# Patient Record
Sex: Male | Born: 1980 | Race: White | Hispanic: No | Marital: Single | Smoking: Current every day smoker
Health system: Southern US, Community
[De-identification: ages and names within clinical notes are randomized; demographics above are authoritative.]

## PROBLEM LIST (undated history)

## (undated) DIAGNOSIS — M549 Dorsalgia, unspecified: Secondary | ICD-10-CM

## (undated) DIAGNOSIS — R569 Unspecified convulsions: Secondary | ICD-10-CM

## (undated) DIAGNOSIS — K259 Gastric ulcer, unspecified as acute or chronic, without hemorrhage or perforation: Secondary | ICD-10-CM

## (undated) DIAGNOSIS — N200 Calculus of kidney: Secondary | ICD-10-CM

## (undated) DIAGNOSIS — F1121 Opioid dependence, in remission: Secondary | ICD-10-CM

## (undated) DIAGNOSIS — G8929 Other chronic pain: Secondary | ICD-10-CM

## (undated) DIAGNOSIS — F419 Anxiety disorder, unspecified: Secondary | ICD-10-CM

## (undated) DIAGNOSIS — F32A Depression, unspecified: Secondary | ICD-10-CM

## (undated) DIAGNOSIS — B1920 Unspecified viral hepatitis C without hepatic coma: Secondary | ICD-10-CM

## (undated) DIAGNOSIS — M25569 Pain in unspecified knee: Secondary | ICD-10-CM

## (undated) DIAGNOSIS — F329 Major depressive disorder, single episode, unspecified: Secondary | ICD-10-CM

## (undated) DIAGNOSIS — F1910 Other psychoactive substance abuse, uncomplicated: Secondary | ICD-10-CM

## (undated) DIAGNOSIS — G2581 Restless legs syndrome: Secondary | ICD-10-CM

## (undated) HISTORY — DX: Other psychoactive substance abuse, uncomplicated: F19.10

## (undated) HISTORY — DX: Restless legs syndrome: G25.81

## (undated) HISTORY — DX: Opioid dependence, in remission: F11.21

## (undated) HISTORY — DX: Depression, unspecified: F32.A

## (undated) HISTORY — DX: Anxiety disorder, unspecified: F41.9

## (undated) HISTORY — PX: WISDOM TOOTH EXTRACTION: SHX21

## (undated) HISTORY — DX: Major depressive disorder, single episode, unspecified: F32.9

## (undated) HISTORY — DX: Dorsalgia, unspecified: M54.9

## (undated) HISTORY — PX: HERNIA REPAIR: SHX51

---

## 2001-12-10 ENCOUNTER — Emergency Department (HOSPITAL_COMMUNITY): Admission: EM | Admit: 2001-12-10 | Discharge: 2001-12-10 | Payer: Self-pay | Admitting: Internal Medicine

## 2004-10-20 ENCOUNTER — Emergency Department (HOSPITAL_COMMUNITY): Admission: EM | Admit: 2004-10-20 | Discharge: 2004-10-21 | Payer: Self-pay | Admitting: *Deleted

## 2005-09-14 ENCOUNTER — Ambulatory Visit (HOSPITAL_COMMUNITY): Admission: RE | Admit: 2005-09-14 | Discharge: 2005-09-14 | Payer: Self-pay | Admitting: Family Medicine

## 2006-01-27 ENCOUNTER — Emergency Department (HOSPITAL_COMMUNITY): Admission: EM | Admit: 2006-01-27 | Discharge: 2006-01-27 | Payer: Self-pay | Admitting: Emergency Medicine

## 2006-11-08 ENCOUNTER — Emergency Department (HOSPITAL_COMMUNITY): Admission: EM | Admit: 2006-11-08 | Discharge: 2006-11-08 | Payer: Self-pay | Admitting: Emergency Medicine

## 2007-03-21 ENCOUNTER — Emergency Department (HOSPITAL_COMMUNITY): Admission: EM | Admit: 2007-03-21 | Discharge: 2007-03-21 | Payer: Self-pay | Admitting: Emergency Medicine

## 2007-08-13 ENCOUNTER — Emergency Department (HOSPITAL_COMMUNITY): Admission: EM | Admit: 2007-08-13 | Discharge: 2007-08-13 | Payer: Self-pay | Admitting: Emergency Medicine

## 2007-08-13 ENCOUNTER — Ambulatory Visit: Payer: Self-pay | Admitting: Psychiatry

## 2007-08-13 ENCOUNTER — Inpatient Hospital Stay (HOSPITAL_COMMUNITY): Admission: AD | Admit: 2007-08-13 | Discharge: 2007-08-15 | Payer: Self-pay | Admitting: Psychiatry

## 2007-08-16 ENCOUNTER — Emergency Department (HOSPITAL_COMMUNITY): Admission: EM | Admit: 2007-08-16 | Discharge: 2007-08-16 | Payer: Self-pay | Admitting: Emergency Medicine

## 2007-08-18 ENCOUNTER — Emergency Department (HOSPITAL_COMMUNITY): Admission: EM | Admit: 2007-08-18 | Discharge: 2007-08-18 | Payer: Self-pay | Admitting: Emergency Medicine

## 2008-03-10 ENCOUNTER — Emergency Department (HOSPITAL_COMMUNITY): Admission: EM | Admit: 2008-03-10 | Discharge: 2008-03-11 | Payer: Self-pay | Admitting: Emergency Medicine

## 2008-06-27 ENCOUNTER — Emergency Department (HOSPITAL_COMMUNITY): Admission: EM | Admit: 2008-06-27 | Discharge: 2008-06-27 | Payer: Self-pay | Admitting: Emergency Medicine

## 2009-06-14 ENCOUNTER — Emergency Department (HOSPITAL_COMMUNITY): Admission: EM | Admit: 2009-06-14 | Discharge: 2009-06-14 | Payer: Self-pay | Admitting: Emergency Medicine

## 2009-06-16 ENCOUNTER — Emergency Department (HOSPITAL_COMMUNITY): Admission: EM | Admit: 2009-06-16 | Discharge: 2009-06-16 | Payer: Self-pay | Admitting: Emergency Medicine

## 2009-07-15 ENCOUNTER — Emergency Department (HOSPITAL_COMMUNITY): Admission: EM | Admit: 2009-07-15 | Discharge: 2009-07-15 | Payer: Self-pay | Admitting: Emergency Medicine

## 2009-07-23 ENCOUNTER — Ambulatory Visit (HOSPITAL_COMMUNITY): Admission: RE | Admit: 2009-07-23 | Discharge: 2009-07-23 | Payer: Self-pay | Admitting: Family Medicine

## 2010-02-16 ENCOUNTER — Emergency Department (HOSPITAL_COMMUNITY): Admission: EM | Admit: 2010-02-16 | Discharge: 2010-02-16 | Payer: Self-pay | Admitting: Emergency Medicine

## 2010-02-22 ENCOUNTER — Emergency Department (HOSPITAL_COMMUNITY): Admission: EM | Admit: 2010-02-22 | Discharge: 2010-02-22 | Payer: Self-pay | Admitting: Emergency Medicine

## 2010-08-13 LAB — COMPREHENSIVE METABOLIC PANEL
ALT: 11 U/L (ref 0–53)
Albumin: 4 g/dL (ref 3.5–5.2)
Alkaline Phosphatase: 39 U/L (ref 39–117)
BUN: 10 mg/dL (ref 6–23)
CO2: 26 mEq/L (ref 19–32)
Chloride: 102 mEq/L (ref 96–112)
GFR calc non Af Amer: >60 mL/min (ref >60)
Sodium: 134 mEq/L — ABNORMAL LOW (ref 135–145)

## 2010-08-13 LAB — BASIC METABOLIC PANEL
Chloride: 103 mEq/L (ref 96–112)
GFR calc non Af Amer: >60 mL/min (ref >60)
Potassium: 4.6 mEq/L (ref 3.5–5.1)
Sodium: 139 mEq/L (ref 135–145)

## 2010-08-13 LAB — RAPID URINE DRUG SCREEN, HOSP PERFORMED: Benzodiazepines: POSITIVE — AB

## 2010-08-13 LAB — CBC
HCT: 40.5 % (ref 39.0–52.0)
Hemoglobin: 13.6 g/dL (ref 13.0–17.0)
MCH: 33.2 pg (ref 26.0–34.0)
MCV: 97 fL (ref 78.0–100.0)
Platelets: 263 10*3/uL (ref 150–400)
RBC: 4.1 MIL/uL — ABNORMAL LOW (ref 4.22–5.81)
RBC: 4.18 MIL/uL — ABNORMAL LOW (ref 4.22–5.81)
RDW: 12.3 % (ref 11.5–15.5)
WBC: 6.5 10*3/uL (ref 4.0–10.5)

## 2010-08-13 LAB — DIFFERENTIAL
Eosinophils Relative: 2 % (ref 0–5)
Lymphocytes Relative: 35 % (ref 12–46)
Lymphs Abs: 2.2 10*3/uL (ref 0.7–4.0)
Monocytes Relative: 8 % (ref 3–12)
Neutrophils Relative %: 55 % (ref 43–77)

## 2010-08-13 LAB — LIPASE, BLOOD: Lipase: 30 U/L (ref 11–59)

## 2010-08-13 LAB — ETHANOL: Alcohol, Ethyl (B): <5 mg/dL (ref 0–10)

## 2010-10-16 NOTE — Discharge Summary (Signed)
Andre Lynch, Andre Lynch NO.:  [id number]   MEDICAL RECORD NO.:  [id number]          PATIENT TYPE:  IPS   LOCATION:  0501                          FACILITY:  [facility]   PHYSICIAN:  Geoffery Lyons, M.D.      DATE OF BIRTH:  February 23, 1981   DATE OF ADMISSION:  08/13/2007  DATE OF DISCHARGE:  08/15/2007                               DISCHARGE SUMMARY   CHIEF COMPLAINT AND PRESENT ILLNESS:  This was the first admission to  [facility] for this 30 year old married male.  His  mother brought him to the ED after she found him passed out in the  bathroom of their home.  He told mom he felt like harming himself.  An  aunt had died, and he got very depressed, chronic back pain from  physical labor.   PAST PSYCHIATRIC HISTORY:  Time at Behavior Health.  February 2008 was  in [facility].  Has taken Seroquel in the past.   ALCOHOL AND DRUG HISTORY:  As already stated.  Was in [place], [place],  for detox treatment, cocaine and opiates.  He went from methadone to  Suboxone.   MEDICATIONS:  1. Alprazolam.  2. Vicodin.   PHYSICAL EXAMINATION:  Failed to show any acute findings.   LABORATORY WORK:  UDS positive for opiates and benzodiazepines, cocaine,  and marijuana.  CBC within normal limits.   MENTAL STATUS EXAMINATION:  Reveals an alert, cooperative male.  Speech  normal in rate, tempo, and production.  Mood depressed.  Affect  depressed.  Thought processes logical, coherent, and relevant.  No  evidence of delusions.  Endorsed suicidal thoughts but can contract for  safety.  No homicidal ideas.  No hallucinations.  Cognition well-  preserved.   ADMISSION DIAGNOSES:  AXIS I:  Polysubstance dependence, cocaine,  marijuana, heroin; depressive disorder, not otherwise specified.  AXIS II:  No diagnosis.  AXIS III:  No diagnosis.  AXIS IV: Moderate.  AVIS V:  GAF on admission 35, highest GAF in the last year 60.   COURSE IN THE HOSPITAL:  He was admitted,  started in individual and  group psychotherapy, was detoxified with Librium.  He was given some  Seroquel.  As already stated, aunt died, was using Xanax pain  medication, got really depressed, took more Xanax.  Endorsed generalized  anxiety and panic attacks; that is why he is prescribed the Xanax.  Endorsed back pain.  He was apparently on Cymbalta and then Lexapro and  claimed he was not trying to kill himself.  Lives with his wife.  Endorsed a good relationship.  Also has step kids, 51 and 57 years old.  Information from the mother suggested more extensive use than what he  cared to admit; cocaine, marijuana and heroin snorting.  On March 17, he  said that he wanted to leave.  Endorsed that he was now willing to be  open about his substance use.  Endorsed that he really wanted to get his  life back together.  He understands his wife would not immediately want  him back, and  that if he was to go back to his old behavior, the  relationship was going to probably be over.  There was a family session  with mother and wife.  He endorsed that he felt his detox was completed.  Was going to follow up in [facility].  Endorsed he plans to be compliant with medication.  Mother was clear  that this was the last time that she was going to take him in.  Mother  and wife were supportive of the fact that he needed to earn their trust,  as they did not trust him anymore, but overall, they seemed to be  supportive of his recovery.  He was in full contact with reality, no  active withdrawal, back on medications, so we went ahead and discharged  to outpatient follow-up.   DISCHARGE DIAGNOSES:  AXIS I:  Cocaine, marijuana, heroin abuse.  Depressive disorder, not otherwise specified.  Anxiety disorder, not  otherwise specified.  AXIS II:  No diagnosis.  AXIS III:  Chronic back pain.  AXIS IV:  Moderate.  AXIS V:  On discharge 55-60.   Discharged on Robaxin 750 mg 1 to 4 times  a day, ibuprofen 100 mg 3  times a day with food, DuoDerm patch 5% applied for 12 hours, and  Seroquel 200 three times a day.   FOLLOW-UP:  [facility].      Geoffery Lyons, M.D.  Electronically Signed     IL/MEDQ  D:  09/12/2007  T:  09/12/2007  Job:  [id number]

## 2011-02-22 LAB — BASIC METABOLIC PANEL
BUN: 10
BUN: 13
CO2: 28
CO2: 30
Calcium: 9.7
Chloride: 105
Creatinine, Ser: 1.1
GFR calc Af Amer: >60
GFR calc non Af Amer: >60
Glucose, Bld: 111 — ABNORMAL HIGH
Sodium: 140

## 2011-02-22 LAB — DIFFERENTIAL
Eosinophils Absolute: 0.2
Eosinophils Relative: 2
Lymphocytes Relative: 20
Lymphs Abs: 2.1
Lymphs Abs: 2.6
Monocytes Absolute: 0.8
Monocytes Relative: 7
Neutro Abs: 3.6
Neutrophils Relative %: 52

## 2011-02-22 LAB — HEPATIC FUNCTION PANEL
AST: 16
Albumin: 4
Bilirubin, Direct: 0.1
Total Protein: 6.9

## 2011-02-22 LAB — ETHANOL: Alcohol, Ethyl (B): <5

## 2011-02-22 LAB — RAPID URINE DRUG SCREEN, HOSP PERFORMED
Amphetamines: NOT DETECTED
Amphetamines: NOT DETECTED
Barbiturates: NOT DETECTED
Barbiturates: NOT DETECTED
Benzodiazepines: POSITIVE — AB
Cocaine: POSITIVE — AB
Opiates: POSITIVE — AB
Tetrahydrocannabinol: NOT DETECTED
Tetrahydrocannabinol: POSITIVE — AB

## 2011-02-22 LAB — CBC
HCT: 40.9
Hemoglobin: 14.2
MCHC: 34.8
MCV: 100.6 — ABNORMAL HIGH
MCV: 99.1
Platelets: 226
Platelets: 259
RBC: 4.07 — ABNORMAL LOW
RBC: 4.27
RDW: 12.3
WBC: 10.2
WBC: 6.9

## 2011-02-22 LAB — ACETAMINOPHEN LEVEL: Acetaminophen (Tylenol), Serum: <10 — ABNORMAL LOW

## 2011-06-13 ENCOUNTER — Ambulatory Visit: Payer: Self-pay

## 2011-06-13 DIAGNOSIS — R55 Syncope and collapse: Secondary | ICD-10-CM

## 2011-06-13 DIAGNOSIS — F411 Generalized anxiety disorder: Secondary | ICD-10-CM

## 2011-11-19 ENCOUNTER — Other Ambulatory Visit: Payer: Self-pay | Admitting: Family Medicine

## 2011-12-15 ENCOUNTER — Other Ambulatory Visit: Payer: Self-pay | Admitting: Family Medicine

## 2012-01-01 ENCOUNTER — Emergency Department (HOSPITAL_COMMUNITY)
Admission: EM | Admit: 2012-01-01 | Discharge: 2012-01-01 | Disposition: A | Discharge status: Home / Self Care | Payer: Self-pay | Attending: Emergency Medicine | Admitting: Emergency Medicine

## 2012-01-01 ENCOUNTER — Encounter (HOSPITAL_COMMUNITY): Payer: Self-pay | Admitting: *Deleted

## 2012-01-01 DIAGNOSIS — F17200 Nicotine dependence, unspecified, uncomplicated: Secondary | ICD-10-CM | POA: Insufficient documentation

## 2012-01-01 DIAGNOSIS — R229 Localized swelling, mass and lump, unspecified: Secondary | ICD-10-CM | POA: Insufficient documentation

## 2012-01-01 DIAGNOSIS — M25539 Pain in unspecified wrist: Secondary | ICD-10-CM

## 2012-01-01 DIAGNOSIS — M6740 Ganglion, unspecified site: Secondary | ICD-10-CM

## 2012-01-01 MED ORDER — OXYCODONE-ACETAMINOPHEN 5-325 MG PO TABS: 1.0000 | ORAL_TABLET | ORAL | Status: AC | PRN

## 2012-01-01 MED ORDER — LIDOCAINE HCL (PF) 1 % IJ SOLN
5.0000 mL | Freq: Once | INTRAMUSCULAR | Status: AC
  Administered 2012-01-01: 19:00:00

## 2012-01-01 MED ORDER — LIDOCAINE HCL (PF) 1 % IJ SOLN
INTRAMUSCULAR | Status: AC
  Filled 2012-01-01: qty 5

## 2012-01-01 NOTE — ED Notes (Signed)
Left wrist fx, seen at urgent care. Abscess to left hand also which urgent care doctor told pt it may need to be lanced. NAD.

## 2012-01-01 NOTE — ED Provider Notes (Signed)
History     CSN: [id number]  Arrival date & time 01/01/12  0865   First MD Initiated Contact with Patient 01/01/12 1834      Chief Complaint  Patient presents with  . Wrist Pain    (Consider location/radiation/quality/duration/timing/severity/associated sxs/prior treatment) HPI Comments: Patient c/o pain to his left wrist for one week.  States the pain began when a lawn mower he was working on slipped and fell on his wrist.  C/o pain to the medial wrist and also had a small , firm nodule appear on his left hand at the same time as the injury.  States he was seen at a local urgent are and told he had a wrist fracture and the nodule was likely an abscess.  States he has been taking doxycycline and Relafen but symptoms are not improving.  He denies swelling of his hand, fever, chills, numbness or weakness.    Patient is a 31 y.o. male presenting with wrist pain. The history is provided by the patient.  Wrist Pain This is a new problem. The current episode started in the past 7 days. The problem occurs constantly. The problem has been gradually worsening. Associated symptoms include arthralgias. Pertinent negatives include no chills, fever, joint swelling, neck pain, numbness, rash, sore throat or weakness. The symptoms are aggravated by bending. He has tried NSAIDs (splinting) for the symptoms. The treatment provided no relief.    History reviewed. No pertinent past medical history.  Past Surgical History  Procedure Date  . Hernia repair     No family history on file.  History  Substance Use Topics  . Smoking status: Current Everyday Smoker  . Smokeless tobacco: Not on file  . Alcohol Use: Yes     rarely      Review of Systems  Constitutional: Negative for fever and chills.  HENT: Negative for sore throat and neck pain.   Genitourinary: Negative for dysuria and difficulty urinating.  Musculoskeletal: Positive for arthralgias. Negative for joint swelling.       Left wrist  pain  Skin: Negative for color change and rash.       Nodule to the left hand  Neurological: Negative for weakness and numbness.  All other systems reviewed and are negative.    Allergies  Review of patient's allergies indicates no known allergies.  Home Medications   Current Outpatient Rx  Name Route Sig Dispense Refill  . ALPRAZOLAM 1 MG PO TABS  TAKE (1) TABLET BY MOUTH (3) TIMES DAILY. 90 tablet 0  . KLONOPIN 1 MG PO TABS  TAKE (1) TABLET BY MOUTH AT BEDTIME. 30 each 0    BP 124/73  Pulse 67  Temp 97.8 F (36.6 C) (Oral)  Resp 16  SpO2 100%  Physical Exam  Nursing note and vitals reviewed. Constitutional: He is oriented to person, place, and time. He appears well-developed and well-nourished. No distress.  HENT:  Head: Normocephalic and atraumatic.  Cardiovascular: Normal rate, regular rhythm and normal heart sounds.   Pulmonary/Chest: Effort normal and breath sounds normal.  Musculoskeletal: He exhibits tenderness. He exhibits no edema.       Left wrist: He exhibits decreased range of motion, tenderness and bony tenderness. He exhibits no swelling, no effusion, no crepitus, no deformity and no laceration.       Arms:      ttp of the medial left wrist.  Radial pulse is brisk, sensation intact.  CR< 2 sec.  No bruising or deformity of the wrist.  Pt also has a 2 cm firm, well defined nodule to the dorsal left hand overlying the tendon of the fourth finger.  No fluctuance, surrounding erythema or drainage.   Neurological: He is alert and oriented to person, place, and time. He exhibits normal muscle tone. Coordination normal.  Skin: Skin is warm and dry.    ED Course  Procedures (including critical care time)  Labs Reviewed - No data to display      MDM  Nodule was further evaluated, dorsal left hand was cleaned with saline and betadine.  Local anesthesia obtained with 1% plain lidocaine, 1ml.  Needle aspiration with an 18g needle, no aspirate .  Bandaged applied.      No recent imaging in EPIC.  Firm slightly erythematous nodule to the dorsal left hand, likely a ganglion cyst.  Pt prefers to f/u with Dr. Hilda Lias.  Has a velcro wrist splint with him.   Pt was also seen by the EDP and care plan discussed.    The patient appears reasonably screened and/or stabilized for discharge and I doubt any other medical condition or other [facility] requiring further screening, evaluation, or treatment in the ED at this time prior to discharge.      Anabelen Kaminsky L. [place], [place] 01/01/12 1944

## 2012-01-01 NOTE — ED Notes (Signed)
Pt with knot to back of left hand, states it came up after a lawnmower fell on his hand and left wrist, was seen at Urgent Care this past Tuesday and was dx with left wrist fx, swelling noted to medial left wrist

## 2012-01-01 NOTE — ED Notes (Signed)
T. Triplett, PA at bedside. 

## 2012-01-02 NOTE — ED Provider Notes (Signed)
Medical screening examination/treatment/procedure(s) were performed by non-physician practitioner and as supervising physician I was immediately available for consultation/collaboration.  Shaquisha Wynn R. Mekiyah Gladwell, MD 01/02/12 2359 

## 2012-01-03 MED FILL — Oxycodone w/ Acetaminophen Tab 5-325 MG: ORAL | Qty: 6 | Status: AC

## 2012-01-15 ENCOUNTER — Other Ambulatory Visit: Payer: Self-pay | Admitting: *Deleted

## 2012-01-15 MED ORDER — ALPRAZOLAM 1 MG PO TABS: 1.0000 mg | ORAL_TABLET | Freq: Three times a day (TID) | ORAL | Status: DC

## 2012-02-11 ENCOUNTER — Other Ambulatory Visit: Payer: Self-pay | Admitting: Physician Assistant

## 2012-02-11 NOTE — Telephone Encounter (Signed)
Please pull chart.

## 2012-02-11 NOTE — Telephone Encounter (Signed)
Patient's chart is at the nurses station in the pa pool pile. [id number] [id number]

## 2012-03-17 ENCOUNTER — Ambulatory Visit: Payer: Self-pay | Admitting: Physician Assistant

## 2012-03-17 VITALS — BP 122/78 | HR 70 | Temp 97.8°F | Resp 16 | Ht 68.5 in | Wt 130.6 lb

## 2012-03-17 DIAGNOSIS — F419 Anxiety disorder, unspecified: Secondary | ICD-10-CM

## 2012-03-17 DIAGNOSIS — F411 Generalized anxiety disorder: Secondary | ICD-10-CM

## 2012-03-17 MED ORDER — ALPRAZOLAM 1 MG PO TABS: 1.0000 mg | ORAL_TABLET | Freq: Three times a day (TID) | ORAL | Status: DC | PRN

## 2012-03-17 NOTE — Progress Notes (Signed)
  Subjective:    Patient ID: Andre Lynch, male    DOB: 22-Feb-1981, 31 y.o.   MRN: [id number]  HPI 31 year old male presents for recheck of anxiety.  He is taking Xanax tid prn anxiety. States this is working well and he is happy with the results.  Does admit he has taken multiple anti-depressants in the past, including Wellbutrin, Prozac, Celexa, and Zoloft. None of these have helped him at all, in fact they have made him feel "weird" and "not himself."  Wishes to stay on this regimen.  Understands that he needs to come in every 6 months for rechecks. 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  Thankful to have a job but does admit to it causing him anxiety.     Review of Systems  Psychiatric/Behavioral: Negative for suicidal ideas, hallucinations, behavioral problems, confusion and agitation. The patient is nervous/anxious.   All other systems reviewed and are negative.       Objective:   Physical Exam  Constitutional: He is oriented to person, place, and time. He appears well-developed and well-nourished.  HENT:  Head: Normocephalic and atraumatic.  Right Ear: External ear normal.  Left Ear: External ear normal.  Eyes: Conjunctivae normal are normal.  Neck: Normal range of motion.  Cardiovascular: Normal rate, regular rhythm and normal heart sounds.   Pulmonary/Chest: Effort normal and breath sounds normal.  Neurological: He is alert and oriented to person, place, and time.  Psychiatric: He has a normal mood and affect. His behavior is normal. Judgment and thought content normal.          Assessment & Plan:   1. Anxiety    Xanax 1 mg tid prn. Encouraged to use as sparingly as possible Follow up in 6 months.

## 2012-04-22 ENCOUNTER — Telehealth: Payer: Self-pay

## 2012-04-22 NOTE — Telephone Encounter (Signed)
Pt states that pharmacy has tried to order a refill on xanax, but has not heard back. Pt is requesting refill for xanax.  Sorry, but i forgot to get the pharmacy name.  [phone]

## 2012-04-23 NOTE — Telephone Encounter (Signed)
Last time, he used [address] in [place].

## 2012-04-24 MED ORDER — ALPRAZOLAM 1 MG PO TABS: 1.0000 mg | ORAL_TABLET | Freq: Three times a day (TID) | ORAL | Status: DC | PRN

## 2012-04-24 NOTE — Telephone Encounter (Signed)
Faxed Rx to pharmacy and notified pt.

## 2012-04-24 NOTE — Telephone Encounter (Signed)
Rx done and ready to be sent to pharmacy 

## 2012-05-18 ENCOUNTER — Telehealth: Payer: Self-pay | Admitting: *Deleted

## 2012-05-18 MED ORDER — ALPRAZOLAM 1 MG PO TABS: 1.0000 mg | ORAL_TABLET | Freq: Three times a day (TID) | ORAL | Status: DC | PRN

## 2012-05-18 NOTE — Telephone Encounter (Signed)
Rx done. 

## 2012-05-18 NOTE — Telephone Encounter (Signed)
Pharmacy requesting a refill on Xanax 1mg  tab. Last refill on 04/24/12.

## 2012-05-19 ENCOUNTER — Other Ambulatory Visit: Payer: Self-pay

## 2012-06-26 ENCOUNTER — Telehealth: Payer: Self-pay

## 2012-06-26 NOTE — Telephone Encounter (Signed)
[facility] requesting refill on alprazolam 1mg . Last fill 05/23/12.

## 2012-06-26 NOTE — Telephone Encounter (Signed)
Pharmacist CB to get approval for RF.  Herbert Seta do you want to RF pt's xanax? I have pended it for your approval.

## 2012-06-26 NOTE — Telephone Encounter (Signed)
Patient is checking on status of his xanax refills   [phone]

## 2012-06-27 MED ORDER — ALPRAZOLAM 1 MG PO TABS: 1.0000 mg | ORAL_TABLET | Freq: Three times a day (TID) | ORAL | Status: DC | PRN

## 2012-06-27 NOTE — Telephone Encounter (Signed)
Rx done and ready to fax.

## 2012-06-28 NOTE — Telephone Encounter (Signed)
PT NOTIFIED THAT RX WAS FAXED TO [facility]

## 2012-07-03 ENCOUNTER — Telehealth: Payer: Self-pay | Admitting: Radiology

## 2012-07-03 NOTE — Telephone Encounter (Signed)
[facility] [place] advised on this at least since 2010. Called Andre Lynch. FYI/ patient is in detox program for this.

## 2012-07-03 NOTE — Telephone Encounter (Signed)
Detox facility is asking how long patient has been taking the Alprazolam. Call [phone] Jasmine December. Detox nurses station.

## 2012-07-27 ENCOUNTER — Ambulatory Visit: Payer: Self-pay | Admitting: Family Medicine

## 2012-07-27 ENCOUNTER — Encounter: Payer: Self-pay | Admitting: Physician Assistant

## 2012-07-27 VITALS — BP 117/69 | HR 81 | Temp 98.0°F | Resp 16 | Ht 69.0 in | Wt 142.0 lb

## 2012-07-27 DIAGNOSIS — F1911 Other psychoactive substance abuse, in remission: Secondary | ICD-10-CM

## 2012-07-27 DIAGNOSIS — F419 Anxiety disorder, unspecified: Secondary | ICD-10-CM

## 2012-07-27 DIAGNOSIS — F411 Generalized anxiety disorder: Secondary | ICD-10-CM

## 2012-07-27 MED ORDER — ALPRAZOLAM 1 MG PO TABS: 1.0000 mg | ORAL_TABLET | Freq: Three times a day (TID) | ORAL | Status: DC | PRN

## 2012-07-27 NOTE — Progress Notes (Signed)
Subjective:    Patient ID: Andre Lynch, male    DOB: March 21, 1981, 32 y.o.   MRN: [id number]  HPI  A 32 year old male presents for follow up of anxiety and refill of xanax.    Pt was discharged from [facility] [place] yesterday where he spent the past 28 days in a detox program for opioids.  [place] discharge papers show the patient taking METHADONE, klonopin, and trazadone while there.  Pt states he took METHADONE consistently but klonopin and trazadone only as needed.  He reports the clonazepam wasn't helpful at all and the trazadone only minimally so.  Pt has daily support groups he attends for addiction and has a plan in place incase he feels the urge to relapse.  His discharge papers indicate an appointment at [facility] today at 5 am for Methadone, and 2 pm today here.He reports that upon his arrival at [facility] this morning, but he was not seen and did not get methadone because there was no provider there.  [facility] is unable to see him until August 01, 2012. He's wondering if a pain management clinic would be a better choice for him, and asks for a referral.  Pt has taken "xanax 1 mg TID for the last 6-7 years" which relieves his anxiety and gives him no side effects.  He has tried several anxiolytics in the past but discontinued them due to side effects (buspar, clonazepam, trazadone, paxil, elavil).  Pt is able to function normally at work and does not feel drowsy throughout the day.  He denies trouble sleeping, SOB, CP, n/v, f/c.  [place] Controlled substance database is aligned with the patient's report.  His record here, both paper and electronic is reviewed.    Past Medical History  Diagnosis Date  . History of narcotic addiction     methadone tx  . Back pain   . RLS (restless legs syndrome)     Past Surgical History  Procedure Laterality Date  . Hernia repair    . Wisdom tooth extraction      Prior to Admission medications : Methadone (last dose  yesterday)    No Known Allergies  History   Social History  . Lives alone    Occupational History  . Licensed conveyancer; Farm[employer]   Social History Main Topics  . Smoking status: Current Every Day Smoker  . Alcohol Use: Yes     Comment: rarely  . Drug Use: Not currently   Family History  Problem Relation Age of Onset  . Cancer Mother     breast cancer survivor    Review of Systems   As above.  Objective:   Physical Exam  BP 117/69  Pulse 81  Temp(Src) 98 F (36.7 C)  Resp 16  Ht 5\' 9"  (1.753 m)  Wt 142 lb (64.411 kg)  BMI 20.96 kg/m2  General:  Pleasant, thin male.  NAD. Accompained by a male friend. HEENT:  PERRLA, EOMs intact.  Conjunctiva clear. Peripheral vascular:  2+ bilateral radial pulses. MSK:  2+ bilateral BR, bicep, patellar reflexes. Neuro:  A&Ox3.  Cranial nerves intact. Psych:  Normal mood and affect. Heart:  RRR.  Normal S1,S2.  No m/g/r. Lungs:  CTAB. Skin:  Warm and moist.  No rashes.     Assessment & Plan:  Anxiety - Plan: ALPRAZolam (XANAX) 1 MG tablet  Patient Instructions  Continue to go to support group meetings.  You can call in 1 month for a refill  of your xanax, but you will need to follow up with [place] in 2 months.  Contact us with the name of the pain clinic you would like [place] to refer you to.

## 2012-07-27 NOTE — Patient Instructions (Signed)
Continue to go to support group meetings.  You can call in 1 month for a refill of your xanax, but you will need to follow up with [place] in 2 months.  Contact us with the name of the pain clinic you would like [place] to refer you to.

## 2012-07-28 ENCOUNTER — Emergency Department (HOSPITAL_COMMUNITY)
Admission: EM | Admit: 2012-07-28 | Discharge: 2012-07-28 | Disposition: A | Discharge status: Home / Self Care | Payer: Self-pay

## 2012-07-28 ENCOUNTER — Telehealth: Payer: Self-pay

## 2012-07-28 ENCOUNTER — Encounter: Payer: Self-pay | Admitting: Family Medicine

## 2012-07-28 DIAGNOSIS — F1911 Other psychoactive substance abuse, in remission: Secondary | ICD-10-CM | POA: Insufficient documentation

## 2012-07-28 DIAGNOSIS — F411 Generalized anxiety disorder: Secondary | ICD-10-CM | POA: Insufficient documentation

## 2012-07-28 NOTE — Progress Notes (Signed)
I have examined this patient along with the student and agree.  

## 2012-07-28 NOTE — Telephone Encounter (Signed)
PT WAS SEEN TODAY BY CHELLE AND SHE KEPT SOME PAPERWORK THAT PATIENT NEEDS FAXED BACK TO [phone] A DISCHARGE SUMMARY  BEST NUMBER IS [phone]

## 2012-07-28 NOTE — ED Notes (Signed)
No answer

## 2012-07-31 NOTE — Telephone Encounter (Signed)
He needs his dosing schedule for his Methadone. He states he gave it to the doctor now he needs this. Please advise.

## 2012-07-31 NOTE — Telephone Encounter (Signed)
Will forward message to [employer], PA-C.

## 2012-08-01 NOTE — Telephone Encounter (Signed)
I do not have these. His voice mail is not set up yet unable to leave message.

## 2012-08-01 NOTE — Telephone Encounter (Signed)
The papers he gave me were marked for scanning.  Please pursue.  Copy the papers for [place] to scan, and return the originals to the patient.  If we are unable to locate them, please call the center where he received his treatment in [place] and ask for a copy.

## 2012-08-02 NOTE — Telephone Encounter (Signed)
Voice mail box  Not set up yet. Have looked for these, unable to locate. He should check with [place] treatment center to get this information

## 2012-08-24 ENCOUNTER — Telehealth: Payer: Self-pay

## 2012-08-24 DIAGNOSIS — F419 Anxiety disorder, unspecified: Secondary | ICD-10-CM

## 2012-08-24 NOTE — Telephone Encounter (Signed)
PATIENT WOULD LIKE TO KNOW IF A XANAX RX REQUEST  HAS COME TO OUR OFFICE. PATIENT USES [facility] IN [place].   BEST CALL BACK #: [phone]

## 2012-08-24 NOTE — Telephone Encounter (Signed)
Patient evaluated by Chelle Jeffery, PA-C. Will forward message. 

## 2012-08-24 NOTE — Telephone Encounter (Signed)
Please advise on renewal of Xanax. pended

## 2012-08-25 MED ORDER — ALPRAZOLAM 1 MG PO TABS: 1.0000 mg | ORAL_TABLET | Freq: Three times a day (TID) | ORAL | Status: DC | PRN

## 2012-08-25 NOTE — Telephone Encounter (Signed)
Called patient to advise. Patient states he is going to methadone clinic, [facility]. He states he does not want to pursue pain management clinic at this time, he states he is getting comfortable with his methadone clinic first, with getting dose to where he feels like it needs to be, then he states he will try to get into pain clinic. He plans to come in to discuss this in next month.

## 2012-08-25 NOTE — Telephone Encounter (Signed)
rx printed.  He was to contact us with the name of the pain management center he preferred, but I don't see that he did.  Is he going to the methadone clinic? Also, remind him he'll need an OV for the next prescription.

## 2012-09-22 ENCOUNTER — Telehealth: Payer: Self-pay

## 2012-09-22 NOTE — Telephone Encounter (Signed)
No, he needs office visit for refills, per Chelle. Called him to advise. Voice mail not set up yet.

## 2012-09-22 NOTE — Telephone Encounter (Signed)
Patient is requesting refill on his xanex if possible [facility] [place] please call patient if needed at [phone]

## 2012-09-22 NOTE — Telephone Encounter (Signed)
Pt aware he will need to come into the office for a refill. He states he is now out of state and will go to a local Urgent care for his medication. He is in the process of getting set up with a local dr.

## 2012-10-03 ENCOUNTER — Telehealth: Payer: Self-pay

## 2012-10-03 NOTE — Telephone Encounter (Signed)
Patient seen 07/2012 by Porfirio Oar, PA-C.  Will forward message to her.

## 2012-10-03 NOTE — Telephone Encounter (Signed)
1. If he has had a seizure, he needs to go to the Emergency Department where he is. We have never evaluated him for, nor treated him for, a seizure disorder. I prescribed alprazolam for generalized anxiety disorder.  He has been seen here ONE time, in February 2014.  He was told he could call for a refill of alprazolam x 1 (which he did) and that he'd need an OV for additional evaluation.  He was advised of same x 2 (when he called for the refill in 07/2012 and again when he called to request a refill last month).  He was to be going to the methadone clinic ([facility]) for substance abuse treatment.  2. Where does he live now?  3. Why is he not able to find a physician where he is?

## 2012-10-03 NOTE — Telephone Encounter (Signed)
Pt has moved and can not get a physician where he lives and needing to talk with dr Katrinka Blazing about his seizure medication and has had a seizure  Best number [phone]

## 2012-10-03 NOTE — Telephone Encounter (Signed)
See phone messages, patient has been advised multiple times to return to clinic for his Alprazolam rx.

## 2012-10-04 ENCOUNTER — Telehealth: Payer: Self-pay

## 2012-10-04 NOTE — Telephone Encounter (Signed)
See phone message from yesterday 

## 2012-10-04 NOTE — Telephone Encounter (Signed)
PATIENT WANTS REFILL XANAX AND HAS MOVED TO [place]. [phone]

## 2012-10-04 NOTE — Telephone Encounter (Signed)
PATIENT WANTS REFILL XANAX AND HAS MOVED TO [place]. [phone]   Called at new number given. He states he is living in [place]. He now states he did not have a siezure. He states he had bad panic attack. He states he can not find a doctor. He states he is going to try to find one in [place]. He is advised we can not renew the Xanax. To you FYI

## 2012-10-04 NOTE — Telephone Encounter (Signed)
Called him to advise. No answer voice mail not set up.

## 2012-10-13 ENCOUNTER — Telehealth: Payer: Self-pay

## 2012-10-13 NOTE — Telephone Encounter (Signed)
Not without office visit. Called him to advise. To ER for seizures, he stated last week he had one, was advised to go to ER

## 2012-10-13 NOTE — Telephone Encounter (Signed)
PT says he is out of his xanax.  He does not have insurance and will not get paid until next Thursday.  Says he has seizures without his xanax.  Can we call him in a week's supply? [phone]

## 2012-11-05 ENCOUNTER — Ambulatory Visit: Payer: Self-pay

## 2012-11-06 ENCOUNTER — Emergency Department (HOSPITAL_COMMUNITY)
Admission: EM | Admit: 2012-11-06 | Discharge: 2012-11-06 | Discharge status: Against Medical Advice | Payer: Self-pay | Attending: Emergency Medicine | Admitting: Emergency Medicine

## 2012-11-06 ENCOUNTER — Emergency Department (HOSPITAL_COMMUNITY): Payer: Self-pay

## 2012-11-06 ENCOUNTER — Encounter (HOSPITAL_COMMUNITY): Payer: Self-pay | Admitting: Emergency Medicine

## 2012-11-06 DIAGNOSIS — M549 Dorsalgia, unspecified: Secondary | ICD-10-CM

## 2012-11-06 DIAGNOSIS — Z79899 Other long term (current) drug therapy: Secondary | ICD-10-CM | POA: Insufficient documentation

## 2012-11-06 DIAGNOSIS — IMO0002 Reserved for concepts with insufficient information to code with codable children: Secondary | ICD-10-CM | POA: Insufficient documentation

## 2012-11-06 DIAGNOSIS — M542 Cervicalgia: Secondary | ICD-10-CM

## 2012-11-06 DIAGNOSIS — Y9389 Activity, other specified: Secondary | ICD-10-CM | POA: Insufficient documentation

## 2012-11-06 DIAGNOSIS — S0993XA Unspecified injury of face, initial encounter: Secondary | ICD-10-CM | POA: Insufficient documentation

## 2012-11-06 DIAGNOSIS — R32 Unspecified urinary incontinence: Secondary | ICD-10-CM | POA: Insufficient documentation

## 2012-11-06 DIAGNOSIS — R569 Unspecified convulsions: Secondary | ICD-10-CM | POA: Insufficient documentation

## 2012-11-06 DIAGNOSIS — F1911 Other psychoactive substance abuse, in remission: Secondary | ICD-10-CM

## 2012-11-06 DIAGNOSIS — W19XXXA Unspecified fall, initial encounter: Secondary | ICD-10-CM

## 2012-11-06 DIAGNOSIS — F17200 Nicotine dependence, unspecified, uncomplicated: Secondary | ICD-10-CM | POA: Insufficient documentation

## 2012-11-06 DIAGNOSIS — Y92009 Unspecified place in unspecified non-institutional (private) residence as the place of occurrence of the external cause: Secondary | ICD-10-CM | POA: Insufficient documentation

## 2012-11-06 DIAGNOSIS — W108XXA Fall (on) (from) other stairs and steps, initial encounter: Secondary | ICD-10-CM | POA: Insufficient documentation

## 2012-11-06 DIAGNOSIS — Z8669 Personal history of other diseases of the nervous system and sense organs: Secondary | ICD-10-CM | POA: Insufficient documentation

## 2012-11-06 HISTORY — DX: Unspecified convulsions: R56.9

## 2012-11-06 NOTE — ED Provider Notes (Signed)
History     CSN: [id number]  Arrival date & time 11/06/12  2005   First MD Initiated Contact with Patient 11/06/12 2300      Chief Complaint  Patient presents with  . Seizures     HPI Patient reports a history of intermittent seizures.  He's never been seen by neurologist.  Today he states he was at the top of the flight of stairs when he reportedly had a seizure that was tonic-clonic and resolved at home.  He presented emergency department complaining of back and neck pain.  He states he takes anywhere from 15-20 Xanax a week and that these are prescribed for him.  He states is currently been out of his Xanax for one week because of refill issues with his primary care physician.  He denies abdominal pain or chest pain.  No shortness of breath.  No headache.  He denies use of anticoagulants.  His symptoms are mild to moderate in severity.  He reports urinary incontinence without tongue biting.  No other complaints.   Past Medical History  Diagnosis Date  . History of narcotic addiction     methadone tx  . Back pain   . RLS (restless legs syndrome)   . Seizures     Past Surgical History  Procedure Laterality Date  . Hernia repair    . Wisdom tooth extraction      Family History  Problem Relation Age of Onset  . Cancer Mother     breast cancer survivor    History  Substance Use Topics  . Smoking status: Current Every Day Smoker  . Smokeless tobacco: Not on file  . Alcohol Use: Yes     Comment: rarely      Review of Systems  All other systems reviewed and are negative.    Allergies  Ibuprofen and Tylenol  Home Medications   Current Outpatient Rx  Name  Route  Sig  Dispense  Refill  . ALPRAZolam (XANAX) 1 MG tablet   Oral   Take 1 tablet (1 mg total) by mouth 3 (three) times daily as needed for sleep.   90 tablet   0     BP 112/61  Pulse 103  Temp(Src) 98.7 F (37.1 C) (Oral)  Resp 20  Ht 5\' 8"  (1.727 m)  Wt 130 lb (58.968 kg)  BMI 19.77 kg/m2   SpO2 100%  Physical Exam  Nursing note and vitals reviewed. Constitutional: He is oriented to person, place, and time. He appears well-developed and well-nourished.  HENT:  Head: Normocephalic and atraumatic.  No tongue bite marks  Eyes: EOM are normal. Pupils are equal, round, and reactive to light.  Neck: Neck supple.  Mild cervical and paracervical tenderness without cervical step-offs.  C-spine immobilized in cervical collar.  Cardiovascular: Normal rate, regular rhythm, normal heart sounds and intact distal pulses.   Pulmonary/Chest: Effort normal and breath sounds normal. No respiratory distress. He exhibits no tenderness.  Abdominal: Soft. He exhibits no distension. There is no tenderness.  Genitourinary: Rectum normal.  Musculoskeletal: Normal range of motion.  Thoracic and parathoracic tenderness without thoracic step-off.  No lumbar tenderness.  Neurological: He is alert and oriented to person, place, and time.  5/5 strength in major muscle groups of  bilateral upper and lower extremities. Speech normal. No facial asymetry.  Reported ambulatory in the emergency department  Skin: Skin is warm and dry.  Psychiatric: He has a normal mood and affect. Judgment normal.    ED Course  Procedures (including critical care time)  Labs Reviewed - No data to display No results found.   1. Seizure   2. H/O: substance abuse   3. Neck pain   4. Back pain   5. Fall, initial encounter       MDM  Cervical and thoracic films were ordered.  The patient's pain medications were ordered.  The patient was noted by nursing staff to remove his own cervical collar and walk out of the emergency department without informing staff.  I did not have a chance to give the patient his AMA instructions        Lyanne Co, MD 11/07/12 [phone]

## 2012-11-06 NOTE — ED Notes (Signed)
Pt left AMA, did not sign

## 2012-11-06 NOTE — ED Notes (Signed)
Patient states he had a seizure about 45 minutes ago and fell down a flight of stairs. Complaining of back and right knee pain. States he has a history of seizures but is out of his medication x 1 week. Patient alert and oriented at triage.

## 2012-11-06 NOTE — ED Notes (Addendum)
Pt states he has been on xanax TID for 10 years for seizure controll, has been out of med for 1 week

## 2012-11-06 NOTE — ED Notes (Signed)
Pt witnessed leaving ER after EDP informed him he would not receive narcotics or benzodiazepines.

## 2012-12-31 ENCOUNTER — Ambulatory Visit: Payer: Self-pay | Admitting: Family Medicine

## 2012-12-31 VITALS — BP 102/84 | HR 78 | Temp 98.6°F | Resp 16 | Ht 68.5 in | Wt 136.0 lb

## 2012-12-31 DIAGNOSIS — F411 Generalized anxiety disorder: Secondary | ICD-10-CM

## 2012-12-31 DIAGNOSIS — Z87898 Personal history of other specified conditions: Secondary | ICD-10-CM

## 2012-12-31 DIAGNOSIS — Z8669 Personal history of other diseases of the nervous system and sense organs: Secondary | ICD-10-CM

## 2012-12-31 DIAGNOSIS — F1911 Other psychoactive substance abuse, in remission: Secondary | ICD-10-CM

## 2012-12-31 NOTE — Progress Notes (Signed)
[facility]:  Office Visit  Chief Complaint:  Chief Complaint  Patient presents with  . Medication Refill    Xanax  . Knee Pain    Right x 2 dys  Chronic    HPI: Andre Lynch is a 32 y.o. male who complains of: 1. Has acute on chronic pain in right knee and has seen ortho Dr. Pearletha Furl , he has had pain and pressure in the middle of the knee x 2 days . Throbs most of the time and has occ sharp pain, it is hard for him to sleep. He took some naproxen and ibupofren without relief. Has shooting pain every once in a while. He works  2. Refill for Xanax, he has been getting it here for several years, he last took Xanax 1 week ago. He has been clean since January, rehab in January.  3.  His last seizure was in February or March, prior to this he had one 1.5 years before that. It was witnessed by his father and the ambulance , his hands would get numb and he had sxs  Patient adamently denies to me that he has had any illicit drugs,  Opiates/pain pills or benzos. Last benzo was last week.   Past Medical History  Diagnosis Date  . History of narcotic addiction     methadone tx  . Back pain   . RLS (restless legs syndrome)   . Seizures   . Substance abuse   . Anxiety   . Depression    Past Surgical History  Procedure Laterality Date  . Hernia repair    . Wisdom tooth extraction     History   Social History  . Marital Status: Divorced    Spouse Name: N/A    Number of Children: N/A  . Years of Education: N/A   Social History Main Topics  . Smoking status: Current Every Day Smoker  . Smokeless tobacco: None  . Alcohol Use: Yes     Comment: rarely  . Drug Use: None  . Sexually Active: None   Other Topics Concern  . None   Social History Narrative  . None   Family History  Problem Relation Age of Onset  . Cancer Mother     breast cancer survivor   Allergies  Allergen Reactions  . Ibuprofen Other (See Comments)    Cannot take due to stomach ulcers   . Tylenol (Acetaminophen) Other (See Comments)    Cannot take due to stomach ulcers   Prior to Admission medications   Medication Sig Start Date End Date Taking? Authorizing Provider  ALPRAZolam Prudy Feeler) 1 MG tablet Take 1 tablet (1 mg total) by mouth 3 (three) times daily as needed for sleep. 08/24/12  Yes Chelle S Jeffery, PA-C     ROS: The patient denies fevers, chills, night sweats, unintentional weight loss, chest pain, palpitations, wheezing, dyspnea on exertion, nausea, vomiting, abdominal pain, dysuria, hematuria, melena, numbness, weakness, or tingling.   All other systems have been reviewed and were otherwise negative with the exception of those mentioned in the HPI and as above.    PHYSICAL EXAM: Filed Vitals:   12/31/12 1542  BP: 102/84  Pulse: 78  Temp: 98.6 F (37 C)  Resp: 16   Filed Vitals:   12/31/12 1542  Height: 5' 8.5" (1.74 m)  Weight: 136 lb (61.689 kg)   Body mass index is 20.38 kg/(m^2).  General: Alert, no acute distress, thin young male HEENT:  Normocephalic,  atraumatic, oropharynx patent.  Cardiovascular:  Regular rate and rhythm, no rubs murmurs or gallops.  No Carotid bruits, radial pulse intact. No pedal edema.  Respiratory: Clear to auscultation bilaterally.  No wheezes, rales, or rhonchi.  No cyanosis, no use of accessory musculature GI: No organomegaly, abdomen is soft and non-tender, positive bowel sounds.  No masses. Skin: No rashes. Neurologic: Facial musculature symmetric. Psychiatric: Patient is appropriate throughout our interaction. Lymphatic: No cervical lymphadenopathy Musculoskeletal: Gait intact.   LABS: Results for orders placed during the hospital encounter of 02/22/10  ETHANOL      Result Value Range   Alcohol, Ethyl (B)    0 - 10 mg/dL   Value: <5            LOWEST DETECTABLE LIMIT FOR     SERUM ALCOHOL IS 5 mg/dL     FOR MEDICAL PURPOSES ONLY  BASIC METABOLIC PANEL      Result Value Range   Sodium 139  135 - 145  mEq/L   Potassium 4.6  3.5 - 5.1 mEq/L   Chloride 103  96 - 112 mEq/L   CO2 30  19 - 32 mEq/L   Glucose, Bld 84  70 - 99 mg/dL   BUN 14  6 - 23 mg/dL   Creatinine, Ser 1.61  0.4 - 1.5 mg/dL   Calcium 9.4  8.4 - 09.6 mg/dL   GFR calc non Af Amer >60  >60 mL/min   GFR calc Af Amer    >60 mL/min   Value: >60            The eGFR has been calculated     using the MDRD equation.     This calculation has not been     validated in all clinical     situations.     eGFR's persistently     <60 mL/min signify     possible Chronic Kidney Disease.  CBC      Result Value Range   WBC 6.5  4.0 - 10.5 K/uL   RBC 4.18 (*) 4.22 - 5.81 MIL/uL   Hemoglobin 13.9  13.0 - 17.0 g/dL   HCT 04.5  40.9 - 81.1 %   MCV 97.0  78.0 - 100.0 fL   MCH 33.2  26.0 - 34.0 pg   MCHC 34.2  30.0 - 36.0 g/dL   RDW 91.4  78.2 - 95.6 %   Platelets 263  150 - 400 K/uL  DRUG SCREEN PANEL, EMERGENCY      Result Value Range   Opiates NONE DETECTED  NONE DETECTED   Cocaine POSITIVE (*) NONE DETECTED   Benzodiazepines POSITIVE (*) NONE DETECTED   Amphetamines NONE DETECTED  NONE DETECTED   Tetrahydrocannabinol NONE DETECTED  NONE DETECTED   Barbiturates    NONE DETECTED   Value: NONE DETECTED            DRUG SCREEN FOR MEDICAL PURPOSES     ONLY.  IF CONFIRMATION IS NEEDED     FOR ANY PURPOSE, NOTIFY LAB     WITHIN 5 DAYS.                LOWEST DETECTABLE LIMITS     FOR URINE DRUG SCREEN     Drug Class       Cutoff (ng/mL)     Amphetamine      1000     Barbiturate      200     Benzodiazepine   200  Tricyclics       300     Opiates          300     Cocaine          300     THC              50     EKG/XRAY:   Primary read interpreted by Dr. Conley Rolls at [facility].   ASSESSMENT/PLAN: Encounter Diagnoses  Name Primary?  . Generalized anxiety disorder Yes  . History of seizures   . History of drug abuse     He consented to a drug screen and was found to be  positive for opiates, benzos and also THC  He said he  may have taken a pain pill for his knee after I told him the results, he consented to me sending the test out to confirm our in house testing I advise him that we will refer him to substance abuse physician and also neurology for his seizures, he was willing for [place] to do this I have told him that we will no longer be prescribing any medicines for him and have discharged him from our practice.  He was not forthcoming with his history and also he tested + for drugs that he stated he was not using. Patient understood but was irate and left without checking out If the send out test if different then I will call him otherwise he has been discharge from this office Go to Er prn    Marvia Troost PHUONG, DO 12/31/2012 4:50 PM

## 2013-01-04 ENCOUNTER — Telehealth: Payer: Self-pay | Admitting: Family Medicine

## 2013-01-04 NOTE — Telephone Encounter (Signed)
Spoke to patient's mom to get another number, he can be reached at [phone]. I left message to call me back, want to discuss his confirmation drug screen.

## 2013-01-05 ENCOUNTER — Telehealth: Payer: Self-pay | Admitting: Family Medicine

## 2013-01-05 NOTE — Telephone Encounter (Addendum)
I called the cell number that his mom gave me [phone]  and left a message for him to call us back . I would like to talk to him because he came in for a medication refill for anxiety pills and also knee pain complaints. He had been off benzos for over 1 week, he also had some remote h/o seizures off benzos but had not had any seizures for awhile and this had never been worked up.  He told me he had not taken any rx meds or illicit meds for pain/anxiety. He was agreeabel to an inhouse drug screen which was + for THC, opiates and also benzos.  I told him that the screen was + so I planned to dismiss him from our practice and try to arrange a referral to Dr. Carver Fila for psychiatric and also substance abuse evaluation.  He also admitted that he may have taken  some pain pills that he did not tell me about when I confronted him with the + drug screen.  Additionally I would refer him to Neurology for his history of seizures. He was agreeable but irate to this.  Furthermore we needed to confirm the drug screen so we sent it out for confirmation which he agreed to as well, the confirmation was negative.  I would like to talk to him about the send out  confirmation drug test and what the next step is .

## 2013-02-02 ENCOUNTER — Emergency Department (HOSPITAL_COMMUNITY)
Admission: EM | Admit: 2013-02-02 | Discharge: 2013-02-03 | Disposition: A | Discharge status: Home / Self Care | Payer: Self-pay | Attending: Emergency Medicine | Admitting: Emergency Medicine

## 2013-02-02 ENCOUNTER — Emergency Department (HOSPITAL_COMMUNITY): Payer: Self-pay

## 2013-02-02 ENCOUNTER — Encounter (HOSPITAL_COMMUNITY): Payer: Self-pay | Admitting: *Deleted

## 2013-02-02 DIAGNOSIS — Z8669 Personal history of other diseases of the nervous system and sense organs: Secondary | ICD-10-CM | POA: Insufficient documentation

## 2013-02-02 DIAGNOSIS — F17200 Nicotine dependence, unspecified, uncomplicated: Secondary | ICD-10-CM | POA: Insufficient documentation

## 2013-02-02 DIAGNOSIS — F411 Generalized anxiety disorder: Secondary | ICD-10-CM | POA: Insufficient documentation

## 2013-02-02 DIAGNOSIS — F3289 Other specified depressive episodes: Secondary | ICD-10-CM | POA: Insufficient documentation

## 2013-02-02 DIAGNOSIS — R3915 Urgency of urination: Secondary | ICD-10-CM | POA: Insufficient documentation

## 2013-02-02 DIAGNOSIS — F329 Major depressive disorder, single episode, unspecified: Secondary | ICD-10-CM | POA: Insufficient documentation

## 2013-02-02 DIAGNOSIS — Z9889 Other specified postprocedural states: Secondary | ICD-10-CM | POA: Insufficient documentation

## 2013-02-02 DIAGNOSIS — R3911 Hesitancy of micturition: Secondary | ICD-10-CM | POA: Insufficient documentation

## 2013-02-02 DIAGNOSIS — R109 Unspecified abdominal pain: Secondary | ICD-10-CM | POA: Insufficient documentation

## 2013-02-02 DIAGNOSIS — N200 Calculus of kidney: Secondary | ICD-10-CM | POA: Insufficient documentation

## 2013-02-02 DIAGNOSIS — Z8739 Personal history of other diseases of the musculoskeletal system and connective tissue: Secondary | ICD-10-CM | POA: Insufficient documentation

## 2013-02-02 LAB — BASIC METABOLIC PANEL
BUN: 15 mg/dL (ref 6–23)
CO2: 29 mEq/L (ref 19–32)
Chloride: 102 mEq/L (ref 96–112)
Creatinine, Ser: 0.95 mg/dL (ref 0.50–1.35)

## 2013-02-02 LAB — URINALYSIS, ROUTINE W REFLEX MICROSCOPIC
Glucose, UA: NEGATIVE mg/dL
Hgb urine dipstick: NEGATIVE
Ketones, ur: NEGATIVE mg/dL
Leukocytes, UA: NEGATIVE
pH: 6 (ref 5.0–8.0)

## 2013-02-02 LAB — CBC WITH DIFFERENTIAL/PLATELET
Basophils Relative: 1 % (ref 0–1)
HCT: 41.7 % (ref 39.0–52.0)
Hemoglobin: 14.5 g/dL (ref 13.0–17.0)
MCHC: 34.8 g/dL (ref 30.0–36.0)
MCV: 95.2 fL (ref 78.0–100.0)
Monocytes Absolute: 0.4 10*3/uL (ref 0.1–1.0)
Monocytes Relative: 7 % (ref 3–12)
Neutro Abs: 2.7 10*3/uL (ref 1.7–7.7)

## 2013-02-02 MED ORDER — MORPHINE SULFATE 4 MG/ML IJ SOLN
4.0000 mg | Freq: Once | INTRAMUSCULAR | Status: AC
  Administered 2013-02-02: 4 mg via INTRAVENOUS
  Filled 2013-02-02: qty 1

## 2013-02-02 MED ORDER — KETOROLAC TROMETHAMINE 30 MG/ML IJ SOLN
30.0000 mg | Freq: Once | INTRAMUSCULAR | Status: AC
  Administered 2013-02-02: 30 mg via INTRAVENOUS
  Filled 2013-02-02: qty 1

## 2013-02-02 MED ORDER — SODIUM CHLORIDE 0.9 % IV BOLUS (SEPSIS)
1000.0000 mL | Freq: Once | INTRAVENOUS | Status: AC
  Administered 2013-02-02: 1000 mL via INTRAVENOUS

## 2013-02-02 MED ORDER — ONDANSETRON HCL 4 MG/2ML IJ SOLN
4.0000 mg | Freq: Once | INTRAMUSCULAR | Status: AC
  Administered 2013-02-02: 4 mg via INTRAVENOUS
  Filled 2013-02-02: qty 2

## 2013-02-02 NOTE — ED Provider Notes (Signed)
CSN: [id number]     Arrival date & time 02/02/13  2114 History  This chart was scribed for Glynn Octave, MD by Carl Best, ED Scribe. This patient was seen in room APA07/APA07 and the patient's care was started at 10:36 PM.     Chief Complaint  Patient presents with  . Flank Pain    The history is provided by the patient. No language interpreter was used.   HPI Comments: Andre Lynch is a 32 y.o. male who presents to the Emergency Department complaining of left flank pain described as stabbing that has persisted for the last hour and a half.  Patient states that the pain has been intermittent for the past two weeks and typically lasts for 2-3 hours.  Patient is taking Ibuprofen for the pain with no improvement. He also complains of associated hesitancy and urgency.  He denies prior episodes or prior kidney stone diagnoses.  Patient has a h/o of hernia repair that occurred 10 years ago and states that the pain is located in the same spot as the procedure but denies that the pain is the same.  He also denies associated testicular pain, back pain, and diarrhea.  He states that he has had normal bowel movements.  Patient denies a history of appendectomy and cholecystectomy.    Patient has a family h/o kidney stones.     Past Medical History  Diagnosis Date  . History of narcotic addiction     methadone tx  . Back pain   . RLS (restless legs syndrome)   . Seizures   . Substance abuse   . Anxiety   . Depression    Past Surgical History  Procedure Laterality Date  . Hernia repair    . Wisdom tooth extraction     Family History  Problem Relation Age of Onset  . Cancer Mother     breast cancer survivor   History  Substance Use Topics  . Smoking status: Current Every Day Smoker    Types: Cigarettes  . Smokeless tobacco: Not on file  . Alcohol Use: No     Comment: rarely    Review of Systems A complete 10 system review of systems was obtained and all systems are negative  except as noted in the HPI and PMH.   Allergies  Ibuprofen and Tylenol  Home Medications   Current Outpatient Rx  Name  Route  Sig  Dispense  Refill  . ALPRAZolam (XANAX) 1 MG tablet   Oral   Take 1 mg by mouth as needed for sleep or anxiety.         Marland Kitchen oxyCODONE-acetaminophen (PERCOCET/ROXICET) 5-325 MG per tablet   Oral   Take 1 tablet by mouth once.         . ondansetron (ZOFRAN) 4 MG tablet   Oral   Take 1 tablet (4 mg total) by mouth every 6 (six) hours.   12 tablet   0   . oxyCODONE (ROXICODONE) 5 MG immediate release tablet   Oral   Take 1 tablet (5 mg total) by mouth every 4 (four) hours as needed for pain.   15 tablet   0    Triage Vitals: BP 131/80  Pulse 106  Temp(Src) 98.2 F (36.8 C) (Oral)  Resp 18  Ht 5\' 8"  (1.727 m)  Wt 140 lb (63.504 kg)  BMI 21.29 kg/m2  SpO2 98% Physical Exam  Nursing note and vitals reviewed. Constitutional: He is oriented to person, place, and time.  He appears well-developed and well-nourished.  uncomfortable  HENT:  Head: Normocephalic and atraumatic.  Right Ear: External ear normal.  Left Ear: External ear normal.  Eyes: Conjunctivae and EOM are normal. Pupils are equal, round, and reactive to light.  Neck: Normal range of motion and phonation normal. Neck supple.  Cardiovascular: Normal rate, regular rhythm, normal heart sounds and intact distal pulses.   Pulmonary/Chest: Effort normal and breath sounds normal. He exhibits no bony tenderness.  Abdominal: Soft. Normal appearance. There is tenderness (Left CVA, LLQ).  Genitourinary:  No testicular tenderness  Musculoskeletal: Normal range of motion.  Neurological: He is alert and oriented to person, place, and time. He has normal strength. No cranial nerve deficit or sensory deficit. He exhibits normal muscle tone. Coordination normal.  Skin: Skin is warm, dry and intact.  Psychiatric: He has a normal mood and affect. His behavior is normal. Judgment and thought  content normal.    ED Course  Procedures (including critical care time)  DIAGNOSTIC STUDIES: Oxygen Saturation is 98% on room air, normal by my interpretation.    COORDINATION OF CARE: 10:41 PM- Discussed clinical suspicion of a kidney stone with patient at bedside.  Discussed starting patient on medication to treat pain and nausea and patient agreed.      Labs Review Labs Reviewed  URINALYSIS, ROUTINE W REFLEX MICROSCOPIC - Abnormal; Notable for the following:    Specific Gravity, Urine >1.030 (*)    Bilirubin Urine SMALL (*)    All other components within normal limits  CBC WITH DIFFERENTIAL - Abnormal; Notable for the following:    Lymphocytes Relative 48 (*)    All other components within normal limits  BASIC METABOLIC PANEL   Imaging Review Ct Abdomen Pelvis Wo Contrast  02/03/2013   *RADIOLOGY REPORT*  Clinical Data: Left flank pain for 2 weeks  CT ABDOMEN AND PELVIS WITHOUT CONTRAST  Technique:  Multidetector CT imaging of the abdomen and pelvis was performed following the standard protocol without intravenous contrast. Oral contrast not administered.  Sagittal and coronal MPR images reconstructed from axial data set.  Comparison: None  Findings: Lung bases clear. No hydronephrosis or ureteral dilatation. Tiny 1-2 mm diameter calculus identified at distal left ureter. No additional urinary tract calculi. Within limits of a nonenhanced exam no focal abnormalities of the liver, spleen, pancreas, kidneys, or adrenal glands.  Stomach and bowel loops normal appearance for technique. Bladder decompressed. Appendix not visualized. No mass, adenopathy, free fluid, or inflammatory process. Minimal prostatic enlargement. No hernia or acute bony lesions.  IMPRESSION: Tiny 1-2 mm diameter nonobstructing distal left ureteral calculus without hydronephrosis or ureteral dilatation.   Original Report Authenticated By: Ulyses Southward, M.D.    MDM   1. Flank pain   2. Nephrolithiasis    2 hour  history of left lower quadrant abdominal pain and flank pain. Intermittent episodes over the past 2 weeks. Associated with nausea and urgency. No vomiting.  Vital stable, no distress. Mild left lower quadrant tenderness and CVA tenderness. No testicular tenderness  Urinalysis is negative. CT scan shows punctate stone in the distal left ureter. No hydronephrosis  Pain controlled in ED. Tolerating by mouth. Patient will be discharged on analgesics and antiemetics. Followup with urology as needed. Return precautions discussed  I personally performed the services described in this documentation, which was scribed in my presence. The recorded information has been reviewed and is accurate.    Glynn Octave, MD 02/03/13 0030

## 2013-02-02 NOTE — ED Notes (Signed)
Lt flank pain , nausea, no vomiting.   3 episodes of pain for 2 weeks,  Urgency

## 2013-02-03 MED ORDER — OXYCODONE HCL 5 MG PO TABS: 5.0000 mg | ORAL_TABLET | ORAL | Status: DC | PRN

## 2013-02-03 MED ORDER — ONDANSETRON HCL 4 MG PO TABS: 4.0000 mg | ORAL_TABLET | Freq: Four times a day (QID) | ORAL | Status: DC

## 2013-02-03 NOTE — ED Notes (Signed)
Patient awaiting the arrival of his mother to transport him home.  Security aware.

## 2013-02-04 ENCOUNTER — Emergency Department (HOSPITAL_COMMUNITY)
Admission: EM | Admit: 2013-02-04 | Discharge: 2013-02-04 | Disposition: A | Discharge status: Home / Self Care | Payer: Self-pay | Attending: Emergency Medicine | Admitting: Emergency Medicine

## 2013-02-04 ENCOUNTER — Encounter (HOSPITAL_COMMUNITY): Payer: Self-pay

## 2013-02-04 DIAGNOSIS — Z8669 Personal history of other diseases of the nervous system and sense organs: Secondary | ICD-10-CM | POA: Insufficient documentation

## 2013-02-04 DIAGNOSIS — Z8679 Personal history of other diseases of the circulatory system: Secondary | ICD-10-CM | POA: Insufficient documentation

## 2013-02-04 DIAGNOSIS — F17200 Nicotine dependence, unspecified, uncomplicated: Secondary | ICD-10-CM | POA: Insufficient documentation

## 2013-02-04 DIAGNOSIS — F329 Major depressive disorder, single episode, unspecified: Secondary | ICD-10-CM | POA: Insufficient documentation

## 2013-02-04 DIAGNOSIS — F411 Generalized anxiety disorder: Secondary | ICD-10-CM | POA: Insufficient documentation

## 2013-02-04 DIAGNOSIS — F3289 Other specified depressive episodes: Secondary | ICD-10-CM | POA: Insufficient documentation

## 2013-02-04 DIAGNOSIS — R3915 Urgency of urination: Secondary | ICD-10-CM | POA: Insufficient documentation

## 2013-02-04 DIAGNOSIS — N200 Calculus of kidney: Secondary | ICD-10-CM | POA: Insufficient documentation

## 2013-02-04 DIAGNOSIS — R52 Pain, unspecified: Secondary | ICD-10-CM | POA: Insufficient documentation

## 2013-02-04 LAB — URINALYSIS, ROUTINE W REFLEX MICROSCOPIC
Bilirubin Urine: NEGATIVE
Glucose, UA: NEGATIVE mg/dL
Ketones, ur: NEGATIVE mg/dL
pH: 6 (ref 5.0–8.0)

## 2013-02-04 LAB — URINE MICROSCOPIC-ADD ON

## 2013-02-04 MED ORDER — OXYCODONE HCL 5 MG PO TABS: 5.0000 mg | ORAL_TABLET | ORAL | Status: DC | PRN

## 2013-02-04 MED ORDER — ONDANSETRON HCL 4 MG/2ML IJ SOLN
4.0000 mg | Freq: Once | INTRAMUSCULAR | Status: AC
  Administered 2013-02-04: 4 mg via INTRAVENOUS
  Filled 2013-02-04: qty 2

## 2013-02-04 MED ORDER — HYDROMORPHONE HCL PF 1 MG/ML IJ SOLN
1.0000 mg | Freq: Once | INTRAMUSCULAR | Status: AC
  Administered 2013-02-04: 1 mg via INTRAVENOUS
  Filled 2013-02-04: qty 1

## 2013-02-04 MED ORDER — SODIUM CHLORIDE 0.9 % IV BOLUS (SEPSIS): 500.0000 mL | Freq: Once | INTRAVENOUS | Status: DC

## 2013-02-04 MED ORDER — PROMETHAZINE HCL 25 MG PO TABS: 25.0000 mg | ORAL_TABLET | Freq: Four times a day (QID) | ORAL | Status: DC | PRN

## 2013-02-04 NOTE — ED Provider Notes (Signed)
CSN: [id number]     Arrival date & time 02/04/13  0714 History   This chart was scribed for Donnetta Hutching, MD, by Yevette Edwards, ED Scribe. This patient was seen in room APA04/APA04 and the patient's care was started at 7:33 AM. First MD Initiated Contact with Patient 02/04/13 [phone]     Chief Complaint  Patient presents with  . Nephrolithiasis   (Consider location/radiation/quality/duration/timing/severity/associated sxs/prior Treatment) The history is provided by the patient. No language interpreter was used.   HPI Comments: Andre Lynch is a 32 y.o. male who presents to the Emergency Department complaining of acute, left lower quadrant pain which suddenly increased this morning; he rates the pain as 8/10. The pt reports the pain was initially to his left flank, and that he had experienced intermittent, left flank pain for the previous two weeks. He was treated at the ED two nights ago for similar symptoms, and he was diagnosed with a kidney calculus. He was prescribed an analgesic for the kidney calculus, but he reports no relief to his current pain with the pain medication. The pt has experienced urgency and hesitancy as associated symptoms. He denies any hematuria. The pt has not had a h/o kidney calculi prior to this episode.   Past Medical History  Diagnosis Date  . History of narcotic addiction     methadone tx  . Back pain   . RLS (restless legs syndrome)   . Seizures   . Substance abuse   . Anxiety   . Depression    Past Surgical History  Procedure Laterality Date  . Hernia repair    . Wisdom tooth extraction     Family History  Problem Relation Age of Onset  . Cancer Mother     breast cancer survivor   History  Substance Use Topics  . Smoking status: Current Every Day Smoker    Types: Cigarettes  . Smokeless tobacco: Not on file  . Alcohol Use: No     Comment: rarely    Review of Systems  Constitutional: Negative for fever and chills.  Gastrointestinal: Positive  for abdominal pain.  Genitourinary: Positive for urgency and difficulty urinating. Negative for hematuria and flank pain.  All other systems reviewed and are negative.    Allergies  Ibuprofen and Tylenol  Home Medications   Current Outpatient Rx  Name  Route  Sig  Dispense  Refill  . ALPRAZolam (XANAX) 1 MG tablet   Oral   Take 1 mg by mouth as needed for sleep or anxiety.         . ondansetron (ZOFRAN) 4 MG tablet   Oral   Take 1 tablet (4 mg total) by mouth every 6 (six) hours.   12 tablet   0   . oxyCODONE (ROXICODONE) 5 MG immediate release tablet   Oral   Take 1 tablet (5 mg total) by mouth every 4 (four) hours as needed for pain.   15 tablet   0   . oxyCODONE-acetaminophen (PERCOCET/ROXICET) 5-325 MG per tablet   Oral   Take 1 tablet by mouth once.          Triage Vitals: BP 132/86  Pulse 84  Temp(Src) 97.7 F (36.5 C) (Oral)  Resp 18  SpO2 100%  Physical Exam  Nursing note and vitals reviewed. Constitutional: He is oriented to person, place, and time. He appears well-developed and well-nourished.  HENT:  Head: Normocephalic and atraumatic.  Eyes: Conjunctivae and EOM are normal. Pupils are equal,  round, and reactive to light.  Neck: Normal range of motion. Neck supple.  Cardiovascular: Normal rate, regular rhythm and normal heart sounds.   Pulmonary/Chest: Effort normal and breath sounds normal.  Abdominal: Soft. Bowel sounds are normal. There is tenderness.  Tenderness in LLQ.   Musculoskeletal: Normal range of motion.  Neurological: He is alert and oriented to person, place, and time.  Skin: Skin is warm and dry.  Psychiatric: He has a normal mood and affect.    ED Course  Procedures (including critical care time)  DIAGNOSTIC STUDIES: Oxygen Saturation is 100% on room air, normal by my interpretation.    COORDINATION OF CARE:  7:37 AM- Discussed treatment plan with patient which includes pain medication, and the patient agreed to the  plan.   Labs Review Labs Reviewed  URINALYSIS, ROUTINE W REFLEX MICROSCOPIC - Abnormal; Notable for the following:    Specific Gravity, Urine >1.030 (*)    Hgb urine dipstick TRACE (*)    All other components within normal limits  URINE MICROSCOPIC-ADD ON - Abnormal; Notable for the following:    Bacteria, UA FEW (*)    Casts HYALINE CASTS (*)    All other components within normal limits  URINE CULTURE   Imaging Review Ct Abdomen Pelvis Wo Contrast  02/03/2013   *RADIOLOGY REPORT*  Clinical Data: Left flank pain for 2 weeks  CT ABDOMEN AND PELVIS WITHOUT CONTRAST  Technique:  Multidetector CT imaging of the abdomen and pelvis was performed following the standard protocol without intravenous contrast. Oral contrast not administered.  Sagittal and coronal MPR images reconstructed from axial data set.  Comparison: None  Findings: Lung bases clear. No hydronephrosis or ureteral dilatation. Tiny 1-2 mm diameter calculus identified at distal left ureter. No additional urinary tract calculi. Within limits of a nonenhanced exam no focal abnormalities of the liver, spleen, pancreas, kidneys, or adrenal glands.  Stomach and bowel loops normal appearance for technique. Bladder decompressed. Appendix not visualized. No mass, adenopathy, free fluid, or inflammatory process. Minimal prostatic enlargement. No hernia or acute bony lesions.  IMPRESSION: Tiny 1-2 mm diameter nonobstructing distal left ureteral calculus without hydronephrosis or ureteral dilatation.   Original Report Authenticated By: Ulyses Southward, M.D.    MDM  No diagnosis found. CT scan of abdomen and pelvis from 02/03/2013 reveals a 1-2  distal left ureteral calculus.  Patient feels better after pain management. Discharge meds Roxicodone and Phenergan 25 mg I personally performed the services described in this documentation, which was scribed in my presence. The recorded information has been reviewed and is accurate.    Donnetta Hutching,  MD 02/04/13 1225

## 2013-02-04 NOTE — ED Notes (Signed)
Pt reports left flank pain that started 2 weeks ago, was seen on Friday night and told he had kidney stone. Returns today w/ sever pain not controlled by pain meds.

## 2013-02-05 LAB — URINE CULTURE

## 2013-06-10 ENCOUNTER — Emergency Department (HOSPITAL_COMMUNITY)
Admission: EM | Admit: 2013-06-10 | Discharge: 2013-06-10 | Disposition: A | Discharge status: Home / Self Care | Payer: Self-pay | Attending: Emergency Medicine | Admitting: Emergency Medicine

## 2013-06-10 ENCOUNTER — Encounter (HOSPITAL_COMMUNITY): Payer: Self-pay | Admitting: Emergency Medicine

## 2013-06-10 ENCOUNTER — Emergency Department (HOSPITAL_COMMUNITY): Payer: Self-pay

## 2013-06-10 DIAGNOSIS — F3289 Other specified depressive episodes: Secondary | ICD-10-CM | POA: Insufficient documentation

## 2013-06-10 DIAGNOSIS — F329 Major depressive disorder, single episode, unspecified: Secondary | ICD-10-CM | POA: Insufficient documentation

## 2013-06-10 DIAGNOSIS — F17200 Nicotine dependence, unspecified, uncomplicated: Secondary | ICD-10-CM | POA: Insufficient documentation

## 2013-06-10 DIAGNOSIS — S46909A Unspecified injury of unspecified muscle, fascia and tendon at shoulder and upper arm level, unspecified arm, initial encounter: Secondary | ICD-10-CM | POA: Insufficient documentation

## 2013-06-10 DIAGNOSIS — W19XXXA Unspecified fall, initial encounter: Secondary | ICD-10-CM

## 2013-06-10 DIAGNOSIS — S0993XA Unspecified injury of face, initial encounter: Secondary | ICD-10-CM | POA: Insufficient documentation

## 2013-06-10 DIAGNOSIS — F411 Generalized anxiety disorder: Secondary | ICD-10-CM | POA: Insufficient documentation

## 2013-06-10 DIAGNOSIS — W1789XA Other fall from one level to another, initial encounter: Secondary | ICD-10-CM | POA: Insufficient documentation

## 2013-06-10 DIAGNOSIS — S4980XA Other specified injuries of shoulder and upper arm, unspecified arm, initial encounter: Secondary | ICD-10-CM | POA: Insufficient documentation

## 2013-06-10 DIAGNOSIS — Z8669 Personal history of other diseases of the nervous system and sense organs: Secondary | ICD-10-CM | POA: Insufficient documentation

## 2013-06-10 DIAGNOSIS — S20229A Contusion of unspecified back wall of thorax, initial encounter: Secondary | ICD-10-CM | POA: Insufficient documentation

## 2013-06-10 DIAGNOSIS — Y9389 Activity, other specified: Secondary | ICD-10-CM | POA: Insufficient documentation

## 2013-06-10 DIAGNOSIS — S199XXA Unspecified injury of neck, initial encounter: Secondary | ICD-10-CM

## 2013-06-10 DIAGNOSIS — Y92009 Unspecified place in unspecified non-institutional (private) residence as the place of occurrence of the external cause: Secondary | ICD-10-CM | POA: Insufficient documentation

## 2013-06-10 MED ORDER — OXYCODONE HCL 5 MG PO TABS
5.0000 mg | ORAL_TABLET | Freq: Once | ORAL | Status: AC
  Administered 2013-06-10: 5 mg via ORAL
  Filled 2013-06-10: qty 1

## 2013-06-10 MED ORDER — OXYCODONE HCL 7.5 MG PO TABS: 1.0000 | ORAL_TABLET | ORAL | Status: DC | PRN

## 2013-06-10 NOTE — ED Notes (Signed)
I ws in the attic checking on some pipes, and I stepped through the ceiling and fell to the hardwood floor below. Hurting between my shoulder blades, in the back of my neck, and in the lower part of my back per pt.

## 2013-06-10 NOTE — Discharge Instructions (Signed)
Contusion A contusion is a deep bruise. Contusions are the result of an injury that caused bleeding under the skin. The contusion may turn blue, purple, or yellow. Minor injuries will give you a painless contusion, but more severe contusions may stay painful and swollen for a few weeks.  CAUSES  A contusion is usually caused by a blow, trauma, or direct force to an area of the body. SYMPTOMS   Swelling and redness of the injured area.  Bruising of the injured area.  Tenderness and soreness of the injured area.  Pain. DIAGNOSIS  The diagnosis can be made by taking a history and physical exam. An X-ray, CT scan, or MRI may be needed to determine if there were any associated injuries, such as fractures. TREATMENT  Specific treatment will depend on what area of the body was injured. In general, the best treatment for a contusion is resting, icing, elevating, and applying cold compresses to the injured area. Over-the-counter medicines may also be recommended for pain control. Ask your caregiver what the best treatment is for your contusion. HOME CARE INSTRUCTIONS   Put ice on the injured area.  Put ice in a plastic bag.  Place a towel between your skin and the bag.  Leave the ice on for 15-20 minutes, 03-04 times a day.  Only take over-the-counter or prescription medicines for pain, discomfort, or fever as directed by your caregiver. Your caregiver may recommend avoiding anti-inflammatory medicines (aspirin, ibuprofen, and naproxen) for 48 hours because these medicines may increase bruising.  Rest the injured area.  If possible, elevate the injured area to reduce swelling. SEEK IMMEDIATE MEDICAL CARE IF:   You have increased bruising or swelling.  You have pain that is getting worse.  Your swelling or pain is not relieved with medicines. MAKE SURE YOU:   Understand these instructions.  Will watch your condition.  Will get help right away if you are not doing well or get  worse. Document Released: 02/24/2005 Document Revised: 08/09/2011 Document Reviewed: 03/22/2011 [facility] Patient Information 2014 [place], [place].  Oxycodone tablets or capsules What is this medicine? OXYCODONE (ox i KOE done) is a pain reliever. It is used to treat moderate to severe pain. This medicine may be used for other purposes; ask your health care provider or pharmacist if you have questions. COMMON BRAND NAME(S): Dazidox , Endocodone , OXECTA, OxyIR, Percolone, Roxicodone What should I tell my health care provider before I take this medicine? They need to know if you have any of these conditions: -Addison's disease -brain tumor -drug abuse or addiction -head injury -heart disease -if you frequently drink alcohol containing drinks -kidney disease or problems going to the bathroom -liver disease -lung disease, asthma, or breathing problems -mental problems -an unusual or allergic reaction to oxycodone, codeine, hydrocodone, morphine, other medicines, foods, dyes, or preservatives -pregnant or trying to get pregnant -breast-feeding How should I use this medicine? Take this medicine by mouth with a glass of water. Follow the directions on the prescription label. You can take it with or without food. If it upsets your stomach, take it with food. Take your medicine at regular intervals. Do not take it more often than directed. Do not stop taking except on your doctor's advice. Some brands of this medicine, like Oxecta, have special instructions. Ask your doctor or pharmacist if these directions are for you: Do not cut, crush or chew this medicine. Swallow only one tablet at a time. Do not wet, soak, or lick the tablet before  you take it. Talk to your pediatrician regarding the use of this medicine in children. Special care may be needed. Overdosage: If you think you have taken too much of this medicine contact a poison control center or emergency room at once. NOTE: This medicine is  only for you. Do not share this medicine with others. What if I miss a dose? If you miss a dose, take it as soon as you can. If it is almost time for your next dose, take only that dose. Do not take double or extra doses. What may interact with this medicine? -alcohol -antihistamines -certain medicines used for nausea like chlorpromazine, droperidol -erythromycin -ketoconazole -medicines for depression, anxiety, or psychotic disturbances -medicines for sleep -muscle relaxants -naloxone -naltrexone -narcotic medicines (opiates) for pain -nilotinib -phenobarbital -phenytoin -rifampin -ritonavir -voriconazole This list may not describe all possible interactions. Give your health care provider a list of all the medicines, herbs, non-prescription drugs, or dietary supplements you use. Also tell them if you smoke, drink alcohol, or use illegal drugs. Some items may interact with your medicine. What should I watch for while using this medicine? Tell your doctor or health care professional if your pain does not go away, if it gets worse, or if you have new or a different type of pain. You may develop tolerance to the medicine. Tolerance means that you will need a higher dose of the medicine for pain relief. Tolerance is normal and is expected if you take this medicine for a long time. Do not suddenly stop taking your medicine because you may develop a severe reaction. Your body becomes used to the medicine. This does NOT mean you are addicted. Addiction is a behavior related to getting and using a drug for a non-medical reason. If you have pain, you have a medical reason to take pain medicine. Your doctor will tell you how much medicine to take. If your doctor wants you to stop the medicine, the dose will be slowly lowered over time to avoid any side effects. You may get drowsy or dizzy when you first start taking this medicine or change doses. Do not drive, use machinery, or do anything that may be  dangerous until you know how the medicine affects you. Stand or sit up slowly. There are different types of narcotic medicines (opiates) for pain. If you take more than one type at the same time, you may have more side effects. Give your health care provider a list of all medicines you use. Your doctor will tell you how much medicine to take. Do not take more medicine than directed. Call emergency for help if you have problems breathing. This medicine will cause constipation. Try to have a bowel movement at least every 2 to 3 days. If you do not have a bowel movement for 3 days, call your doctor or health care professional. Your mouth may get dry. Drinking water, chewing sugarless gum, or sucking on hard candy may help. See your dentist every 6 months. What side effects may I notice from receiving this medicine? Side effects that you should report to your doctor or health care professional as soon as possible: -allergic reactions like skin rash, itching or hives, swelling of the face, lips, or tongue -breathing problems -confusion -feeling faint or lightheaded, falls -trouble passing urine or change in the amount of urine -unusually weak or tired Side effects that usually do not require medical attention (report to your doctor or health care professional if they continue or are bothersome): -  constipation -dry mouth -itching -nausea, vomiting -upset stomach This list may not describe all possible side effects. Call your doctor for medical advice about side effects. You may report side effects to FDA at 1-800-FDA-1088. Where should I keep my medicine? Keep out of the reach of children. This medicine can be abused. Keep your medicine in a safe place to protect it from theft. Do not share this medicine with anyone. Selling or giving away this medicine is dangerous and against the law. Store at room temperature between 15 and 30 degrees C (59 and 86 degrees F). Protect from light. Keep container  tightly closed. This medicine may cause accidental overdose and death if it is taken by other adults, children, or pets. Flush any unused medicine down the toilet to reduce the chance of harm. Do not use the medicine after the expiration date. NOTE: This sheet is a summary. It may not cover all possible information. If you have questions about this medicine, talk to your doctor, pharmacist, or health care provider.  2014, [employer] Standard. (2013-01-25 13:43:33)

## 2013-06-10 NOTE — ED Provider Notes (Signed)
CSN: [id number]     Arrival date & time 06/10/13  0423 History   First MD Initiated Contact with Patient 06/10/13 0434     Chief Complaint  Patient presents with  . Fall   (Consider location/radiation/quality/duration/timing/severity/associated sxs/prior Treatment) Patient is a 33 y.o. male presenting with fall. The history is provided by the patient.  Fall  He was in his attic and he fell through the ceiling landing on hard wood floor. He landed on his back. Is complaining of pain in the low lumbar area, intrascapular area, and in his neck. He denies head injury or loss of consciousness. He rates pain at 7/10. He drove himself to the ED.  Past Medical History  Diagnosis Date  . History of narcotic addiction     methadone tx  . Back pain   . RLS (restless legs syndrome)   . Seizures   . Substance abuse   . Anxiety   . Depression    Past Surgical History  Procedure Laterality Date  . Hernia repair    . Wisdom tooth extraction     Family History  Problem Relation Age of Onset  . Cancer Mother     breast cancer survivor   History  Substance Use Topics  . Smoking status: Current Every Day Smoker    Types: Cigarettes  . Smokeless tobacco: Not on file  . Alcohol Use: No     Comment: rarely    Review of Systems  All other systems reviewed and are negative.    Allergies  Ibuprofen and Tylenol  Home Medications   Current Outpatient Rx  Name  Route  Sig  Dispense  Refill  . ALPRAZolam (XANAX) 1 MG tablet   Oral   Take 1 mg by mouth as needed for sleep or anxiety.         . ondansetron (ZOFRAN) 4 MG tablet   Oral   Take 1 tablet (4 mg total) by mouth every 6 (six) hours.   12 tablet   0   . oxyCODONE (ROXICODONE) 5 MG immediate release tablet   Oral   Take 1 tablet (5 mg total) by mouth every 4 (four) hours as needed for pain.   15 tablet   0   . oxyCODONE (ROXICODONE) 5 MG immediate release tablet   Oral   Take 1 tablet (5 mg total) by mouth every 4  (four) hours as needed for pain.   15 tablet   0   . promethazine (PHENERGAN) 25 MG tablet   Oral   Take 1 tablet (25 mg total) by mouth every 6 (six) hours as needed for nausea.   15 tablet   0    BP 135/89  Pulse 114  Temp(Src) 98 F (36.7 C) (Oral)  Resp 18  Ht 5\' 8"  (1.727 m)  Wt 135 lb (61.236 kg)  BMI 20.53 kg/m2  SpO2 98% Physical Exam  Nursing note and vitals reviewed.  33 year old male, resting comfortably and in no acute distress. Vital signs are significant for tachycardia with heart rate 114. Oxygen saturation is 98%, which is normal. Head is normocephalic and atraumatic. PERRLA, EOMI. Oropharynx is clear. Neck is moderately tender over the mid and lower cervical spine. There is no adenopathy or JVD. Back is moderately tender in the upper thoracic spine in the mid and lower lumbar spine. There is no paraspinal tenderness. Lungs are clear without rales, wheezes, or rhonchi. Chest is nontender. Heart has regular rate and rhythm without  murmur. Abdomen is soft, flat, nontender without masses or hepatosplenomegaly and peristalsis is normoactive. Extremities have no cyanosis or edema, full range of motion is present. Skin is warm and dry without rash. Neurologic: Mental status is normal, cranial nerves are intact, there are no motor or sensory deficits.  ED Course  Procedures (including critical care time) Imaging Review Dg Thoracic Spine W/swimmers  06/10/2013   CLINICAL DATA:  Pain after fall.  EXAM: THORACIC SPINE - 2 VIEW + SWIMMERS  COMPARISON:  Chest radiograph June 28, 2008.  FINDINGS: There is no evidence of thoracic spine fracture. Mid thoracic moderate dextroscoliosis. Alignment is normal. No other significant bone abnormalities are identified.  IMPRESSION: Mid thoracic dextroscoliosis without acute fracture deformity or malalignment.   Electronically Signed   By: Awilda Metroourtnay  Bloomer   On: 06/10/2013 05:34   Dg Lumbar Spine Complete  06/10/2013   CLINICAL  DATA:  Pain after fall.  EXAM: LUMBAR SPINE - COMPLETE 4+ VIEW  COMPARISON:  CT of the abdomen and pelvis February 02, 2013.  FINDINGS: Mild broad levoscoliosis, unchanged. Lumbar vertebral bodies and posterior elements appear intact and aligned with maintenance of lumbar lordosis. Moderate unchanged L5-S1 disc height loss. No pars interarticularis defects. No destructive bony lesions. Included prevertebral and paraspinal soft tissue planes are nonsuspicious.  IMPRESSION: No acute lumbar spine fracture deformity nor malalignment.  Stable L5-S1 disc height loss.  Mild broad levoscoliosis.   Electronically Signed   By: Awilda Metroourtnay  Bloomer   On: 06/10/2013 05:33   Ct Cervical Spine Wo Contrast  06/10/2013   CLINICAL DATA:  Fall, back pain.  EXAM: CT CERVICAL SPINE WITHOUT CONTRAST  TECHNIQUE: Multidetector CT imaging of the cervical spine was performed without intravenous contrast. Multiplanar CT image reconstructions were also generated.  COMPARISON:  CT of the head June 08, 2011.  FINDINGS: Cervical vertebral bodies and posterior elements are intact and aligned with straightened cervical lordosis. Intervertebral disc height preserved. No destructive bony lesions ; 3 mm sclerotic focus within the left C6 pedicle may reflect a bone island. C1-2 articulation maintained. Included prevertebral and paraspinal soft tissues are nonsuspicious ; right apical blebs.  No osseous canal stenosis or neural foraminal narrowing at any level.  IMPRESSION: Straightened cervical lordosis without acute fracture nor malalignment.   Electronically Signed   By: Awilda Metroourtnay  Bloomer   On: 06/10/2013 05:44    MDM   1. Fall at home, initial encounter   2. Contusion, back, unspecified laterality, initial encounter    Fall with tenderness in cervical, upper thoracic, and lumbar spine. He'll be sent for CT scan of cervical spine, and plain x-rays of thoracic and lumbar spine. He was given oxycodone for pain.    Dione Boozeavid Sophronia Varney,  MD 06/10/13 [phone]

## 2013-08-03 ENCOUNTER — Encounter (HOSPITAL_COMMUNITY): Payer: Self-pay | Admitting: Emergency Medicine

## 2013-08-03 ENCOUNTER — Emergency Department (HOSPITAL_COMMUNITY): Payer: Self-pay

## 2013-08-03 DIAGNOSIS — Z87442 Personal history of urinary calculi: Secondary | ICD-10-CM | POA: Insufficient documentation

## 2013-08-03 DIAGNOSIS — R0789 Other chest pain: Secondary | ICD-10-CM | POA: Insufficient documentation

## 2013-08-03 DIAGNOSIS — Z8669 Personal history of other diseases of the nervous system and sense organs: Secondary | ICD-10-CM | POA: Insufficient documentation

## 2013-08-03 DIAGNOSIS — F17200 Nicotine dependence, unspecified, uncomplicated: Secondary | ICD-10-CM | POA: Insufficient documentation

## 2013-08-03 DIAGNOSIS — F411 Generalized anxiety disorder: Secondary | ICD-10-CM | POA: Insufficient documentation

## 2013-08-03 DIAGNOSIS — Z8659 Personal history of other mental and behavioral disorders: Secondary | ICD-10-CM | POA: Insufficient documentation

## 2013-08-03 DIAGNOSIS — Z79899 Other long term (current) drug therapy: Secondary | ICD-10-CM | POA: Insufficient documentation

## 2013-08-03 LAB — BASIC METABOLIC PANEL
BUN: 14 mg/dL (ref 6–23)
CO2: 26 meq/L (ref 19–32)
Calcium: 9.4 mg/dL (ref 8.4–10.5)
Chloride: 105 mEq/L (ref 96–112)
Creatinine, Ser: 1.01 mg/dL (ref 0.50–1.35)
GFR calc Af Amer: >90 mL/min (ref >90)
GFR calc non Af Amer: >90 mL/min (ref >90)
Glucose, Bld: 114 mg/dL — ABNORMAL HIGH (ref 70–99)
POTASSIUM: 4.4 meq/L (ref 3.7–5.3)
SODIUM: 142 meq/L (ref 137–147)

## 2013-08-03 LAB — CBC WITH DIFFERENTIAL/PLATELET
Basophils Absolute: 0 10*3/uL (ref 0.0–0.1)
Basophils Relative: 0 % (ref 0–1)
Eosinophils Absolute: 0.2 10*3/uL (ref 0.0–0.7)
Eosinophils Relative: 3 % (ref 0–5)
HCT: 40.6 % (ref 39.0–52.0)
HEMOGLOBIN: 13.7 g/dL (ref 13.0–17.0)
LYMPHS ABS: 2.8 10*3/uL (ref 0.7–4.0)
Lymphocytes Relative: 45 % (ref 12–46)
MCH: 32.8 pg (ref 26.0–34.0)
MCHC: 33.7 g/dL (ref 30.0–36.0)
MCV: 97.1 fL (ref 78.0–100.0)
MONOS PCT: 9 % (ref 3–12)
Monocytes Absolute: 0.5 10*3/uL (ref 0.1–1.0)
NEUTROS PCT: 43 % (ref 43–77)
Neutro Abs: 2.7 10*3/uL (ref 1.7–7.7)
Platelets: 267 10*3/uL (ref 150–400)
RBC: 4.18 MIL/uL — AB (ref 4.22–5.81)
RDW: 12.4 % (ref 11.5–15.5)
WBC: 6.2 10*3/uL (ref 4.0–10.5)

## 2013-08-03 LAB — TROPONIN I: Troponin I: <0.3 ng/mL (ref <0.30)

## 2013-08-03 NOTE — ED Notes (Signed)
Pt says he is having panic attacks, chest discomfort,  Hx of same,  Unable to see his MD now  Due to finances  And does not have his meds.

## 2013-08-04 ENCOUNTER — Emergency Department (HOSPITAL_COMMUNITY)
Admission: EM | Admit: 2013-08-04 | Discharge: 2013-08-04 | Disposition: A | Discharge status: Home / Self Care | Payer: Self-pay | Attending: Emergency Medicine | Admitting: Emergency Medicine

## 2013-08-04 DIAGNOSIS — F419 Anxiety disorder, unspecified: Secondary | ICD-10-CM

## 2013-08-04 DIAGNOSIS — R079 Chest pain, unspecified: Secondary | ICD-10-CM

## 2013-08-04 HISTORY — DX: Calculus of kidney: N20.0

## 2013-08-04 MED ORDER — ALPRAZOLAM 0.5 MG PO TABS: 0.5000 mg | ORAL_TABLET | Freq: Two times a day (BID) | ORAL | Status: DC | PRN
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
## 2013-08-04 NOTE — ED Notes (Signed)
Assumed care for discharge.  Discharge instructions and prescription given and reviewed with patient.  Patient verbalized understanding to take medication as directed and [facility].  Patient ambulatory; discharged home in good condition.

## 2013-08-04 NOTE — Discharge Instructions (Signed)
[facility] Doctor List ° ° ° °Edward Hawkins MD. Specialty: Pulmonary Disease Contact information: [address]  °[address]  °[place] [address]  °[phone]  ° °Margaret Simpson, MD. Specialty: Family Medicine Contact information: [address]  °[place] [address]  °[phone]  ° °Scott Luking, MD. Specialty: Family Medicine Contact information: [address]  °[address]  °[place] [address]  °[phone]  ° °Tesfaye Fanta, MD Specialty: Internal Medicine Contact information: [address]  °[place] [address]  °[phone]  ° °Zach Hall, MD. Specialty: Internal Medicine Contact information: [address]  °[place] [address]  °[phone]  ° °Angus Mcinnis, MD. Specialty: Family Medicine Contact information: [address]  °[place] [address]  °[phone]  ° °Stephen Knowlton, MD. Specialty: Family Medicine Contact information: [address]  °[address]  °[place] [address]  °[phone]  ° °Roy Fagan, MD. Specialty: Internal Medicine Contact information: [address]  °[address]  °[place] [address]  °[phone]  ° ° °

## 2013-11-29 ENCOUNTER — Encounter (HOSPITAL_COMMUNITY): Payer: Self-pay | Admitting: Emergency Medicine

## 2013-11-29 ENCOUNTER — Emergency Department (HOSPITAL_COMMUNITY)
Admission: EM | Admit: 2013-11-29 | Discharge: 2013-11-29 | Disposition: A | Discharge status: Home / Self Care | Payer: Self-pay | Attending: Emergency Medicine | Admitting: Emergency Medicine

## 2013-11-29 DIAGNOSIS — F329 Major depressive disorder, single episode, unspecified: Secondary | ICD-10-CM | POA: Insufficient documentation

## 2013-11-29 DIAGNOSIS — Y9389 Activity, other specified: Secondary | ICD-10-CM | POA: Insufficient documentation

## 2013-11-29 DIAGNOSIS — S29012A Strain of muscle and tendon of back wall of thorax, initial encounter: Secondary | ICD-10-CM

## 2013-11-29 DIAGNOSIS — F17200 Nicotine dependence, unspecified, uncomplicated: Secondary | ICD-10-CM | POA: Insufficient documentation

## 2013-11-29 DIAGNOSIS — Z87442 Personal history of urinary calculi: Secondary | ICD-10-CM | POA: Insufficient documentation

## 2013-11-29 DIAGNOSIS — F3289 Other specified depressive episodes: Secondary | ICD-10-CM | POA: Insufficient documentation

## 2013-11-29 DIAGNOSIS — S239XXA Sprain of unspecified parts of thorax, initial encounter: Secondary | ICD-10-CM | POA: Insufficient documentation

## 2013-11-29 DIAGNOSIS — F411 Generalized anxiety disorder: Secondary | ICD-10-CM | POA: Insufficient documentation

## 2013-11-29 DIAGNOSIS — Y929 Unspecified place or not applicable: Secondary | ICD-10-CM | POA: Insufficient documentation

## 2013-11-29 DIAGNOSIS — Z79899 Other long term (current) drug therapy: Secondary | ICD-10-CM | POA: Insufficient documentation

## 2013-11-29 DIAGNOSIS — Z8669 Personal history of other diseases of the nervous system and sense organs: Secondary | ICD-10-CM | POA: Insufficient documentation

## 2013-11-29 DIAGNOSIS — X500XXA Overexertion from strenuous movement or load, initial encounter: Secondary | ICD-10-CM | POA: Insufficient documentation

## 2013-11-29 MED ORDER — HYDROCODONE-ACETAMINOPHEN 5-325 MG PO TABS: ORAL_TABLET | ORAL | Status: DC

## 2013-11-29 MED ORDER — CYCLOBENZAPRINE HCL 10 MG PO TABS: 10.0000 mg | ORAL_TABLET | Freq: Three times a day (TID) | ORAL | Status: DC | PRN

## 2013-11-29 NOTE — Discharge Instructions (Signed)

## 2013-11-29 NOTE — ED Notes (Signed)
Patient c/o upper back pain that started this morning while moving somethings in garage. Patient states "I think I twisted the wrong way."

## 2013-11-29 NOTE — ED Provider Notes (Signed)
CSN: [id number]     Arrival date & time 11/29/13  [id number] History   First MD Initiated Contact with Patient 11/29/13 1013     Chief Complaint  Patient presents with  . Back Pain     (Consider location/radiation/quality/duration/timing/severity/associated sxs/prior Treatment) Patient is a 33 y.o. male presenting with back pain. The history is provided by the patient.  Back Pain Location:  Thoracic spine Quality:  Aching Radiates to:  Does not radiate Pain severity:  Moderate Pain is:  Same all the time Onset quality:  Sudden Timing:  Constant Progression:  Unchanged Chronicity:  New Context: lifting heavy objects and twisting   Context: not falling   Relieved by:  Nothing Worsened by:  Bending, twisting and sitting Ineffective treatments:  None tried Associated symptoms: no abdominal pain, no bladder incontinence, no bowel incontinence, no chest pain, no dysuria, no fever, no headaches, no leg pain, no numbness, no paresthesias, no pelvic pain, no perianal numbness, no tingling and no weakness     Past Medical History  Diagnosis Date  . History of narcotic addiction     methadone tx  . Back pain   . RLS (restless legs syndrome)   . Seizures   . Substance abuse   . Anxiety   . Depression   . Kidney stones    Past Surgical History  Procedure Laterality Date  . Hernia repair    . Wisdom tooth extraction     Family History  Problem Relation Age of Onset  . Cancer Mother     breast cancer survivor   History  Substance Use Topics  . Smoking status: Current Every Day Smoker -- 0.50 packs/day for 20 years    Types: Cigarettes  . Smokeless tobacco: Never Used  . Alcohol Use: No    Review of Systems  Constitutional: Negative for fever.  Respiratory: Negative for shortness of breath.   Cardiovascular: Negative for chest pain.  Gastrointestinal: Negative for vomiting, abdominal pain, constipation and bowel incontinence.  Genitourinary: Negative for bladder incontinence,  dysuria, hematuria, flank pain, decreased urine volume, difficulty urinating and pelvic pain.       No perineal numbness or incontinence of urine or feces  Musculoskeletal: Positive for back pain. Negative for joint swelling.  Skin: Negative for rash.  Neurological: Negative for tingling, weakness, numbness, headaches and paresthesias.  All other systems reviewed and are negative.     Allergies  Ibuprofen and Tylenol  Home Medications   Prior to Admission medications   Medication Sig Start Date End Date Taking? Authorizing Provider  ALPRAZolam Prudy Feeler(XANAX) 0.5 MG tablet Take 1 tablet (0.5 mg total) by mouth 2 (two) times daily as needed for anxiety. 08/04/13   Vida RollerBrian D Miller, MD   BP 135/95  Pulse 74  Temp(Src) 97.7 F (36.5 C) (Oral)  Resp 18  Ht 5\' 9"  (1.753 m)  Wt 150 lb (68.04 kg)  BMI 22.14 kg/m2  SpO2 98% Physical Exam  Nursing note and vitals reviewed. Constitutional: He is oriented to person, place, and time. He appears well-developed and well-nourished. No distress.  HENT:  Head: Normocephalic and atraumatic.  Neck: Normal range of motion. Neck supple.  Cardiovascular: Normal rate, regular rhythm, normal heart sounds and intact distal pulses.   No murmur heard. Pulmonary/Chest: Effort normal and breath sounds normal. No respiratory distress.  Abdominal: Soft. He exhibits no distension. There is no tenderness.  Musculoskeletal: He exhibits tenderness. He exhibits no edema.       Lumbar back: He exhibits  tenderness and pain. He exhibits normal range of motion, no swelling, no deformity, no laceration and normal pulse.       Arms: ttp of the right thoracic paraspinal muscles.  No spinal tenderness.  DP pulses are brisk and symmetrical.  Distal sensation intact.  Grip strength is strong and equal .  Pt has 5/5 strength against resistance of bilateral upper extremities.     Neurological: He is alert and oriented to person, place, and time. He has normal strength. No sensory  deficit. He exhibits normal muscle tone. Coordination and gait normal.  Reflex Scores:      Patellar reflexes are 2+ on the right side and 2+ on the left side.      Achilles reflexes are 2+ on the right side and 2+ on the left side. Skin: Skin is warm and dry. No rash noted.    ED Course  Procedures (including critical care time) Labs Review Labs Reviewed - No data to display  Imaging Review No results found.   EKG Interpretation None      MDM   Final diagnoses:  Muscle strain of right upper back, initial encounter    Pt is well appearing, VSS.  No concerning sx's for PE.  Pain likely related to muscular injury.  Pt agrees to symptomatic treatment with vicodin and flexeril    Kember Boch L. Trisha Mangleriplett, PA-C 12/01/13 2309

## 2013-11-29 NOTE — ED Notes (Signed)
Patient with no complaints at this time. Respirations even and unlabored. Skin warm/dry. Discharge instructions reviewed with patient at this time. Patient given opportunity to voice concerns/ask questions. Patient discharged at this time and left Emergency Department with steady gait.   

## 2013-11-29 NOTE — ED Notes (Signed)
Moving thing around in garage and back started hurting.

## 2013-12-03 NOTE — ED Provider Notes (Signed)
Medical screening examination/treatment/procedure(s) were performed by non-physician practitioner and as supervising physician I was immediately available for consultation/collaboration.   EKG Interpretation None        Joya Gaskinsonald W Kavitha Lansdale, MD 12/03/13 [phone]

## 2014-01-22 ENCOUNTER — Encounter (HOSPITAL_COMMUNITY): Payer: Self-pay | Admitting: Emergency Medicine

## 2014-01-22 ENCOUNTER — Emergency Department (HOSPITAL_COMMUNITY)
Admission: EM | Admit: 2014-01-22 | Discharge: 2014-01-23 | Disposition: A | Discharge status: Home / Self Care | Payer: Self-pay | Attending: Emergency Medicine | Admitting: Emergency Medicine

## 2014-01-22 ENCOUNTER — Emergency Department (HOSPITAL_COMMUNITY): Payer: Self-pay

## 2014-01-22 DIAGNOSIS — F1210 Cannabis abuse, uncomplicated: Secondary | ICD-10-CM | POA: Insufficient documentation

## 2014-01-22 DIAGNOSIS — F1310 Sedative, hypnotic or anxiolytic abuse, uncomplicated: Secondary | ICD-10-CM | POA: Insufficient documentation

## 2014-01-22 DIAGNOSIS — F1910 Other psychoactive substance abuse, uncomplicated: Secondary | ICD-10-CM

## 2014-01-22 DIAGNOSIS — Z79899 Other long term (current) drug therapy: Secondary | ICD-10-CM | POA: Insufficient documentation

## 2014-01-22 DIAGNOSIS — F1110 Opioid abuse, uncomplicated: Secondary | ICD-10-CM | POA: Insufficient documentation

## 2014-01-22 DIAGNOSIS — F411 Generalized anxiety disorder: Secondary | ICD-10-CM | POA: Insufficient documentation

## 2014-01-22 DIAGNOSIS — F3289 Other specified depressive episodes: Secondary | ICD-10-CM | POA: Insufficient documentation

## 2014-01-22 DIAGNOSIS — Z046 Encounter for general psychiatric examination, requested by authority: Secondary | ICD-10-CM | POA: Insufficient documentation

## 2014-01-22 DIAGNOSIS — F329 Major depressive disorder, single episode, unspecified: Secondary | ICD-10-CM | POA: Insufficient documentation

## 2014-01-22 DIAGNOSIS — R4182 Altered mental status, unspecified: Secondary | ICD-10-CM | POA: Insufficient documentation

## 2014-01-22 DIAGNOSIS — Z87442 Personal history of urinary calculi: Secondary | ICD-10-CM | POA: Insufficient documentation

## 2014-01-22 DIAGNOSIS — Z8669 Personal history of other diseases of the nervous system and sense organs: Secondary | ICD-10-CM | POA: Insufficient documentation

## 2014-01-22 DIAGNOSIS — F17200 Nicotine dependence, unspecified, uncomplicated: Secondary | ICD-10-CM | POA: Insufficient documentation

## 2014-01-22 LAB — BASIC METABOLIC PANEL
Anion gap: 14 (ref 5–15)
BUN: 14 mg/dL (ref 6–23)
CHLORIDE: 100 meq/L (ref 96–112)
CO2: 26 mEq/L (ref 19–32)
Calcium: 9.6 mg/dL (ref 8.4–10.5)
Creatinine, Ser: 0.83 mg/dL (ref 0.50–1.35)
GFR calc non Af Amer: >90 mL/min (ref >90)
GLUCOSE: 82 mg/dL (ref 70–99)
Potassium: 3.8 mEq/L (ref 3.7–5.3)
Sodium: 140 mEq/L (ref 137–147)

## 2014-01-22 LAB — HEPATIC FUNCTION PANEL
ALT: 21 U/L (ref 0–53)
AST: 31 U/L (ref 0–37)
Albumin: 4.2 g/dL (ref 3.5–5.2)
Alkaline Phosphatase: 74 U/L (ref 39–117)
BILIRUBIN TOTAL: 0.4 mg/dL (ref 0.3–1.2)
Total Protein: 8 g/dL (ref 6.0–8.3)

## 2014-01-22 LAB — RAPID URINE DRUG SCREEN, HOSP PERFORMED
Amphetamines: NOT DETECTED
BENZODIAZEPINES: POSITIVE — AB
Barbiturates: NOT DETECTED
COCAINE: POSITIVE — AB
Opiates: POSITIVE — AB
TETRAHYDROCANNABINOL: NOT DETECTED

## 2014-01-22 LAB — URINALYSIS, ROUTINE W REFLEX MICROSCOPIC
GLUCOSE, UA: NEGATIVE mg/dL
Leukocytes, UA: NEGATIVE
Nitrite: NEGATIVE
Protein, ur: NEGATIVE mg/dL
Specific Gravity, Urine: >1.03 — ABNORMAL HIGH (ref 1.005–1.030)
Urobilinogen, UA: 0.2 mg/dL (ref 0.0–1.0)
pH: 5.5 (ref 5.0–8.0)

## 2014-01-22 LAB — CBC
HEMATOCRIT: 39.4 % (ref 39.0–52.0)
HEMOGLOBIN: 13.6 g/dL (ref 13.0–17.0)
MCH: 32.5 pg (ref 26.0–34.0)
MCHC: 34.5 g/dL (ref 30.0–36.0)
MCV: 94 fL (ref 78.0–100.0)
Platelets: 273 10*3/uL (ref 150–400)
RBC: 4.19 MIL/uL — ABNORMAL LOW (ref 4.22–5.81)
RDW: 12.3 % (ref 11.5–15.5)
WBC: 5.4 10*3/uL (ref 4.0–10.5)

## 2014-01-22 LAB — URINE MICROSCOPIC-ADD ON

## 2014-01-22 LAB — SALICYLATE LEVEL

## 2014-01-22 LAB — ACETAMINOPHEN LEVEL: Acetaminophen (Tylenol), Serum: <15 ug/mL (ref 10–30)

## 2014-01-22 MED ORDER — NALOXONE HCL 1 MG/ML IJ SOLN
1.0000 mg | Freq: Once | INTRAMUSCULAR | Status: AC
  Administered 2014-01-22: 1 mg via INTRAVENOUS
  Filled 2014-01-22: qty 2

## 2014-01-22 NOTE — ED Notes (Signed)
Pt reports that he woke up this morning "I didn't know what or where". Reports he "thought I was going to a job and the next thing I know police were around the [place]". Pt denies thoughts of harming self or plan but states that he hears voices that tell him to hurt himself. Pt speech is sluggish.

## 2014-01-22 NOTE — ED Notes (Signed)
Patient resting at this time. No distress noted.

## 2014-01-22 NOTE — ED Notes (Signed)
During assessment pt very lethargic and sleepy, had to continue to awaken and call out name to assess, states he had a long day, doesn't see or hear voices telling him to harm self or others, doesn't want to harm himself.

## 2014-01-22 NOTE — ED Notes (Signed)
Dr. Estell Harpin aware of pt. Heart rate.

## 2014-01-22 NOTE — ED Notes (Signed)
Pt given /ml of Naloxone w/o any effect of arousal. EDP notified.

## 2014-01-22 NOTE — ED Notes (Signed)
Pt's contacts Mother Manley Mason Cell [phone] Home [phone] Work [phone]
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
## 2014-01-23 NOTE — Discharge Instructions (Signed)
Follow up with out pt tx for substance abuse °

## 2014-06-19 ENCOUNTER — Encounter (HOSPITAL_COMMUNITY): Payer: Self-pay | Admitting: Emergency Medicine

## 2014-06-19 ENCOUNTER — Emergency Department (HOSPITAL_COMMUNITY)
Admission: EM | Admit: 2014-06-19 | Discharge: 2014-06-19 | Disposition: A | Discharge status: Home / Self Care | Payer: Self-pay | Attending: Emergency Medicine | Admitting: Emergency Medicine

## 2014-06-19 DIAGNOSIS — T148XXA Other injury of unspecified body region, initial encounter: Secondary | ICD-10-CM

## 2014-06-19 DIAGNOSIS — Z87442 Personal history of urinary calculi: Secondary | ICD-10-CM | POA: Insufficient documentation

## 2014-06-19 DIAGNOSIS — Z8669 Personal history of other diseases of the nervous system and sense organs: Secondary | ICD-10-CM | POA: Insufficient documentation

## 2014-06-19 DIAGNOSIS — X58XXXA Exposure to other specified factors, initial encounter: Secondary | ICD-10-CM | POA: Insufficient documentation

## 2014-06-19 DIAGNOSIS — Y9289 Other specified places as the place of occurrence of the external cause: Secondary | ICD-10-CM | POA: Insufficient documentation

## 2014-06-19 DIAGNOSIS — Y998 Other external cause status: Secondary | ICD-10-CM | POA: Insufficient documentation

## 2014-06-19 DIAGNOSIS — S29012A Strain of muscle and tendon of back wall of thorax, initial encounter: Secondary | ICD-10-CM | POA: Insufficient documentation

## 2014-06-19 DIAGNOSIS — F419 Anxiety disorder, unspecified: Secondary | ICD-10-CM | POA: Insufficient documentation

## 2014-06-19 DIAGNOSIS — Z8739 Personal history of other diseases of the musculoskeletal system and connective tissue: Secondary | ICD-10-CM | POA: Insufficient documentation

## 2014-06-19 DIAGNOSIS — Z720 Tobacco use: Secondary | ICD-10-CM | POA: Insufficient documentation

## 2014-06-19 DIAGNOSIS — Y9389 Activity, other specified: Secondary | ICD-10-CM | POA: Insufficient documentation

## 2014-06-19 MED ORDER — ALPRAZOLAM 1 MG PO TABS: 1.0000 mg | ORAL_TABLET | Freq: Two times a day (BID) | ORAL | Status: DC

## 2014-06-19 MED ORDER — CYCLOBENZAPRINE HCL 5 MG PO TABS: 5.0000 mg | ORAL_TABLET | Freq: Three times a day (TID) | ORAL | Status: DC | PRN

## 2014-06-19 MED ORDER — ALPRAZOLAM 0.5 MG PO TABS
1.0000 mg | ORAL_TABLET | Freq: Once | ORAL | Status: AC
  Administered 2014-06-19: 1 mg via ORAL
  Filled 2014-06-19: qty 2

## 2014-06-19 NOTE — ED Notes (Signed)
Pt c/o increased anxiety. Reports his girlfriend is a patient in the ED and his anxiety medication does not appear to be working

## 2014-06-19 NOTE — Discharge Instructions (Signed)
Panic Attacks °Panic attacks are sudden, short feelings of great fear or discomfort. You may have them for no reason when you are relaxed, when you are uneasy (anxious), or when you are sleeping.  °HOME CARE °· Take all your medicines as told. °· Check with your doctor before starting new medicines. °· Keep all doctor visits. °GET HELP IF: °· You are not able to take your medicines as told. °· Your symptoms do not get better. °· Your symptoms get worse. °GET HELP RIGHT AWAY IF: °· Your attacks seem different than your normal attacks. °· You have thoughts about hurting yourself or others. °· You take panic attack medicine and you have a side effect. °MAKE SURE YOU: °· Understand these instructions. °· Will watch your condition. °· Will get help right away if you are not doing well or get worse. °Document Released: 06/19/2010 Document Revised: 03/07/2013 Document Reviewed: 12/29/2012 °ExitCare® Patient Information ©2015 [employer]. This information is not intended to replace advice given to you by your health care provider. Make sure you discuss any questions you have with your health care provider. ° °

## 2014-06-19 NOTE — ED Notes (Signed)
Pt reports taking his Xanax this morning but has not had anything this evening.

## 2014-06-19 NOTE — ED Provider Notes (Signed)
CSN: [id number]     Arrival date & time 06/19/14  2001 History   None    Chief Complaint  Patient presents with  . Anxiety     (Consider location/radiation/quality/duration/timing/severity/associated sxs/prior Treatment) Patient is a 34 y.o. male presenting with anxiety. The history is provided by the patient.  Anxiety This is a new problem.   Andre Lynch is a 34 y.o. male who presents to the ED with anxiety. He reports that he has had panic attacks for years. He had been going to an Urgent Care for his Xanax and they told him he needed to go to a [facility] doctor. He went and they cut his dose back from 1 mg. TID to 0.5 mg BID.  He reports taking his Xanax this morning but is here in the ED with his girlfriend and has not had his evening dose. He does not think the 0.5 mg is strong enough. He needs some medication for now.  He had been through multiple different medications and the Xanax is the one that works best. He denies S/I or H/I. Patient states he was also stretching today and pulled a muscle in the right upper side of his back and want medication for pain.    Past Medical History  Diagnosis Date  . History of narcotic addiction     methadone tx  . Back pain   . RLS (restless legs syndrome)   . Seizures   . Substance abuse   . Anxiety   . Depression   . Kidney stones    Past Surgical History  Procedure Laterality Date  . Hernia repair    . Wisdom tooth extraction     Family History  Problem Relation Age of Onset  . Cancer Mother     breast cancer survivor   History  Substance Use Topics  . Smoking status: Current Every Day Smoker -- 0.50 packs/day for 20 years    Types: Cigarettes  . Smokeless tobacco: Never Used  . Alcohol Use: Yes     Comment: occasionally    Review of Systems Negative except as stated in HPI   Allergies  Ibuprofen and Tylenol  Home Medications   Prior to Admission medications   Medication Sig Start Date End Date Taking? Authorizing  Provider  ALPRAZolam Prudy Feeler) 1 MG tablet Take 1 tablet (1 mg total) by mouth 2 (two) times daily. 06/19/14   [name], NP  cyclobenzaprine (FLEXERIL) 5 MG tablet Take 1 tablet (5 mg total) by mouth 3 (three) times daily as needed for muscle spasms. 06/19/14   [name], NP   BP 119/80 mmHg  Pulse 88  Temp(Src) 97.9 F (36.6 C) (Oral)  Resp 20  Ht  (1.727 m)  Wt 140 lb (63.504 kg)  BMI 21.29 kg/m2  SpO2 97% Physical Exam  Constitutional: He is oriented to person, place, and time. He appears well-developed and well-nourished.  HENT:  Head: Normocephalic.  Eyes: Conjunctivae and EOM are normal.  Neck: Normal range of motion. Neck supple.  Cardiovascular: Normal rate and regular rhythm.   Pulmonary/Chest: Effort normal and breath sounds normal.  Abdominal: Soft. Bowel sounds are normal. There is no tenderness.  Musculoskeletal: Normal range of motion.       Thoracic back: He exhibits tenderness and spasm.       Back:  Neurological: He is alert and oriented to person, place, and time. No cranial nerve deficit.  Skin: Skin is warm and dry.  Psychiatric:  He has a normal mood and affect. His behavior is normal.  Patient does not appear anxious at this time.   Nursing note and vitals reviewed.   ED Course  Procedures  Discussed with Dr. Fayrene FearingJames Xanax 1 mg. PO now and will give 10 tablets Rx Flexeril 5 mg PO for muscle spasm  MDM  34 y.o. male with hx of anxiety and here tonight with friend and has missed his evening dose of Xanax and request medication. Also request referral to PCP due to needing someone for ongoing medication and health care. Referrals given. Stable for discharge without S/I or H/I. Appears calm. Discussed with the patient and all questioned fully answered. He will return if any problems arise.  Final diagnoses:  Anxiety  Muscle strain      [facility] Center[facility] M Neese, [place]NP 06/19/14 [id number]  Rolland PorterMark James, MD 06/22/14 [phone]

## 2014-06-20 ENCOUNTER — Encounter (HOSPITAL_COMMUNITY): Payer: Self-pay

## 2014-06-20 ENCOUNTER — Emergency Department (HOSPITAL_COMMUNITY)
Admission: EM | Admit: 2014-06-20 | Discharge: 2014-06-20 | Disposition: A | Discharge status: Home / Self Care | Payer: Self-pay | Attending: Emergency Medicine | Admitting: Emergency Medicine

## 2014-06-20 DIAGNOSIS — X58XXXA Exposure to other specified factors, initial encounter: Secondary | ICD-10-CM | POA: Insufficient documentation

## 2014-06-20 DIAGNOSIS — Z79899 Other long term (current) drug therapy: Secondary | ICD-10-CM | POA: Insufficient documentation

## 2014-06-20 DIAGNOSIS — Y929 Unspecified place or not applicable: Secondary | ICD-10-CM | POA: Insufficient documentation

## 2014-06-20 DIAGNOSIS — R531 Weakness: Secondary | ICD-10-CM | POA: Insufficient documentation

## 2014-06-20 DIAGNOSIS — F329 Major depressive disorder, single episode, unspecified: Secondary | ICD-10-CM | POA: Insufficient documentation

## 2014-06-20 DIAGNOSIS — R05 Cough: Secondary | ICD-10-CM | POA: Insufficient documentation

## 2014-06-20 DIAGNOSIS — Z8669 Personal history of other diseases of the nervous system and sense organs: Secondary | ICD-10-CM | POA: Insufficient documentation

## 2014-06-20 DIAGNOSIS — Y9389 Activity, other specified: Secondary | ICD-10-CM | POA: Insufficient documentation

## 2014-06-20 DIAGNOSIS — R3919 Other difficulties with micturition: Secondary | ICD-10-CM | POA: Insufficient documentation

## 2014-06-20 DIAGNOSIS — Y998 Other external cause status: Secondary | ICD-10-CM | POA: Insufficient documentation

## 2014-06-20 DIAGNOSIS — Z87891 Personal history of nicotine dependence: Secondary | ICD-10-CM | POA: Insufficient documentation

## 2014-06-20 DIAGNOSIS — Z87442 Personal history of urinary calculi: Secondary | ICD-10-CM | POA: Insufficient documentation

## 2014-06-20 DIAGNOSIS — F419 Anxiety disorder, unspecified: Secondary | ICD-10-CM | POA: Insufficient documentation

## 2014-06-20 DIAGNOSIS — K5900 Constipation, unspecified: Secondary | ICD-10-CM | POA: Insufficient documentation

## 2014-06-20 DIAGNOSIS — S39012A Strain of muscle, fascia and tendon of lower back, initial encounter: Secondary | ICD-10-CM | POA: Insufficient documentation

## 2014-06-20 LAB — URINALYSIS, ROUTINE W REFLEX MICROSCOPIC
Glucose, UA: NEGATIVE mg/dL
KETONES UR: NEGATIVE mg/dL
Leukocytes, UA: NEGATIVE
Nitrite: NEGATIVE
PH: 6.5 (ref 5.0–8.0)
Specific Gravity, Urine: 1.02 (ref 1.005–1.030)
UROBILINOGEN UA: 0.2 mg/dL (ref 0.0–1.0)

## 2014-06-20 LAB — URINE MICROSCOPIC-ADD ON

## 2014-06-20 MED ORDER — ACETAMINOPHEN 325 MG PO TABS: 650.0000 mg | ORAL_TABLET | Freq: Once | ORAL | Status: DC

## 2014-06-20 NOTE — ED Notes (Signed)
Pt c/o pain in lower back since yesterday.  Says was getting some things together for his wife who is in the hospital and hurt his back.  Reports history of back pain.  Reports was given a prescription for flexeril earlier but says already had some at home and it isn't helping.

## 2014-06-20 NOTE — ED Notes (Signed)
Pt voided approximately 3cc. Post void bladder scan >42811ml. Pt attempting to void after bladder scan.

## 2014-06-20 NOTE — Discharge Instructions (Signed)

## 2014-06-20 NOTE — ED Provider Notes (Signed)
CSN: [id number]     Arrival date & time 06/20/14  6962 History  This chart was scribed for Joya Gaskins, MD by Tonye Royalty, ED Scribe. This patient was seen in room APA17/[facility]17 and the patient's care was started at 7:03 AM.    Chief Complaint  Patient presents with  . Back Pain   Patient is a 34 y.o. male presenting with back pain. The history is provided by the patient. No language interpreter was used.  Back Pain Location:  Lumbar spine Quality:  Unable to specify Radiates to:  Does not radiate Pain severity:  Moderate Pain is:  Same all the time Onset quality:  Sudden Duration:  1 day Timing:  Constant Progression:  Unchanged Chronicity:  Recurrent Context: recent illness   Context: not recent injury   Context comment:  "pulled something" Relieved by:  None tried Worsened by:  Nothing tried Ineffective treatments:  Muscle relaxants Associated symptoms: fever and weakness   Associated symptoms: no abdominal pain and no dysuria     HPI Comments: Andre Lynch is a 34 y.o. male who reports history of "throwing my back out" who presents to the Emergency Department complaining of low back pain since he thinks he "pulled something" while bringing some clothes to his girlfriend hospitalized here. He denies recent injury. He reports associated weakness in his legs (reports legs feel "shaky"), constipation, and difficulty urinating, but denies dysuria. He also notes he was recently ill with cough and fever measured at 101 orally 3 days ago. He denies vomiting, abdominal pain, or fecal/urinary incontinence.   Past Medical History  Diagnosis Date  . History of narcotic addiction     methadone tx  . Back pain   . RLS (restless legs syndrome)   . Seizures   . Substance abuse   . Anxiety   . Depression   . Kidney stones    Past Surgical History  Procedure Laterality Date  . Hernia repair    . Wisdom tooth extraction     Family History  Problem Relation Age of Onset  .  Cancer Mother     breast cancer survivor   History  Substance Use Topics  . Smoking status: Former Smoker -- 0.50 packs/day for 20 years    Types: Cigarettes  . Smokeless tobacco: Never Used  . Alcohol Use: Yes     Comment: occasionally    Review of Systems  Constitutional: Positive for fever.  Respiratory: Positive for cough.   Gastrointestinal: Positive for constipation. Negative for vomiting and abdominal pain.  Genitourinary: Positive for difficulty urinating. Negative for dysuria.  Musculoskeletal: Positive for back pain.  Neurological: Positive for weakness.  All other systems reviewed and are negative.     Allergies  Ibuprofen and Tylenol  Home Medications   Prior to Admission medications   Medication Sig Start Date End Date Taking? Authorizing Provider  ALPRAZolam Prudy Feeler) 1 MG tablet Take 1 tablet (1 mg total) by mouth 2 (two) times daily. 06/19/14   [name], NP  cyclobenzaprine (FLEXERIL) 5 MG tablet Take 1 tablet (5 mg total) by mouth 3 (three) times daily as needed for muscle spasms. 06/19/14   [name], NP   BP 104/69 mmHg  Pulse 95  Temp(Src) 97.3 F (36.3 C) (Oral)  Resp 18  Ht  (1.727 m)  Wt 140 lb (63.504 kg)  BMI 21.29 kg/m2  SpO2 95% Physical Exam  Nursing note and vitals reviewed.  CONSTITUTIONAL: Well developed/well nourished HEAD:  Normocephalic/atraumatic EYES: EOMI/PERRL ENMT: Mucous membranes moist NECK: supple no meningeal signs SPINE/BACK:entire spine nontender diffuse thoracic paraspinal tenderness CV: S1/S2 noted, no murmurs/rubs/gallops noted LUNGS: Lungs are clear to auscultation bilaterally, no apparent distress ABDOMEN: soft, nontender, no rebound or guarding GU:no cva tenderness NEURO: Awake/alert, equal motor 5/5 strength noted with the following: hip flexion/knee flexion/extension, foot dorsi/plantar flexion, no clonus bilaterally, no sensory deficit in any dermatome.  Equal patellar/achilles reflex noted (2+) in  bilateral lower extremities.  Pt is able to ambulate unassisted. EXTREMITIES: pulses normal, full ROM SKIN: warm, color normal PSYCH: no abnormalities of mood noted, alert and oriented to situation   ED Course  Procedures   DIAGNOSTIC STUDIES: Oxygen Saturation is 95% on room air, adequate by my interpretation.    COORDINATION OF CARE: 7:08 AM Discussed treatment plan with patient at beside, the patient agrees with the plan and has no further questions at this time. 7:45 AM Pt asleep on repeat exam.  He is very drowsy and is in no distress He is ambulatory without difficulty. He has no focal Lower extremity weakness noted.   He did have some difficulty urinating with mild retention but suspect this could be medication related (just received xanax/flexeril in the ER yesterday) and I doubt he has acute cauda equina syndrome I feel he is safe for d/c home Will defer any home going medications at this time He reports he is not driving home  Labs Review Labs Reviewed  URINALYSIS, ROUTINE W REFLEX MICROSCOPIC - Abnormal; Notable for the following:    APPearance HAZY (*)    Hgb urine dipstick LARGE (*)    Bilirubin Urine SMALL (*)    Protein, ur TRACE (*)    All other components within normal limits  URINE MICROSCOPIC-ADD ON    Medications  acetaminophen (TYLENOL) tablet 650 mg (650 mg Oral Not Given 06/20/14 0725)    MDM   Final diagnoses:  Back strain, initial encounter    Nursing notes including past medical history and social history reviewed and considered in documentation Labs/vital reviewed myself and considered during evaluation Previous records reviewed and considered Narcotic database reviewed and considered in decision making   I personally performed the services described in this documentation, which was scribed in my presence. The recorded information has been reviewed and is accurate.      Joya Gaskinsonald W Brantly Kalman, MD 06/20/14 [phone]

## 2014-08-25 ENCOUNTER — Encounter (HOSPITAL_COMMUNITY): Payer: Self-pay

## 2014-08-25 ENCOUNTER — Emergency Department (HOSPITAL_COMMUNITY)
Admission: EM | Admit: 2014-08-25 | Discharge: 2014-08-26 | Disposition: A | Discharge status: Home / Self Care | Payer: Self-pay | Attending: Emergency Medicine | Admitting: Emergency Medicine

## 2014-08-25 DIAGNOSIS — F1910 Other psychoactive substance abuse, uncomplicated: Secondary | ICD-10-CM

## 2014-08-25 DIAGNOSIS — F329 Major depressive disorder, single episode, unspecified: Secondary | ICD-10-CM | POA: Insufficient documentation

## 2014-08-25 DIAGNOSIS — Z87442 Personal history of urinary calculi: Secondary | ICD-10-CM | POA: Insufficient documentation

## 2014-08-25 DIAGNOSIS — R319 Hematuria, unspecified: Secondary | ICD-10-CM | POA: Insufficient documentation

## 2014-08-25 DIAGNOSIS — F1510 Other stimulant abuse, uncomplicated: Secondary | ICD-10-CM | POA: Insufficient documentation

## 2014-08-25 DIAGNOSIS — Z87891 Personal history of nicotine dependence: Secondary | ICD-10-CM | POA: Insufficient documentation

## 2014-08-25 DIAGNOSIS — F1110 Opioid abuse, uncomplicated: Secondary | ICD-10-CM | POA: Insufficient documentation

## 2014-08-25 DIAGNOSIS — F419 Anxiety disorder, unspecified: Secondary | ICD-10-CM | POA: Insufficient documentation

## 2014-08-25 DIAGNOSIS — Z8669 Personal history of other diseases of the nervous system and sense organs: Secondary | ICD-10-CM | POA: Insufficient documentation

## 2014-08-25 DIAGNOSIS — F1310 Sedative, hypnotic or anxiolytic abuse, uncomplicated: Secondary | ICD-10-CM | POA: Insufficient documentation

## 2014-08-25 DIAGNOSIS — Z79899 Other long term (current) drug therapy: Secondary | ICD-10-CM | POA: Insufficient documentation

## 2014-08-25 DIAGNOSIS — F1410 Cocaine abuse, uncomplicated: Secondary | ICD-10-CM | POA: Insufficient documentation

## 2014-08-25 LAB — URINALYSIS, ROUTINE W REFLEX MICROSCOPIC
Bilirubin Urine: NEGATIVE
GLUCOSE, UA: NEGATIVE mg/dL
Ketones, ur: NEGATIVE mg/dL
Leukocytes, UA: NEGATIVE
Nitrite: NEGATIVE
Urobilinogen, UA: 0.2 mg/dL (ref 0.0–1.0)
pH: 6 (ref 5.0–8.0)

## 2014-08-25 LAB — RAPID URINE DRUG SCREEN, HOSP PERFORMED
AMPHETAMINES: POSITIVE — AB
Barbiturates: NOT DETECTED
Benzodiazepines: POSITIVE — AB
Cocaine: POSITIVE — AB
OPIATES: POSITIVE — AB
Tetrahydrocannabinol: NOT DETECTED

## 2014-08-25 LAB — URINE MICROSCOPIC-ADD ON

## 2014-08-25 NOTE — ED Notes (Signed)
Able to sit on side of bed and give urine sample without assistence

## 2014-08-25 NOTE — ED Notes (Addendum)
Had to shake pt to wake him, states he's in "excruciating pain". No evidence of such with present demeanor.gave pt urinal and asked him for specimen

## 2014-08-25 NOTE — ED Notes (Signed)
Sleeping SOUNDLY.

## 2014-08-25 NOTE — ED Notes (Signed)
Have been waking pt q305min

## 2014-08-25 NOTE — ED Notes (Signed)
Back to sleep, no ua. Woke pt again and asked for urine spec again. Pt responded , "ok i'm trying"

## 2014-08-25 NOTE — ED Notes (Signed)
Patient states that he is having pain in the left side, may be a kidney stone per pt.

## 2014-08-25 NOTE — ED Provider Notes (Addendum)
CSN: [id number]     Arrival date & time 08/25/14  1824 History   First MD Initiated Contact with Patient 08/25/14 2023     Chief Complaint  Patient presents with  . Flank Pain     (Consider location/radiation/quality/duration/timing/severity/associated sxs/prior Treatment) Patient is a 34 y.o. male presenting with flank pain. The history is provided by the patient.  Flank Pain This is a recurrent problem. Pertinent negatives include no chest pain, no abdominal pain, no headaches and no shortness of breath.   patient presents with right flank pain. States it feels like previous kidney stone. Began yesterday. Comes and goes. Last around an hour and improved. Not worse with movement. May have some dysuria. States he had stones a couple years ago. Patient is falling asleep in the room. Denies any substance use. States he did nothing to help with the pain. States he had some nausea with the pain. No fevers. No chest pain. No trauma.  Past Medical History  Diagnosis Date  . History of narcotic addiction     methadone tx  . Back pain   . RLS (restless legs syndrome)   . Seizures   . Substance abuse   . Anxiety   . Depression   . Kidney stones    Past Surgical History  Procedure Laterality Date  . Hernia repair    . Wisdom tooth extraction     Family History  Problem Relation Age of Onset  . Cancer Mother     breast cancer survivor   History  Substance Use Topics  . Smoking status: Former Smoker -- 0.50 packs/day for 20 years    Types: Cigarettes  . Smokeless tobacco: Never Used  . Alcohol Use: Yes     Comment: occasionally    Review of Systems  Constitutional: Negative for activity change and appetite change.  Eyes: Negative for pain.  Respiratory: Negative for chest tightness and shortness of breath.   Cardiovascular: Negative for chest pain and leg swelling.  Gastrointestinal: Negative for nausea, vomiting, abdominal pain and diarrhea.  Endocrine: Negative for polyuria.   Genitourinary: Positive for flank pain.  Musculoskeletal: Negative for back pain and neck stiffness.  Skin: Negative for rash.  Neurological: Negative for weakness, numbness and headaches.  Psychiatric/Behavioral: Negative for behavioral problems.      Allergies  Ibuprofen and Tylenol  Home Medications   Prior to Admission medications   Medication Sig Start Date End Date Taking? Authorizing Provider  ALPRAZolam Prudy Feeler(XANAX) 1 MG tablet Take 1 tablet (1 mg total) by mouth 2 (two) times daily. 06/19/14  Yes Hope Orlene OchM Neese, NP  cyclobenzaprine (FLEXERIL) 5 MG tablet Take 1 tablet (5 mg total) by mouth 3 (three) times daily as needed for muscle spasms. Patient not taking: Reported on 08/25/2014 06/19/14   Janne NapoleonHope M Neese, NP   BP 108/71 mmHg  Pulse 63  Temp(Src) 97.8 F (36.6 C) (Oral)  Resp 16  Ht 5\' 8"  (1.727 m)  Wt 145 lb (65.772 kg)  BMI 22.05 kg/m2  SpO2 98% Physical Exam  Constitutional: He appears well-developed and well-nourished.  HENT:  Head: Normocephalic and atraumatic.  Eyes: EOM are normal.  Pupil seems somewhat sluggish.  Neck: Normal range of motion.  Cardiovascular: Normal rate, regular rhythm and normal heart sounds.   No murmur heard. Pulmonary/Chest: Effort normal and breath sounds normal.  Abdominal: Soft. Bowel sounds are normal. He exhibits no distension and no mass. There is no tenderness. There is no rebound and no guarding.  Genitourinary:  Possible CVA tenderness on right.  Musculoskeletal: Normal range of motion. He exhibits no edema.  Neurological: He is alert.  Patient appears somewhat sleepy. Falls back to sleep quickly after examination.  Skin: Skin is warm.  Psychiatric: He has a normal mood and affect.  Nursing note and vitals reviewed.   ED Course  Procedures (including critical care time) Labs Review Labs Reviewed  URINALYSIS, ROUTINE W REFLEX MICROSCOPIC - Abnormal; Notable for the following:    APPearance HAZY (*)    Specific Gravity,  Urine >1.030 (*)    Hgb urine dipstick TRACE (*)    Protein, ur TRACE (*)    All other components within normal limits  URINE RAPID DRUG SCREEN (HOSP PERFORMED) - Abnormal; Notable for the following:    Opiates POSITIVE (*)    Cocaine POSITIVE (*)    Benzodiazepines POSITIVE (*)    Amphetamines POSITIVE (*)    All other components within normal limits  URINE MICROSCOPIC-ADD ON - Abnormal; Notable for the following:    Bacteria, UA FEW (*)    All other components within normal limits    Imaging Review No results found.   EKG Interpretation None      MDM   Final diagnoses:  Polysubstance abuse  Hematuria    Patient with flank pain. Patient denies taking any medicine and his history of substance abuse. Urine drug screen is positive for multiple drugs. Patient is very sedate. Urinalysis pending at this time.    Benjiman Core, MD 08/25/14 2309  Urine does show possible mild hematuria. Possibility of kidney stone however with patient substance abuse history and lying about substance abuse in the ER. Given narcotics is from her pain meds. Should follow with urology for the hematuria.  Benjiman Core, MD 08/25/14 [phone]

## 2014-08-25 NOTE — Discharge Instructions (Signed)
Polysubstance Abuse °When people abuse more than one drug or type of drug it is called polysubstance or polydrug abuse. For example, many smokers also drink alcohol. This is one form of polydrug abuse. Polydrug abuse also refers to the use of a drug to counteract an unpleasant effect produced by another drug. It may also be used to help with withdrawal from another drug. People who take stimulants may become agitated. Sometimes this agitation is countered with a tranquilizer. This helps protect against the unpleasant side effects. Polydrug abuse also refers to the use of different drugs at the same time.  °Anytime drug use is interfering with normal living activities, it has become abuse. This includes problems with family and friends. Psychological dependence has developed when your mind tells you that the drug is needed. This is usually followed by physical dependence which has developed when continuing increases of drug are required to get the same feeling or "high". This is known as addiction or chemical dependency. A person's risk is much higher if there is a history of chemical dependency in the family. °SIGNS OF CHEMICAL DEPENDENCY °· You have been told by friends or family that drugs have become a problem. °· You fight when using drugs. °· You are having blackouts (not remembering what you do while using). °· You feel sick from using drugs but continue using. °· You lie about use or amounts of drugs (chemicals) used. °· You need chemicals to get you going. °· You are suffering in work performance or in school because of drug use. °· You get sick from use of drugs but continue to use anyway. °· You need drugs to relate to people or feel comfortable in social situations. °· You use drugs to forget problems. °"Yes" answered to any of the above signs of chemical dependency indicates there are problems. The longer the use of drugs continues, the greater the problems will become. °If there is a family history of  drug or alcohol use, it is best not to experiment with these drugs. Continual use leads to tolerance. After tolerance develops more of the drug is needed to get the same feeling. This is followed by addiction. With addiction, drugs become the most important part of life. It becomes more important to take drugs than participate in the other usual activities of life. This includes relating to friends and family. Addiction is followed by dependency. Dependency is a condition where drugs are now needed not just to get high, but to feel normal. °Addiction cannot be cured but it can be stopped. This often requires outside help and the care of professionals. Treatment centers are listed in the yellow pages under: Cocaine, Narcotics, and Alcoholics Anonymous. Most hospitals and clinics can refer you to a specialized care center. Talk to your caregiver if you need help. °Document Released: 01/06/2005 Document Revised: 08/09/2011 Document Reviewed: 05/17/2005 °ExitCare® Patient Information ©2015 [employer], LLC. This information is not intended to replace advice given to you by your health care provider. Make sure you discuss any questions you have with your health care provider. ° ° ° °Emergency Department Resource Guide °1) Find a Doctor and Pay Out of Pocket °Although you won't have to find out who is covered by your insurance plan, it is a good idea to ask around and get recommendations. You will then need to call the office and see if the doctor you have chosen will accept you as a new patient and what types of options they offer for patients who   are self-pay. Some doctors offer discounts or will set up payment plans for their patients who do not have insurance, but you will need to ask so you aren't surprised when you get to your appointment. ° °2) Contact Your Local Health Department °Not all health departments have doctors that can see patients for sick visits, but many do, so it is worth a call to see if yours does. If  you don't know where your local health department is, you can check in your phone book. The CDC also has a tool to help you locate your state's health department, and many state websites also have listings of all of their local health departments. ° °3) Find a Walk-in Clinic °If your illness is not likely to be very severe or complicated, you may want to try a walk in clinic. These are popping up all over the country in pharmacies, drugstores, and shopping centers. They're usually staffed by nurse practitioners or physician assistants that have been trained to treat common illnesses and complaints. They're usually fairly quick and inexpensive. However, if you have serious medical issues or chronic medical problems, these are probably not your best option. ° °No Primary Care Doctor: °- Call [facility] at  832-8000 - they can help you locate a primary care doctor that  accepts your insurance, provides certain services, etc. °- Physician Referral Service- 1-800-533-3463 ° °Chronic Pain Problems: °Organization         Address  Phone   Notes  °[facility]  (336) 297-2271 Patients need to be referred by their primary care doctor.  ° °Medication Assistance: °Organization         Address  Phone   Notes  °[place] [facility] [address] °[place], [place] [address] (336) 641-8030 --Must be a resident of [place] °-- Must have NO insurance coverage whatsoever (no Medicaid/ Medicare, etc.) °-- The pt. MUST have a primary care doctor that directs their care regularly and follows them in the community °  °[facility]  (866) 331-1348   °[employer]  (888) 892-1162   ° °Agencies that provide inexpensive medical care: °Organization         Address  Phone   Notes  °[facility]  (336) 832-8035   °[facility]    (336) 832-7272   °[facility] [address] °[place], [place] [address] (336) 832-4777   °[facility] [address], °[place] (336) 271-4999   °[facility]    (336) 373-0678   °[facility]    (336) 272-1050   °[facility] ° [address], [place] Phone:  (336) 832-4444, Fax:  (336) 832-4440 Hours of Operation:  9 am - 6 pm, M-F.  Also accepts Medicaid/Medicare and self-pay.  °[facility] ° [address], [place] Phone: (336) 832-3150, Fax: (336) [phone]. Hours of Operation:  8:30 am - 5:30 pm, M-F.  Also accepts Medicaid and self-pay.  °[facility] [place] [address], [place] Phone: (336) 878-6027   °[facility] [address], [place], [place] (336)723-1848, Ext. 123 Mondays & Thursdays: 7-9 AM.  First 15 patients are seen on a first come, first serve basis. °  ° °Medicaid-accepting [place] Providers: ° °Organization         Address  Phone   Notes  °[facility] [address], [place] (336) 641-2100 Also accepts self-pay patients.  °[facility] [address], [place] ° (336) 856-9996   °[facility] [address], [place] (336) 288-8857   °[facility] [address] [place] [address], [place] (336) 299-7000   °Veita Bland [address], [place]  ° (336) 373-1557 Only accepts [place] Access Medicaid patients after they have their name applied to their card.  ° °Self-Pay (no insurance) in [place]: ° °Organization         Address  Phone   Notes  °Sickle Cell Patients, [facility] [address], [place] (336) 832-1970   °[facility] [address], [place] (336) 832-4400   °[facility] ° [address], [place] (336) 992-4800   °[facility]/Dr. Osei-Bonsu ° [address] [place] [address], [place] or [address], [place] (336) 841-8500  Phone number for both [place] and [place] locations is the same.  °[facility] [address], [place] (336) 299-0000   °[facility] [address] [place] [address], [place] or [address] (336) 852-7530 °(336) 878-2260   °[facility] [address], [place] (336) 350-1642, phone; (336) 294-5005, fax Sees patients 1st and 3rd Saturday of every month.  Must not qualify for public or private insurance (i.e. Medicaid, Medicare, [place] Health Choice, Veterans' Benefits) • Household income should be no more than 200% of the poverty level •The clinic cannot treat you if you are pregnant or think you are pregnant • Sexually transmitted diseases are not treated at the clinic.  ° ° °Dental Care: °Organization         Address  Phone  Notes  °[facility] [address], [place] (336) 641-6152 Accepts children up to age 21 who are enrolled in Medicaid or [place] Health Choice; pregnant women with a Medicaid card; and children who have applied for Medicaid or [place] Health Choice, but were declined, whose parents can pay a reduced fee at time of service.  °[facility] [place]  [address], [place] (336) 641-7733 Accepts children up to age 21 who are enrolled in Medicaid or [place] Health Choice; pregnant women with a Medicaid card; and children who have applied for Medicaid or [place] Health Choice, but were declined, whose parents can pay a reduced fee at time of service.  °[facility] ° [address],  (336) 641-4533 Patients are seen by appointment only. Walk-ins are not accepted. [facility] will see patients 18 years of age and older. °Monday - Tuesday (8am-5pm) °Most Wednesdays (8:30-5pm) °$30 per visit, cash only  °[facility] ° [address], [place] (336) 641-4533 Patients are seen by appointment  only. Walk-ins are not accepted. [facility] will see patients 18 years of age and older. °One Wednesday Evening (Monthly: Volunteer Based).  $30 per visit, cash only  °[facility]  (919) 537-3737 for adults; Children under age 4, call Graduate Pediatric Dentistry at ([phone]) [phone]. Children aged 4-14, please call (919) [phone] to request a pediatric application. ° Dental services are provided in all areas of dental care including fillings, crowns and bridges, complete and partial dentures, implants, gum treatment, root canals,   and extractions. Preventive care is also provided. Treatment is provided to both adults and children. °Patients are selected via a lottery and there is often a waiting list. °  °[facility] [address], °[place] ° (336) 763-8833 [email or web address] °  °[facility] [address], [place], [place] (336)723-1848, Ext. 123 Second and Fourth Thursday of each month, opens at 6:30 AM; Clinic ends at 9 AM.  Patients are seen on a first-come first-served basis, and a limited number are seen during each clinic.  ° °[facility] ° [address], [place], [place] (336) 723-7904   Eligibility Requirements °You must have lived in [place], [place], or [place] counties for at least the last three months. °  You cannot be eligible for state or federal sponsored healthcare insurance, including Veterans Administration, Medicaid, or Medicare. °  You generally cannot be eligible for healthcare insurance through your employer.  °  How to apply: °Eligibility screenings are held every Tuesday and Wednesday afternoon from 1:00 pm until 4:00 pm. You do not need an appointment for the interview!  °[facility] [address], [place], [place] 336-631-2330   °[facility]  336-342-8273   °[facility]  336-703-3100   °[facility]  336-570-6415   ° °Behavioral Health  Resources in the Community: °Intensive Outpatient Programs °Organization         Address  Phone  Notes  °[facility] [address], [place], [place] 336-878-6098   °[facility] [address], [place], [place] 336-832-9800   °[facility] [address], [place], [place] ° 336-882-2125   °[facility] [address],  °[place], [place] 1-800-853-5163 or 336-641-4981   °Substance Abuse Resources °Organization         Address  Phone  Notes  °[facility]  336-882-2125   °[facility]  336-784-9470   °[facility]  336-285-9073   °[facility]  336-845-3988   °Residential & Outpatient Substance Abuse Program  1-800-659-3381   °Psychological Services °Organization         Address  Phone  Notes  °[facility]  336- 832-9600   °[facility]  336- 378-7881   °[facility] [address], [place] 1-800-853-5163 or 336-641-4981   ° °Mobile Crisis Teams °Organization         Address  Phone  Notes  °[facility], Mobile Crisis Care Unit  1-877-626-1772   °Assertive °[facility] ° [address] [place], [place] 336-834-9664   °Sharon DeEsch [address] °[place] 336-554-5454   ° °Self-Help/Support Groups °Organization         Address  Phone             Notes  °[facility] [place] - variety of support groups  336- 373-1402 Call for more information  °Narcotics Anonymous (NA), [facility] [address], °[place]  2 meetings at this location  ° °Residential Treatment Programs °Organization         Address  Phone  Notes  °[facility] [address],    °[place]  1-866-801-8205   °[facility] ° [address], [place], [place] 704-293-8524   °[facility] [address], [place] 336-845-3988 Admissions: 8am-3pm M-F  °[facility] [address]    °[place], [place] 336-841-1104   °[facility] [address], [place], [place] 336-379-7146   °[facility] [address]  °[place], [place] 336-285-9073   °[facility] Intensive Outpatient [address], [place], [place] 336-852-3033   °[facility] [address]  °[place], [place] 1-877-615-2722 or 336-784-9470   °[facility] [address] [place], [place] 336-227-7417 Accepts Medicaid  °[facility] [address]  °[place] 1-800-659-3381 Substance Abuse/Addiction Treatment  ° °[place] Behavioral Health Resources °Organization         Address  Phone  Notes  °[facility]  (888) 581-9988   °Julie Brannon, PhD [address] [place], [place]   (336) 349-5553 or (336) 951-0000   °[facility]   [address] °[place], [place] (336) 349-4454   °[facility] [address], [place], [place] (336) 342-8316 Insurance/Medicaid/sponsorship through [facility]  °[facility] [address]                                    [place], [place] (336) 342-8316 Therapy/tele-psych/case  °[facility] [address]  ° [place], [place] (336) 349-2233    °Dr. Arfeen  (336) 349-4544   °[facility] [place]  [employer] [place] [facility] 1) [address],  °2) [address], [place] °3)  [address], [place] (336) 349-3220 °(336) 342-7768 ° °(336) 342-8140   °[place] [facility] (336) 342-1394 or (336) 342-3537 (After Hours)    ° ° ° °

## 2014-08-25 NOTE — ED Notes (Signed)
Patient was sleeping in the wheel chair in the waiting room, falling asleep in triage. Denies taking any medications that will cause drowsiness, denies drinking any alcohol.

## 2014-08-25 NOTE — ED Notes (Signed)
Had to shake to wake once again. Did a full neuro exam. Pt did better as he went along with my constant prodding. Able to wake enough to say he takes xanax, but denies taking more than prescribed. States he fell asleep at home and woke with flank pain. Flank pain is intermittent and is not present now

## 2014-08-26 LAB — ETHANOL: Alcohol, Ethyl (B): <5 mg/dL (ref 0–9)

## 2014-08-26 LAB — SALICYLATE LEVEL

## 2014-08-26 LAB — ACETAMINOPHEN LEVEL: Acetaminophen (Tylenol), Serum: <10 ug/mL — ABNORMAL LOW (ref 10–30)

## 2014-08-26 NOTE — ED Notes (Signed)
Awake, re-evaled by dr Lynelle Doctorknapp. Able to get up and dress self, states he found someone to come get him.

## 2014-08-26 NOTE — ED Notes (Signed)
Sleeping, but more easily woken than previous, denies pain now

## 2014-08-26 NOTE — ED Provider Notes (Signed)
Patient left the change of shift to be discharged once he was more awake. Patient is easily awakened. He states he was having left-sided abdominal pain which he thought was a kidney stone. He denies taking any medications other than his prescribed Xanax that he takes once a day. He states he had a kidney stone seen on an x-ray here and a half ago and this ER.  Patient has been sleeping in no distress.  Recheck at 2:30 am easily awakened, states he feels fine. Is unsure why his UDS is +  Plan discharge  Devoria AlbeIva Arjuna Doeden, MD, [facility]FACE[facility]   Tito Ausmus, MD 08/26/14 [phone]

## 2014-10-02 ENCOUNTER — Emergency Department (HOSPITAL_COMMUNITY): Payer: Self-pay

## 2014-10-02 ENCOUNTER — Emergency Department (HOSPITAL_COMMUNITY)
Admission: EM | Admit: 2014-10-02 | Discharge: 2014-10-02 | Disposition: A | Discharge status: Home / Self Care | Payer: Self-pay | Attending: Emergency Medicine | Admitting: Emergency Medicine

## 2014-10-02 ENCOUNTER — Encounter (HOSPITAL_COMMUNITY): Payer: Self-pay | Admitting: Emergency Medicine

## 2014-10-02 DIAGNOSIS — S86911A Strain of unspecified muscle(s) and tendon(s) at lower leg level, right leg, initial encounter: Secondary | ICD-10-CM | POA: Insufficient documentation

## 2014-10-02 DIAGNOSIS — Z87891 Personal history of nicotine dependence: Secondary | ICD-10-CM | POA: Insufficient documentation

## 2014-10-02 DIAGNOSIS — W1839XA Other fall on same level, initial encounter: Secondary | ICD-10-CM | POA: Insufficient documentation

## 2014-10-02 DIAGNOSIS — Y9389 Activity, other specified: Secondary | ICD-10-CM | POA: Insufficient documentation

## 2014-10-02 DIAGNOSIS — Z87442 Personal history of urinary calculi: Secondary | ICD-10-CM | POA: Insufficient documentation

## 2014-10-02 DIAGNOSIS — F419 Anxiety disorder, unspecified: Secondary | ICD-10-CM | POA: Insufficient documentation

## 2014-10-02 DIAGNOSIS — F329 Major depressive disorder, single episode, unspecified: Secondary | ICD-10-CM | POA: Insufficient documentation

## 2014-10-02 DIAGNOSIS — Z8669 Personal history of other diseases of the nervous system and sense organs: Secondary | ICD-10-CM | POA: Insufficient documentation

## 2014-10-02 DIAGNOSIS — Z79899 Other long term (current) drug therapy: Secondary | ICD-10-CM | POA: Insufficient documentation

## 2014-10-02 DIAGNOSIS — Y9289 Other specified places as the place of occurrence of the external cause: Secondary | ICD-10-CM | POA: Insufficient documentation

## 2014-10-02 DIAGNOSIS — Y998 Other external cause status: Secondary | ICD-10-CM | POA: Insufficient documentation

## 2014-10-02 MED ORDER — LORAZEPAM 1 MG PO TABS
1.0000 mg | ORAL_TABLET | Freq: Once | ORAL | Status: AC
  Administered 2014-10-02: 1 mg via ORAL
  Filled 2014-10-02: qty 1

## 2014-10-02 NOTE — Discharge Instructions (Signed)
Your x-ray is negative for fracture, dislocation, or effusion concerning the knee. Please see Dr. Romeo AppleHarrison for additional with a peak evaluation concerning your knee problem.  Please [facility] for assistance with your anxiety medications. Knee Pain Knee pain can be a result of an injury or other medical conditions. Treatment will depend on the cause of your pain. HOME CARE  Only take medicine as told by your doctor.  Keep a healthy weight. Being overweight can make the knee hurt more.  Stretch before exercising or playing sports.  If there is constant knee pain, change the way you exercise. Ask your doctor for advice.  Make sure shoes fit well. Choose the right shoe for the sport or activity.  Protect your knees. Wear kneepads if needed.  Rest when you are tired. GET HELP RIGHT AWAY IF:   Your knee pain does not stop.  Your knee pain does not get better.  Your knee joint feels hot to the touch.  You have a fever. MAKE SURE YOU:   Understand these instructions.  Will watch this condition.  Will get help right away if you are not doing well or get worse. Document Released: 08/13/2008 Document Revised: 08/09/2011 Document Reviewed: 08/13/2008 [facility] Patient Information 2015 [place], [place]. This information is not intended to replace advice given to you by your health care provider. Make sure you discuss any questions you have with your health care provider.

## 2014-10-02 NOTE — ED Provider Notes (Signed)
CSN: [id number]     Arrival date & time 10/02/14  2029 History   First MD Initiated Contact with Patient 10/02/14 2202     Chief Complaint  Patient presents with  . Fall     (Consider location/radiation/quality/duration/timing/severity/associated sxs/prior Treatment) HPI Comments: Patient is a 34 year old male who was doing yard work earlier today when he stepped in a hole, and injured the right knee. He states he heard a loud pop. And he's had soreness of the knee since that time. It is of note that the patient has had problems with the knee for several years. He states that it is worse now than it has been in the past.  The patient also states that he suffers from anxiety, he is treated with Xanax 1 mg. He states that he is in the midst of changing positions, he has been accustomed to receiving 90 tablets per month, and he has been reduced to 30 tablets per month by the new physician. He is requesting to have additional Xanax until he can get back with his physician to negotiate more tablets.  Patient is a 34 y.o. male presenting with fall. The history is provided by the patient.  Fall    Past Medical History  Diagnosis Date  . History of narcotic addiction     methadone tx  . Back pain   . RLS (restless legs syndrome)   . Seizures   . Substance abuse   . Anxiety   . Depression   . Kidney stones    Past Surgical History  Procedure Laterality Date  . Hernia repair    . Wisdom tooth extraction     Family History  Problem Relation Age of Onset  . Cancer Mother     breast cancer survivor   History  Substance Use Topics  . Smoking status: Former Smoker -- 0.50 packs/day for 20 years    Types: Cigarettes  . Smokeless tobacco: Never Used  . Alcohol Use: Yes     Comment: occasionally    Review of Systems  Musculoskeletal: Positive for back pain.  Neurological: Positive for seizures.  Psychiatric/Behavioral: The patient is nervous/anxious.   All other systems reviewed  and are negative.     Allergies  Ibuprofen and Tylenol  Home Medications   Prior to Admission medications   Medication Sig Start Date End Date Taking? Authorizing Provider  ALPRAZolam Prudy Feeler(XANAX) 1 MG tablet Take 1 tablet (1 mg total) by mouth 2 (two) times daily. 06/19/14  Yes Hope Orlene OchM Neese, NP  cyclobenzaprine (FLEXERIL) 5 MG tablet Take 1 tablet (5 mg total) by mouth 3 (three) times daily as needed for muscle spasms. Patient not taking: Reported on 08/25/2014 06/19/14   Janne NapoleonHope M Neese, NP   BP 111/77 mmHg  Pulse 96  Temp(Src) 98.1 F (36.7 C)  Resp 18  Ht 5\' 8"  (1.727 m)  Wt 140 lb (63.504 kg)  BMI 21.29 kg/m2  SpO2 100% Physical Exam  Constitutional: He is oriented to person, place, and time. He appears well-developed and well-nourished.  Non-toxic appearance.  HENT:  Head: Normocephalic.  Right Ear: Tympanic membrane and external ear normal.  Left Ear: Tympanic membrane and external ear normal.  Eyes: EOM and lids are normal. Pupils are equal, round, and reactive to light.  Neck: Normal range of motion. Neck supple. Carotid bruit is not present.  Cardiovascular: Normal rate, regular rhythm, normal heart sounds, intact distal pulses and normal pulses.   Pulmonary/Chest: Breath sounds normal. No respiratory distress.  Abdominal: Soft. Bowel sounds are normal. There is no tenderness. There is no guarding.  Musculoskeletal: Normal range of motion.  There is good range of motion of the right hip. There is a brace on the right knee. There is no deformity of the quadricep area. There is no palpable effusion present. No deformity of the anterior tibial tuberosity. The Achilles tendon is intact. The dorsalis pedis pulses 2+.  Lymphadenopathy:       Head (right side): No submandibular adenopathy present.       Head (left side): No submandibular adenopathy present.    He has no cervical adenopathy.  Neurological: He is alert and oriented to person, place, and time. He has normal strength.  No cranial nerve deficit or sensory deficit.  Skin: Skin is warm and dry.  Psychiatric: He has a normal mood and affect. His speech is normal.  Nursing note and vitals reviewed.   ED Course  Procedures (including critical care time) Labs Review Labs Reviewed - No data to display  Imaging Review Dg Knee Complete 4 Views Right  10/02/2014   CLINICAL DATA:  Stepped in hole today with knee pain, initial encounter  EXAM: RIGHT KNEE - COMPLETE 4+ VIEW  COMPARISON:  None.  FINDINGS: There is no evidence of fracture, dislocation, or joint effusion. There is no evidence of arthropathy or other focal bone abnormality. Soft tissues are unremarkable.  IMPRESSION: No acute abnormality noted.   Electronically Signed   By: Alcide CleverMark  Lukens M.D.   On: 10/02/2014 21:21     EKG Interpretation None      MDM  Vital signs within normal limits. Pulse oximetry is 100% on room air. Within normal limits by my interpretation. X-ray of the right knee shows no fracture, no dislocation, and no effusion.  I discussed the findings of the patient in terms which he understands. I have asked the patient to continue using his splint, and to follow-up with orthopedics if not improving.  I explained to the patient that he would need to see physicians at the [facility], or behavioral health for assistance with his anxiety medications. The patient is given Ativan 1 mg here in the emergency department.    Final diagnoses:  None    *I have reviewed nursing notes, vital signs, and all appropriate lab and imaging results for this patient.**    Ivery QualeHobson Terilyn Sano, PA-C 10/02/14 2219  Raeford RazorStephen Kohut, MD 10/05/14 [phone]

## 2014-10-02 NOTE — ED Notes (Signed)
Patient states he was doing some yard work and got light headed and fell.  Patient c/o right knee pain.

## 2014-11-01 ENCOUNTER — Emergency Department (HOSPITAL_COMMUNITY)
Admission: EM | Admit: 2014-11-01 | Discharge: 2014-11-01 | Disposition: A | Discharge status: Home / Self Care | Payer: Self-pay | Attending: Emergency Medicine | Admitting: Emergency Medicine

## 2014-11-01 ENCOUNTER — Encounter (HOSPITAL_COMMUNITY): Payer: Self-pay

## 2014-11-01 ENCOUNTER — Emergency Department (HOSPITAL_COMMUNITY): Payer: Self-pay

## 2014-11-01 DIAGNOSIS — Z87442 Personal history of urinary calculi: Secondary | ICD-10-CM | POA: Insufficient documentation

## 2014-11-01 DIAGNOSIS — M7918 Myalgia, other site: Secondary | ICD-10-CM

## 2014-11-01 DIAGNOSIS — Z79899 Other long term (current) drug therapy: Secondary | ICD-10-CM | POA: Insufficient documentation

## 2014-11-01 DIAGNOSIS — Z8669 Personal history of other diseases of the nervous system and sense organs: Secondary | ICD-10-CM | POA: Insufficient documentation

## 2014-11-01 DIAGNOSIS — Z87891 Personal history of nicotine dependence: Secondary | ICD-10-CM | POA: Insufficient documentation

## 2014-11-01 DIAGNOSIS — F329 Major depressive disorder, single episode, unspecified: Secondary | ICD-10-CM | POA: Insufficient documentation

## 2014-11-01 DIAGNOSIS — R109 Unspecified abdominal pain: Secondary | ICD-10-CM | POA: Insufficient documentation

## 2014-11-01 DIAGNOSIS — M791 Myalgia: Secondary | ICD-10-CM | POA: Insufficient documentation

## 2014-11-01 DIAGNOSIS — F419 Anxiety disorder, unspecified: Secondary | ICD-10-CM | POA: Insufficient documentation

## 2014-11-01 LAB — URINALYSIS, ROUTINE W REFLEX MICROSCOPIC
Bilirubin Urine: NEGATIVE
GLUCOSE, UA: NEGATIVE mg/dL
Ketones, ur: NEGATIVE mg/dL
Leukocytes, UA: NEGATIVE
NITRITE: NEGATIVE
Protein, ur: NEGATIVE mg/dL
Specific Gravity, Urine: 1.015 (ref 1.005–1.030)
Urobilinogen, UA: 0.2 mg/dL (ref 0.0–1.0)
pH: 5.5 (ref 5.0–8.0)

## 2014-11-01 LAB — RAPID URINE DRUG SCREEN, HOSP PERFORMED
Amphetamines: NOT DETECTED
Barbiturates: NOT DETECTED
Benzodiazepines: POSITIVE — AB
Cocaine: POSITIVE — AB
OPIATES: NOT DETECTED
Tetrahydrocannabinol: NOT DETECTED

## 2014-11-01 LAB — URINE MICROSCOPIC-ADD ON

## 2014-11-01 MED ORDER — SODIUM CHLORIDE 0.9 % IJ SOLN
INTRAMUSCULAR | Status: AC
  Filled 2014-11-01: qty 48

## 2014-11-01 MED ORDER — CYCLOBENZAPRINE HCL 5 MG PO TABS: 5.0000 mg | ORAL_TABLET | Freq: Three times a day (TID) | ORAL | Status: DC | PRN

## 2014-11-01 NOTE — ED Notes (Signed)
Patient c/o right flank pain that started at 0300.

## 2014-11-01 NOTE — Discharge Instructions (Signed)
Your CT scan tonight does not show any kidney stones in either side. Take the flexeril for your pain. Recheck as neede.

## 2014-11-01 NOTE — ED Provider Notes (Signed)
CSN: [id number]     Arrival date & time 11/01/14  0509 History   First MD Initiated Contact with Patient 11/01/14 0536     Chief Complaint  Patient presents with  . Flank Pain     (Consider location/radiation/quality/duration/timing/severity/associated sxs/prior Treatment) HPI  Patient states he was diagnosed with kidney stones a few years ago. He states about midnight he started getting right sided abdominal pain that he states would last about an hour and come and go. He describes the pain is sharp. He states nothing he did make it hurt more, nothing he did make it hurt less. He denies nausea, vomiting, dysuria, frequency, or hematuria. He denies any change in activity or injury. He states his last pain was about 2 AM this morning.  PCP Dr Kathie RhodesBetty [place] [facility]  Past Medical History  Diagnosis Date  . History of narcotic addiction     methadone tx  . Back pain   . RLS (restless legs syndrome)   . Seizures   . Substance abuse   . Anxiety   . Depression   . Kidney stones    Past Surgical History  Procedure Laterality Date  . Hernia repair    . Wisdom tooth extraction     Family History  Problem Relation Age of Onset  . Cancer Mother     breast cancer survivor   History  Substance Use Topics  . Smoking status: Former Smoker -- 1.00 packs/day for 20 years    Types: Cigarettes  . Smokeless tobacco: Never Used  . 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     Allergies  Ibuprofen and Tylenol  Home Medications   Prior to Admission medications   Medication Sig Start Date End Date Taking? Authorizing Provider  ALPRAZolam Prudy Feeler(XANAX) 1 MG tablet Take 1 tablet (1 mg total) by mouth 2 (two) times daily. 06/19/14   Hope Orlene OchM Neese, NP  cyclobenzaprine (FLEXERIL) 5 MG tablet Take 1 tablet (5 mg total) by mouth 3 (three) times daily as needed. 11/01/14   Devoria AlbeIva Journiee Feldkamp,  MD   BP 138/98 mmHg  Pulse 90  Temp(Src) 98.2 F (36.8 C) (Oral)  Resp 20  Ht 5\' 8"  (1.727 m)  Wt 140 lb (63.504 kg)  BMI 21.29 kg/m2  SpO2 100%  Vital signs normal   Physical Exam  Constitutional: He is oriented to person, place, and time. He appears well-developed and well-nourished.  Non-toxic appearance. He does not appear ill. No distress.  Sleeping in no distress  HENT:  Head: Normocephalic and atraumatic.  Right Ear: External ear normal.  Left Ear: External ear normal.  Nose: Nose normal. No mucosal edema or rhinorrhea.  Mouth/Throat: Oropharynx is clear and moist and mucous membranes are normal. No dental abscesses or uvula swelling.  Eyes: Conjunctivae and EOM are normal. Pupils are equal, round, and reactive to light.  Neck: Normal range of motion and full passive range of motion without pain. Neck supple.  Cardiovascular: Normal rate, regular rhythm and normal heart sounds.  Exam reveals no gallop and no friction rub.   No murmur heard. Pulmonary/Chest: Effort normal and breath sounds normal. No respiratory distress. He has no wheezes. He has no rhonchi. He has no rales. He exhibits no tenderness and no crepitus.  Abdominal: Soft. Normal appearance and bowel sounds are normal. He exhibits no distension. There is  no tenderness. There is no rebound and no guarding.  Genitourinary:  Right CVAT  Musculoskeletal: Normal range of motion. He exhibits no edema or tenderness.  Moves all extremities well.   Neurological: He is alert and oriented to person, place, and time. He has normal strength. No cranial nerve deficit.  Skin: Skin is warm, dry and intact. No rash noted. No erythema. No pallor.  Psychiatric: He has a normal mood and affect. His speech is normal and behavior is normal. His mood appears not anxious.  Nursing note and vitals reviewed.   ED Course  Procedures (including critical care time)  Medications - No data to display  At the time of my exam patient  was pain-free. He states his last pain was at 2 AM.  Labs Review Results for orders placed or performed during the hospital encounter of 11/01/14  Urinalysis, Routine w reflex microscopic  Result Value Ref Range   Color, Urine YELLOW YELLOW   APPearance CLEAR CLEAR   Specific Gravity, Urine 1.015 1.005 - 1.030   pH 5.5 5.0 - 8.0   Glucose, UA NEGATIVE NEGATIVE mg/dL   Hgb urine dipstick TRACE (A) NEGATIVE   Bilirubin Urine NEGATIVE NEGATIVE   Ketones, ur NEGATIVE NEGATIVE mg/dL   Protein, ur NEGATIVE NEGATIVE mg/dL   Urobilinogen, UA 0.2 0.0 - 1.0 mg/dL   Nitrite NEGATIVE NEGATIVE   Leukocytes, UA NEGATIVE NEGATIVE  Urine microscopic-add on  Result Value Ref Range   RBC / HPF 0-2 <3 RBC/hpf  Urine rapid drug screen (hosp performed)  Result Value Ref Range   Opiates NONE DETECTED NONE DETECTED   Cocaine POSITIVE (A) NONE DETECTED   Benzodiazepines POSITIVE (A) NONE DETECTED   Amphetamines NONE DETECTED NONE DETECTED   Tetrahydrocannabinol NONE DETECTED NONE DETECTED   Barbiturates NONE DETECTED NONE DETECTED   Laboratory interpretation all normal except microscopic hematuria  +UDS   Imaging Review Ct Renal Stone Study  11/01/2014   CLINICAL DATA:  Initial evaluation for acute right flank pain.  EXAM: CT ABDOMEN AND PELVIS WITHOUT CONTRAST  TECHNIQUE: Multidetector CT imaging of the abdomen and pelvis was performed following the standard protocol without IV contrast.  COMPARISON:  Prior CT from 02/02/2013  FINDINGS: Visualized lung bases are clear.  The liver demonstrates a normal unenhanced appearance. Gallbladder largely decompressed. No biliary dilatation. Spleen, adrenal glands, and pancreas within normal limits.  Kidneys are equal in size without evidence of nephrolithiasis or hydronephrosis. No radiopaque calculi seen along the course of either renal collecting system. No hydroureter.  Stomach within normal limits. No evidence for bowel obstruction. Appendix visualized in the  right lower quadrant and is of normal caliber and appearance without evidence for acute appendicitis. No acute inflammatory changes seen about the bowels.  Bladder within normal limits.  Prostate normal.  No free air or fluid. No pathologically enlarged intra-abdominal or pelvic lymph nodes identified.  Scoliosis noted.  No acute osseous abnormality.  IMPRESSION: 1. No CT evidence for nephrolithiasis or obstructive uropathy. 2. No other acute intra-abdominal or pelvic process identified. 3. Normal appendix.   Electronically Signed   By: Rise Mu M.D.   On: 11/01/2014 06:57     EKG Interpretation None      MDM   Final diagnoses:  Right flank pain  Musculoskeletal pain    Discharge Medication List as of 11/01/2014  7:26 AM      Plan discharge  Devoria Albe, MD, Concha Pyo, MD 11/04/14 2307

## 2014-12-24 ENCOUNTER — Emergency Department (HOSPITAL_COMMUNITY)
Admission: EM | Admit: 2014-12-24 | Discharge: 2014-12-24 | Disposition: A | Discharge status: Home / Self Care | Payer: Self-pay | Attending: Emergency Medicine | Admitting: Emergency Medicine

## 2014-12-24 ENCOUNTER — Encounter (HOSPITAL_COMMUNITY): Payer: Self-pay

## 2014-12-24 DIAGNOSIS — Z8669 Personal history of other diseases of the nervous system and sense organs: Secondary | ICD-10-CM | POA: Insufficient documentation

## 2014-12-24 DIAGNOSIS — Z87442 Personal history of urinary calculi: Secondary | ICD-10-CM | POA: Insufficient documentation

## 2014-12-24 DIAGNOSIS — F329 Major depressive disorder, single episode, unspecified: Secondary | ICD-10-CM | POA: Insufficient documentation

## 2014-12-24 DIAGNOSIS — F419 Anxiety disorder, unspecified: Secondary | ICD-10-CM | POA: Insufficient documentation

## 2014-12-24 DIAGNOSIS — Z87891 Personal history of nicotine dependence: Secondary | ICD-10-CM | POA: Insufficient documentation

## 2014-12-24 DIAGNOSIS — R319 Hematuria, unspecified: Secondary | ICD-10-CM | POA: Insufficient documentation

## 2014-12-24 LAB — URINALYSIS, ROUTINE W REFLEX MICROSCOPIC
Bilirubin Urine: NEGATIVE
GLUCOSE, UA: NEGATIVE mg/dL
Ketones, ur: NEGATIVE mg/dL
Leukocytes, UA: NEGATIVE
NITRITE: NEGATIVE
PH: 5.5 (ref 5.0–8.0)
PROTEIN: NEGATIVE mg/dL
UROBILINOGEN UA: 0.2 mg/dL (ref 0.0–1.0)

## 2014-12-24 LAB — URINE MICROSCOPIC-ADD ON

## 2014-12-24 MED ORDER — ALPRAZOLAM 1 MG PO TABS: 1.0000 mg | ORAL_TABLET | Freq: Two times a day (BID) | ORAL | Status: DC | PRN

## 2014-12-24 NOTE — ED Notes (Signed)
Pt reports ran out of xanax yesterday.  Says his doctor usually prescribes him 63 but this time only prescribed him 30.  Pt c/o feeling anxious.  Also reports had pain in rlq for approx 1 hour this morning.  No vomiting.

## 2014-12-24 NOTE — ED Provider Notes (Signed)
CSN: [id number]     Arrival date & time 12/24/14  0654 History   First MD Initiated Contact with Patient 12/24/14 [phone]     Chief Complaint  Patient presents with  . Anxiety     (Consider location/radiation/quality/duration/timing/severity/associated sxs/prior Treatment) Patient is a 34 y.o. male presenting with anxiety. The history is provided by the patient (the pt complains of axiety and pain right flank pain.  pt has ran out of his xanax and has a hx of kidney stone).  Anxiety This is a recurrent problem. The current episode started more than 2 days ago. The problem occurs constantly. The problem has not changed since onset.Associated symptoms include abdominal pain. Pertinent negatives include no chest pain and no headaches. Nothing aggravates the symptoms. Nothing relieves the symptoms.    Past Medical History  Diagnosis Date  . History of narcotic addiction     methadone tx  . Back pain   . RLS (restless legs syndrome)   . Seizures   . Substance abuse   . Anxiety   . Depression   . Kidney stones    Past Surgical History  Procedure Laterality Date  . Hernia repair    . Wisdom tooth extraction     Family History  Problem Relation Age of Onset  . Cancer Mother     breast cancer survivor   History  Substance Use Topics  . Smoking status: Former Smoker -- 1.00 packs/day for 20 years    Types: Cigarettes  . Smokeless tobacco: Never Used  . Alcohol Use: No     Comment: occasionally    Review of Systems  Constitutional: Negative for appetite change and fatigue.  HENT: Negative for congestion, ear discharge and sinus pressure.   Eyes: Negative for discharge.  Respiratory: Negative for cough.   Cardiovascular: Negative for chest pain.  Gastrointestinal: Positive for abdominal pain. Negative for diarrhea.  Genitourinary: Negative for frequency and hematuria.  Musculoskeletal: Negative for back pain.  Skin: Negative for rash.  Neurological: Negative for seizures and  headaches.  Psychiatric/Behavioral: Positive for agitation. Negative for hallucinations.      Allergies  Ibuprofen and Tylenol  Home Medications   Prior to Admission medications   Medication Sig Start Date End Date Taking? Authorizing Provider  ALPRAZolam Prudy Feeler) 1 MG tablet Take 1 tablet (1 mg total) by mouth 2 (two) times daily as needed for anxiety. 12/24/14   Bethann Berkshire, MD  cyclobenzaprine (FLEXERIL) 5 MG tablet Take 1 tablet (5 mg total) by mouth 3 (three) times daily as needed. 11/01/14   Devoria Albe, MD   BP 117/83 mmHg  Pulse 91  Temp(Src) 97.8 F (36.6 C)  Resp 18  Ht 5\' 8"  (1.727 m)  Wt 135 lb (61.236 kg)  BMI 20.53 kg/m2  SpO2 98% Physical Exam  Constitutional: He is oriented to person, place, and time. He appears well-developed.  HENT:  Head: Normocephalic.  Eyes: Conjunctivae and EOM are normal. No scleral icterus.  Neck: Neck supple. No thyromegaly present.  Cardiovascular: Normal rate and regular rhythm.  Exam reveals no gallop and no friction rub.   No murmur heard. Pulmonary/Chest: No stridor. He has no wheezes. He has no rales. He exhibits no tenderness.  Abdominal: He exhibits no distension. There is no tenderness. There is no rebound.  Musculoskeletal: Normal range of motion. He exhibits no edema.  Lymphadenopathy:    He has no cervical adenopathy.  Neurological: He is oriented to person, place, and time. He exhibits normal muscle tone.  Coordination normal.  Skin: No rash noted. No erythema.  Psychiatric: His behavior is normal.  Mildly anxious    ED Course  Procedures (including critical care time) Labs Review Labs Reviewed  URINALYSIS, ROUTINE W REFLEX MICROSCOPIC (NOT AT [facility]) - Abnormal; Notable for the following:    Specific Gravity, Urine <1.005 (*)    Hgb urine dipstick LARGE (*)    All other components within normal limits  URINE MICROSCOPIC-ADD ON    Imaging Review No results found.   EKG Interpretation None      MDM   Final  diagnoses:  Anxiety  Hematuria    Anxiety will refill xanax for 10 days.  Hematuria without pain now.  Possibly kidney stone.  Pt will hold off on more studies 2nd to insurance and funds.  Pt will follow up with urology    Bethann Berkshire, MD 12/24/14 [phone]

## 2014-12-24 NOTE — Discharge Instructions (Signed)
Follow up with [facility] for your hematuria and follow up with your family md  For anxiety medicine

## 2015-09-09 ENCOUNTER — Encounter (HOSPITAL_COMMUNITY): Payer: Self-pay | Admitting: Emergency Medicine

## 2015-09-09 ENCOUNTER — Emergency Department (HOSPITAL_COMMUNITY)
Admission: EM | Admit: 2015-09-09 | Discharge: 2015-09-09 | Discharge status: Against Medical Advice | Payer: Self-pay | Attending: Emergency Medicine | Admitting: Emergency Medicine

## 2015-09-09 DIAGNOSIS — F329 Major depressive disorder, single episode, unspecified: Secondary | ICD-10-CM | POA: Insufficient documentation

## 2015-09-09 DIAGNOSIS — F17210 Nicotine dependence, cigarettes, uncomplicated: Secondary | ICD-10-CM | POA: Insufficient documentation

## 2015-09-09 DIAGNOSIS — Z765 Malingerer [conscious simulation]: Secondary | ICD-10-CM

## 2015-09-09 DIAGNOSIS — Z7289 Other problems related to lifestyle: Secondary | ICD-10-CM | POA: Insufficient documentation

## 2015-09-09 HISTORY — DX: Other chronic pain: G89.29

## 2015-09-09 HISTORY — DX: Pain in unspecified knee: M25.569

## 2015-09-09 NOTE — ED Notes (Signed)
PT c/o increased anxiety with panic attacks x2 weeks and denies and SI/HI. PT states he has no insurance at this time and has no PCP.

## 2015-09-09 NOTE — ED Notes (Signed)
Front desk called back to ED secretary to report that pt walked out the ED entrance. RN went to pt's room and the pt was not present. RN assumes pt left AMA.

## 2015-09-09 NOTE — ED Provider Notes (Signed)
CSN: [id number]     Arrival date & time 09/09/15  [id number] History   First MD Initiated Contact with Patient 09/09/15 0801     Chief Complaint  Patient presents with  . Panic Attack      HPI Pt was seen at 0800. Per pt, c/o gradual onset and persistence of multiple intermittent episodes of acute flair of his chronic "panic attacks" for the past 2 weeks. States his symptoms began after he ran out of his xanax. States he "doesn't have a doctor and doesn't have insurance right now" and is requesting a prescription "to hold me over." Denies SI, no HI, no hallucinations.    Past Medical History  Diagnosis Date  . History of narcotic addiction (HCC)     methadone tx  . Back pain   . RLS (restless legs syndrome)   . Seizures (HCC)   . Substance abuse     cocaine, benzos, marijuana, benzos, opiates  . Anxiety   . Depression   . Kidney stones   . Chronic knee pain    Past Surgical History  Procedure Laterality Date  . Hernia repair    . Wisdom tooth extraction     Family History  Problem Relation Age of Onset  . Cancer Mother     breast cancer survivor   Social History  Substance Use Topics  . Smoking status: Current Every Day Smoker -- 0.50 packs/day for 20 years    Types: Cigarettes  . Smokeless tobacco: Never Used  . Alcohol Use: No     Comment: occasionally    Review of Systems ROS: Statement: All systems negative except as marked or noted in the HPI; Constitutional: Negative for fever and chills. ; ; Eyes: Negative for eye pain, redness and discharge. ; ; ENMT: Negative for ear pain, hoarseness, nasal congestion, sinus pressure and sore throat. ; ; Cardiovascular: Negative for chest pain, palpitations, diaphoresis, dyspnea and peripheral edema. ; ; Respiratory: Negative for cough, wheezing and stridor. ; ; Gastrointestinal: Negative for nausea, vomiting, diarrhea, abdominal pain, blood in stool, hematemesis, jaundice and rectal bleeding. . ; ; Genitourinary: Negative for  dysuria, flank pain and hematuria. ; ; Musculoskeletal: Negative for back pain and neck pain. Negative for swelling and trauma.; ; Skin: Negative for pruritus, rash, abrasions, blisters, bruising and skin lesion.; ; Neuro: Negative for headache, lightheadedness and neck stiffness. Negative for weakness, altered level of consciousness , altered mental status, extremity weakness, paresthesias, involuntary movement, seizure and syncope.; Psych:  +anxiety. No SI, no SA, no HI, no hallucinations.        Allergies  Ibuprofen and Tylenol  Home Medications   Prior to Admission medications   Medication Sig Start Date End Date Taking? Authorizing Provider  ALPRAZolam Prudy Feeler(XANAX) 1 MG tablet Take 1 tablet (1 mg total) by mouth 2 (two) times daily as needed for anxiety. 12/24/14   Bethann BerkshireJoseph Zammit, MD  cyclobenzaprine (FLEXERIL) 5 MG tablet Take 1 tablet (5 mg total) by mouth 3 (three) times daily as needed. 11/01/14   Devoria AlbeIva Knapp, MD   BP 122/79 mmHg  Pulse 99  Temp(Src) 97.9 F (36.6 C) (Oral)  Resp 18  Ht 5\' 10"  (1.778 m)  Wt 145 lb (65.772 kg)  BMI 20.81 kg/m2  SpO2 99% Physical Exam  0805: Physical examination:  Nursing notes reviewed; Vital signs and O2 SAT reviewed;  Constitutional: Well developed, Well nourished, Well hydrated, In no acute distress; Head:  Normocephalic, atraumatic; Eyes: EOMI, PERRL, No scleral icterus; ENMT: Mouth  and pharynx normal, Mucous membranes moist; Neck: Supple, Full range of motion; Cardiovascular: Regular rate and rhythm; Respiratory: Breath sounds clear, No wheezes.  Speaking full sentences with ease, Normal respiratory effort/excursion; Chest: No deformity, Movement normal; Abdomen: Nondistended; Extremities: No deformity.; Neuro: AA&Ox3, Major CN grossly intact.  Speech clear. No gross focal motor deficits in extremities. Climbs on and off stretcher easily by himself. Gait steady.; Skin: Color normal, Warm, Dry.; Psych:  Full. Denies SI.    ED Course  Procedures  (including critical care time) Labs Review  Imaging Review  I have personally reviewed and evaluated these images and lab results as part of my medical decision-making.   EKG Interpretation None      MDM  MDM Reviewed: previous chart, nursing note and vitals Reviewed previous: labs Interpretation: labs      0805:  Pt stating he is out of his benzo (xanax) and requesting I prescribe him "some medicine for anxiety" "like other people have" "to get me through." EPIC chart reviewed: pt has hx of similar statements with f/u +UDS for opiates and benzos dating back several years. Informed he will not receive benzo rx today from the ED. Pt verb understanding.   0815:  Pt seen by front desk staff walking out of the ED. Eloped. Concern for drug seeking behavior.   Samuel Jester, DO 09/09/15 [phone]

## 2015-10-01 IMAGING — CT CT RENAL STONE PROTOCOL
3 of 4 series · 9 of 46 positions shown, 16 images · non-contrast
Comparison: Prior CT from 02/02/2013

CLINICAL DATA: Initial evaluation for acute right flank pain.

EXAM:
CT ABDOMEN AND PELVIS WITHOUT CONTRAST
TECHNIQUE: Multidetector CT imaging of the abdomen and pelvis was performed
following the standard protocol without IV contrast.

[Series 3: mpr coronal (id) · coronal · 0.83mm/px · 3 of 62 slices shown, 4 images]
[im 21/62  soft-tissue]
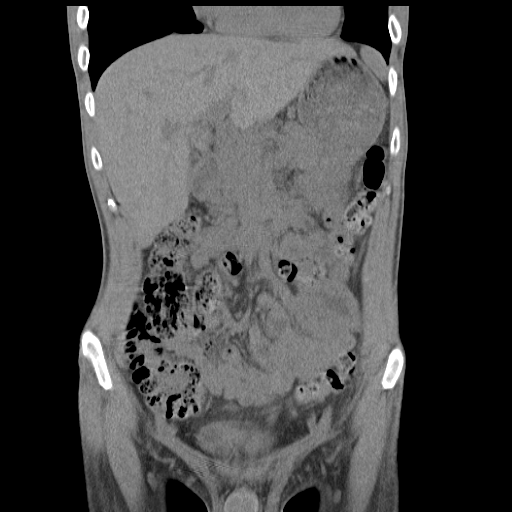
[im 28/62  soft-tissue]
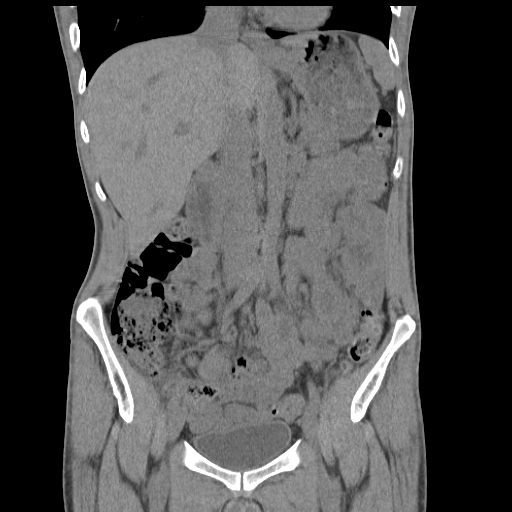
[im 28/62  bone]
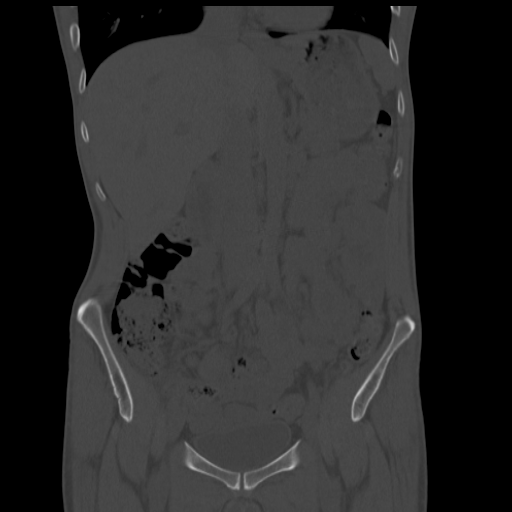
[im 34/62  soft-tissue]
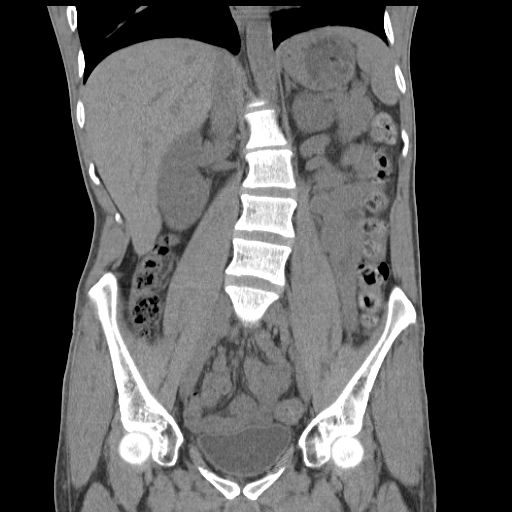

[Series 4: mpr sagittal (id) · sagittal · 0.38mm/px · 1 of 99 slices shown, 2 images]
[im 33/99  soft-tissue]
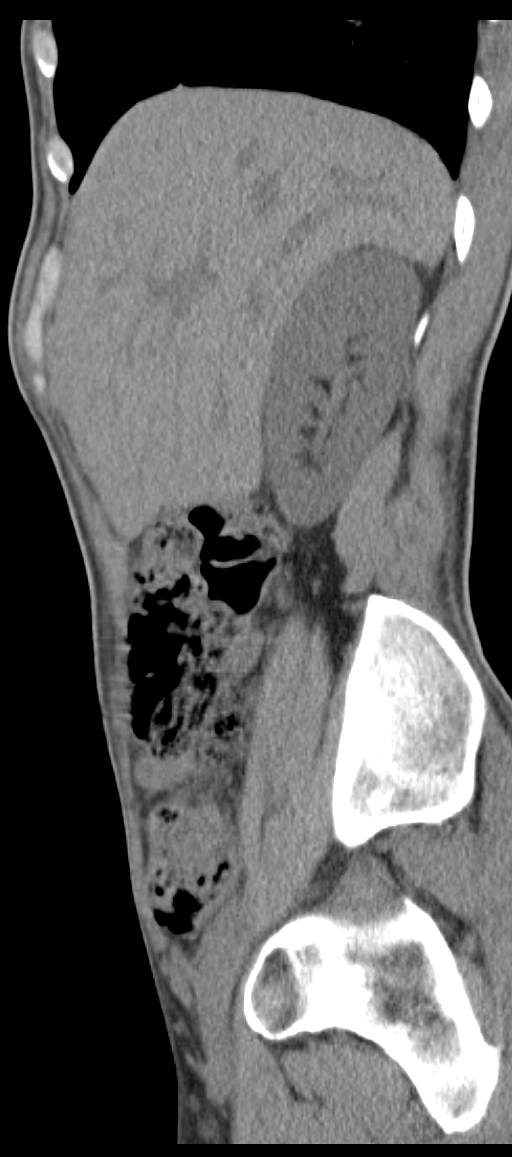
[im 33/99  bone]
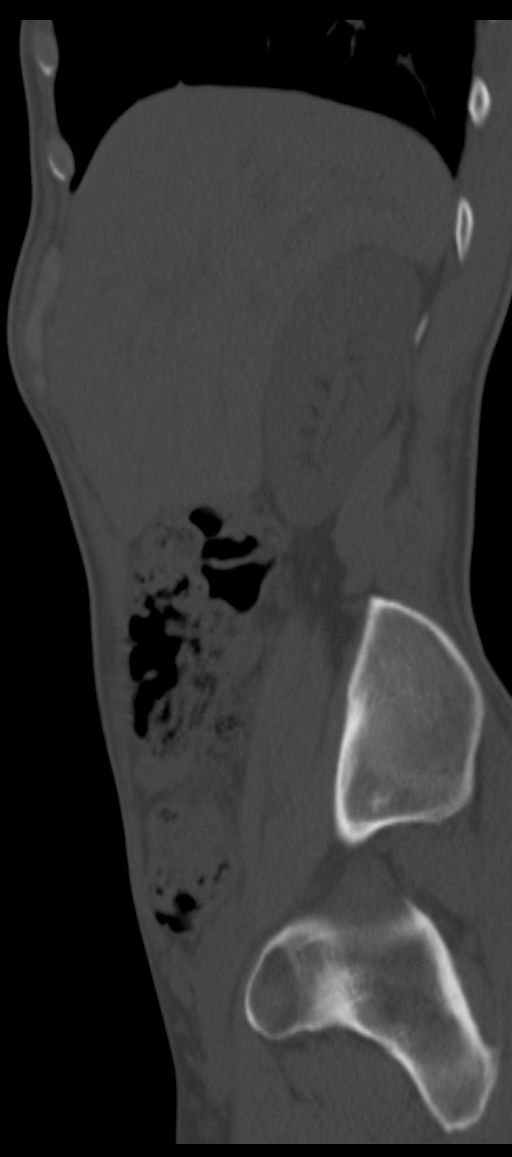

[Series 6: lung 5.0 b60f · axial · 0.66mm/px · z∈[-192,-122]mm · 5 of 22 slices shown, 10 images]
[im 4/22  soft-tissue]
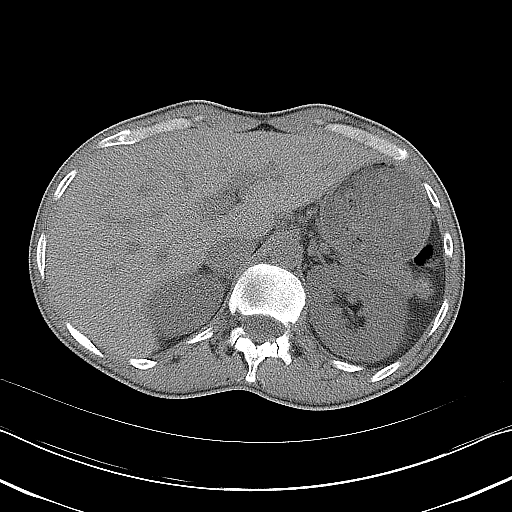
[im 4/22  bone]
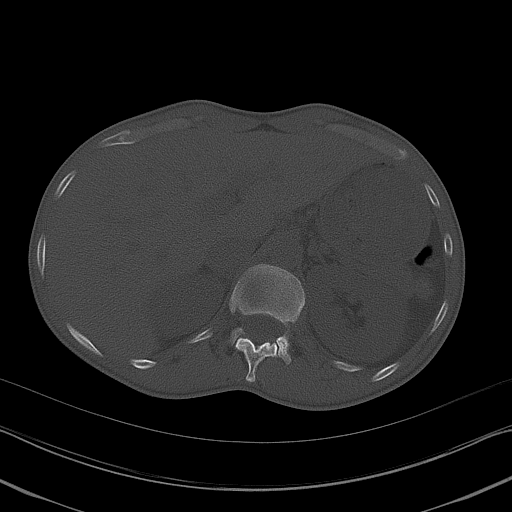
[im 8/22  soft-tissue]
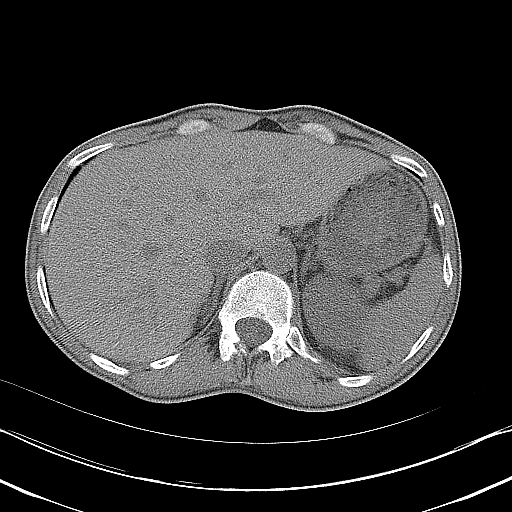
[im 8/22  lung]
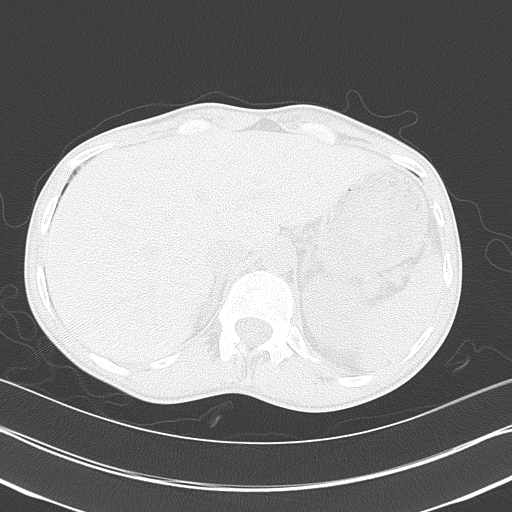
[im 11/22  soft-tissue]
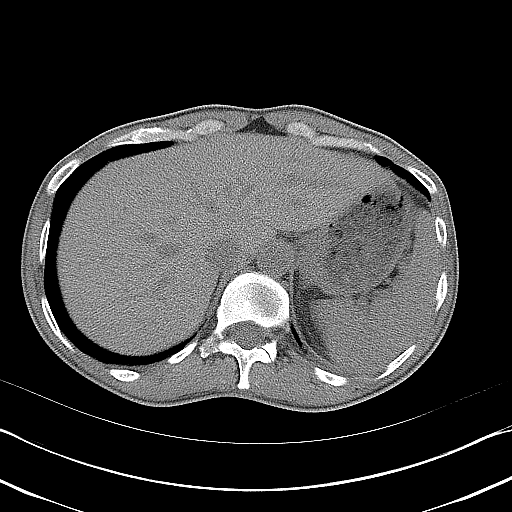
[im 11/22  lung]
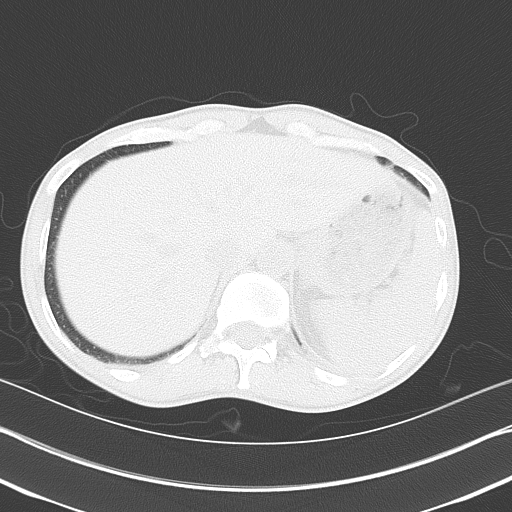
[im 15/22  soft-tissue]
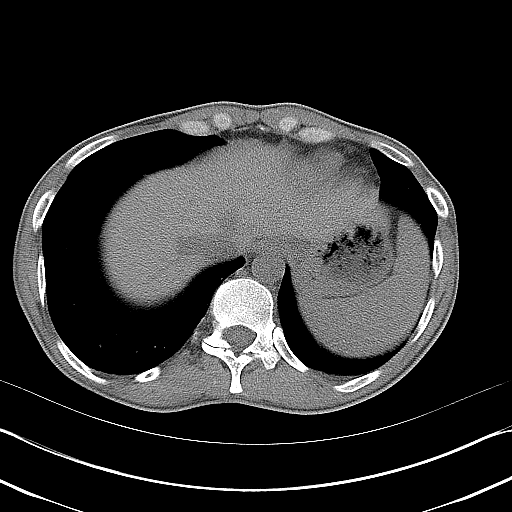
[im 15/22  lung]
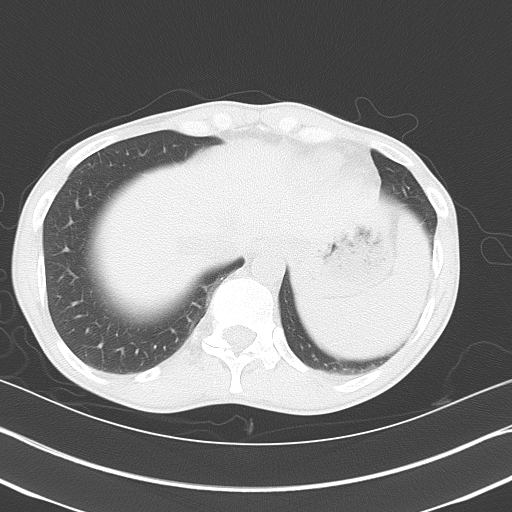
[im 18/22  soft-tissue]
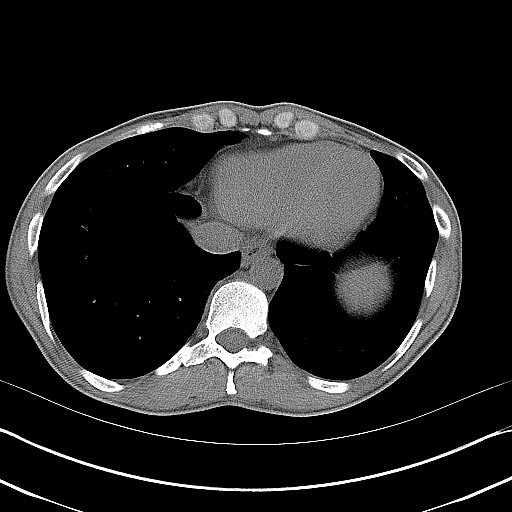
[im 18/22  lung]
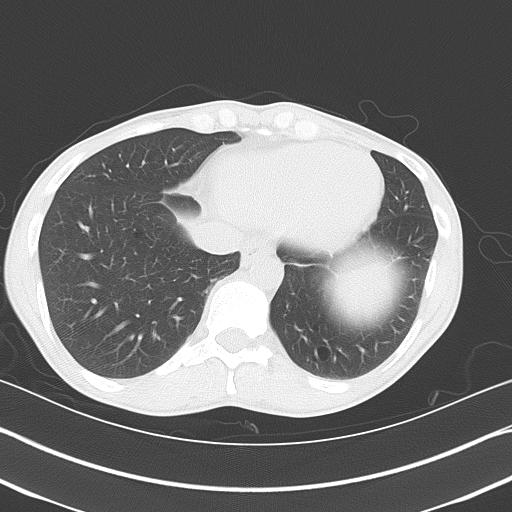

[9 of 46 positions shown; findings below may reference images not displayed]

FINDINGS: Visualized lung bases are clear.

The liver demonstrates a normal unenhanced appearance. Gallbladder
largely decompressed. No biliary dilatation. Spleen, adrenal glands,
and pancreas within normal limits.

Kidneys are equal in size without evidence of nephrolithiasis or
hydronephrosis. No radiopaque calculi seen along the course of
either renal collecting system. No hydroureter.

Stomach within normal limits. No evidence for bowel obstruction.
Appendix visualized in the right lower quadrant and is of normal
caliber and appearance without evidence for acute appendicitis. No
acute inflammatory changes seen about the bowels.

Bladder within normal limits.  Prostate normal.

No free air or fluid. No pathologically enlarged intra-abdominal or
pelvic lymph nodes identified.

Scoliosis noted.  No acute osseous abnormality.
IMPRESSION: 1. No CT evidence for nephrolithiasis or obstructive uropathy.
2. No other acute intra-abdominal or pelvic process identified.
3. Normal appendix.

## 2015-10-24 ENCOUNTER — Encounter: Payer: Self-pay | Admitting: Emergency Medicine

## 2015-10-24 ENCOUNTER — Emergency Department
Admission: EM | Admit: 2015-10-24 | Discharge: 2015-10-24 | Disposition: A | Discharge status: Home / Self Care | Payer: No Typology Code available for payment source | Attending: Emergency Medicine | Admitting: Emergency Medicine

## 2015-10-24 DIAGNOSIS — Y999 Unspecified external cause status: Secondary | ICD-10-CM | POA: Insufficient documentation

## 2015-10-24 DIAGNOSIS — F411 Generalized anxiety disorder: Secondary | ICD-10-CM | POA: Insufficient documentation

## 2015-10-24 DIAGNOSIS — M62830 Muscle spasm of back: Secondary | ICD-10-CM | POA: Diagnosis not present

## 2015-10-24 DIAGNOSIS — Y92410 Unspecified street and highway as the place of occurrence of the external cause: Secondary | ICD-10-CM | POA: Diagnosis not present

## 2015-10-24 DIAGNOSIS — F17210 Nicotine dependence, cigarettes, uncomplicated: Secondary | ICD-10-CM | POA: Diagnosis not present

## 2015-10-24 DIAGNOSIS — M545 Low back pain: Secondary | ICD-10-CM | POA: Diagnosis present

## 2015-10-24 DIAGNOSIS — Y939 Activity, unspecified: Secondary | ICD-10-CM | POA: Insufficient documentation

## 2015-10-24 DIAGNOSIS — F329 Major depressive disorder, single episode, unspecified: Secondary | ICD-10-CM | POA: Diagnosis not present

## 2015-10-24 DIAGNOSIS — M62838 Other muscle spasm: Secondary | ICD-10-CM | POA: Insufficient documentation

## 2015-10-24 MED ORDER — PANTOPRAZOLE SODIUM 20 MG PO TBEC
20.0000 mg | DELAYED_RELEASE_TABLET | Freq: Every day | ORAL | Status: DC
Start: 2015-10-24 — End: 2016-11-03

## 2015-10-24 MED ORDER — DIAZEPAM 2 MG PO TABS
2.0000 mg | ORAL_TABLET | Freq: Three times a day (TID) | ORAL | Status: DC | PRN
Start: 2015-10-24 — End: 2016-11-03

## 2015-10-24 MED ORDER — MELOXICAM 15 MG PO TABS: 15.0000 mg | ORAL_TABLET | Freq: Every day | ORAL | Status: DC
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
## 2016-11-02 ENCOUNTER — Emergency Department (HOSPITAL_COMMUNITY)
Admission: EM | Admit: 2016-11-02 | Discharge: 2016-11-02 | Disposition: A | Discharge status: Home / Self Care | Payer: Self-pay | Attending: Dermatology | Admitting: Dermatology

## 2016-11-02 ENCOUNTER — Encounter (HOSPITAL_COMMUNITY): Payer: Self-pay | Admitting: Emergency Medicine

## 2016-11-02 DIAGNOSIS — R319 Hematuria, unspecified: Secondary | ICD-10-CM | POA: Insufficient documentation

## 2016-11-02 DIAGNOSIS — Z5321 Procedure and treatment not carried out due to patient leaving prior to being seen by health care provider: Secondary | ICD-10-CM | POA: Insufficient documentation

## 2016-11-02 DIAGNOSIS — F17210 Nicotine dependence, cigarettes, uncomplicated: Secondary | ICD-10-CM | POA: Insufficient documentation

## 2016-11-02 NOTE — ED Notes (Signed)
Pt called several times, no answer. Not outside. Several people in lobby stated that he left.

## 2016-11-02 NOTE — ED Triage Notes (Signed)
Pt c/o hematuria since yesterday with lower abd pain. Pt states he has lost 10lbs in 2 weeks.

## 2016-11-03 ENCOUNTER — Emergency Department (HOSPITAL_COMMUNITY)
Admission: EM | Admit: 2016-11-03 | Discharge: 2016-11-03 | Disposition: A | Discharge status: Home / Self Care | Payer: Self-pay | Attending: Emergency Medicine | Admitting: Emergency Medicine

## 2016-11-03 ENCOUNTER — Emergency Department (HOSPITAL_COMMUNITY): Payer: Self-pay

## 2016-11-03 ENCOUNTER — Encounter (HOSPITAL_COMMUNITY): Payer: Self-pay | Admitting: Emergency Medicine

## 2016-11-03 DIAGNOSIS — Z79899 Other long term (current) drug therapy: Secondary | ICD-10-CM | POA: Insufficient documentation

## 2016-11-03 DIAGNOSIS — R059 Cough, unspecified: Secondary | ICD-10-CM

## 2016-11-03 DIAGNOSIS — R05 Cough: Secondary | ICD-10-CM | POA: Insufficient documentation

## 2016-11-03 DIAGNOSIS — R1030 Lower abdominal pain, unspecified: Secondary | ICD-10-CM | POA: Insufficient documentation

## 2016-11-03 DIAGNOSIS — F419 Anxiety disorder, unspecified: Secondary | ICD-10-CM | POA: Insufficient documentation

## 2016-11-03 DIAGNOSIS — R310 Gross hematuria: Secondary | ICD-10-CM | POA: Insufficient documentation

## 2016-11-03 DIAGNOSIS — F17210 Nicotine dependence, cigarettes, uncomplicated: Secondary | ICD-10-CM | POA: Insufficient documentation

## 2016-11-03 HISTORY — DX: Gastric ulcer, unspecified as acute or chronic, without hemorrhage or perforation: K25.9

## 2016-11-03 LAB — COMPREHENSIVE METABOLIC PANEL
ALT: 67 U/L — ABNORMAL HIGH (ref 17–63)
AST: 46 U/L — ABNORMAL HIGH (ref 15–41)
Albumin: 4.1 g/dL (ref 3.5–5.0)
Alkaline Phosphatase: 49 U/L (ref 38–126)
Anion gap: 9 (ref 5–15)
BUN: 16 mg/dL (ref 6–20)
CO2: 26 mmol/L (ref 22–32)
Calcium: 9.6 mg/dL (ref 8.9–10.3)
Chloride: 103 mmol/L (ref 101–111)
Creatinine, Ser: 0.59 mg/dL — ABNORMAL LOW (ref 0.61–1.24)
GFR calc Af Amer: >60 mL/min (ref >60)
GFR calc non Af Amer: >60 mL/min (ref >60)
Glucose, Bld: 114 mg/dL — ABNORMAL HIGH (ref 65–99)
Potassium: 3.7 mmol/L (ref 3.5–5.1)
Sodium: 138 mmol/L (ref 135–145)
Total Bilirubin: 0.6 mg/dL (ref 0.3–1.2)
Total Protein: 7.1 g/dL (ref 6.5–8.1)

## 2016-11-03 LAB — URINALYSIS, ROUTINE W REFLEX MICROSCOPIC
Bilirubin Urine: NEGATIVE
Glucose, UA: NEGATIVE mg/dL
Hgb urine dipstick: NEGATIVE
Ketones, ur: NEGATIVE mg/dL
Leukocytes, UA: NEGATIVE
Nitrite: NEGATIVE
Protein, ur: NEGATIVE mg/dL
Specific Gravity, Urine: 1.026 (ref 1.005–1.030)
pH: 5 (ref 5.0–8.0)

## 2016-11-03 LAB — CBC
HEMATOCRIT: 43.1 % (ref 39.0–52.0)
HEMOGLOBIN: 14.5 g/dL (ref 13.0–17.0)
MCH: 32.7 pg (ref 26.0–34.0)
MCHC: 33.6 g/dL (ref 30.0–36.0)
MCV: 97.1 fL (ref 78.0–100.0)
Platelets: 204 10*3/uL (ref 150–400)
RBC: 4.44 MIL/uL (ref 4.22–5.81)
RDW: 12.6 % (ref 11.5–15.5)
WBC: 6.6 10*3/uL (ref 4.0–10.5)

## 2016-11-03 LAB — LIPASE, BLOOD: Lipase: 21 U/L (ref 11–51)

## 2016-11-03 MED ORDER — PREDNISONE 10 MG PO TABS: ORAL_TABLET | ORAL | 0 refills | Status: DC

## 2016-11-03 MED ORDER — ALBUTEROL SULFATE HFA 108 (90 BASE) MCG/ACT IN AERS
2.0000 | INHALATION_SPRAY | Freq: Once | RESPIRATORY_TRACT | Status: AC
  Administered 2016-11-03: 2 via RESPIRATORY_TRACT
  Filled 2016-11-03: qty 6.7

## 2016-11-03 MED ORDER — LORAZEPAM 1 MG PO TABS
1.0000 mg | ORAL_TABLET | Freq: Once | ORAL | Status: AC
  Administered 2016-11-03: 1 mg via ORAL
  Filled 2016-11-03: qty 1

## 2016-11-03 MED ORDER — LORAZEPAM 1 MG PO TABS: 1.0000 mg | ORAL_TABLET | Freq: Three times a day (TID) | ORAL | 0 refills | Status: DC | PRN

## 2016-11-03 NOTE — Discharge Instructions (Signed)
2 puffs of the albuterol 4 times a day as needed. Call the urologist listed to arrange a follow-up appt regarding the presence of blood in your urine.  You will need to follow-up with your primary provider for further management of your anxiety

## 2016-11-03 NOTE — ED Notes (Signed)
Pt made aware to return if symptoms worsen or if any life threatening symptoms occur.   

## 2016-11-03 NOTE — ED Provider Notes (Signed)
[facility]-EMERGENCY DEPT Provider Note   CSN: [id number] Arrival date & time: 11/03/16  0753     History   Chief Complaint Chief Complaint  Patient presents with  . Abdominal Pain    HPI Andre Lynch is a 36 y.o. male.  HPI   Andre Lynch is a 36 y.o. male who presents to the Emergency Department with multiple complaints.  He reports having intermittent diffuse abdominal pain for 2 weeks.  Described as cramping pain and bloody urine on two occasions.  He also notes having episodes of feeling like he is unable to completely empty his bladder.  Abdominal pain is not affected by food intake.  Denies vomiting, diarrhea, fever, flank pain, penile discharge or burning with urination.  He also complains of chronic anxiety and states that his primary doctor has been trying different medications which he states are not helping.  He denies SI or Hi thoughts. Lastly, pt complains of productive cough for several days.  Denies shortness of breath and chest pain.     Past Medical History:  Diagnosis Date  . Anxiety   . Back pain   . Chronic knee pain   . Depression   . Gastric ulcer   . History of narcotic addiction (HCC)    methadone tx  . Kidney stones   . RLS (restless legs syndrome)   . Seizures (HCC)   . Substance abuse    cocaine, benzos, marijuana, benzos, opiates    Patient Active Problem List   Diagnosis Date Noted  . H/O: substance abuse 07/28/2012  . GAD (generalized anxiety disorder) 07/28/2012    Past Surgical History:  Procedure Laterality Date  . HERNIA REPAIR    . WISDOM TOOTH EXTRACTION         Home Medications    Prior to Admission medications   Medication Sig Start Date End Date Taking? Authorizing Provider  ALPRAZolam Prudy Feeler) 1 MG tablet Take 1 tablet (1 mg total) by mouth 2 (two) times daily as needed for anxiety. 12/24/14  Yes Bethann Berkshire, MD  buprenorphine (SUBUTEX) 8 MG SUBL SL tablet DISSOLVE 1 TABLET UNDER TONGUE 2 TIMES A DAY. 10/26/16  Yes  [provider]    Family History Family History  Problem Relation Age of Onset  . Cancer Mother        breast cancer survivor    Social History Social History  Substance Use Topics  . Smoking status: Current Every Day Smoker    Packs/day: 0.50    Years: 20.00    Types: Cigarettes  . Smokeless tobacco: Never Used  . Alcohol use No     Allergies   Ibuprofen and Tylenol [acetaminophen]   Review of Systems Review of Systems  Constitutional: Negative for appetite change, chills and fever.  Respiratory: Positive for cough. Negative for chest tightness and shortness of breath.   Cardiovascular: Negative for chest pain.  Gastrointestinal: Positive for abdominal pain. Negative for blood in stool, diarrhea, nausea and vomiting.  Genitourinary: Positive for hematuria. Negative for decreased urine volume, difficulty urinating, dysuria and flank pain.  Musculoskeletal: Negative for back pain and myalgias.  Skin: Negative for color change and rash.  Neurological: Negative for dizziness, weakness, numbness and headaches.  Hematological: Negative for adenopathy.  Psychiatric/Behavioral: Negative for suicidal ideas. The patient is nervous/anxious.   All other systems reviewed and are negative.    Physical Exam Updated Vital Signs BP 114/70 (BP Location: Right Arm)   Pulse 73   Temp 97.5 F (  36.4 C) (Oral)   Resp 16   Ht 5\' 8"  (1.727 m)   Wt 61.2 kg (135 lb)   SpO2 96%   BMI 20.53 kg/m   Physical Exam  Constitutional: He is oriented to person, place, and time. He appears well-developed and well-nourished. No distress.  HENT:  Head: Atraumatic.  Mouth/Throat: Oropharynx is clear and moist.  Neck: Normal range of motion. Neck supple.  Cardiovascular: Normal rate and intact distal pulses.   Pulmonary/Chest: Effort normal and breath sounds normal. No respiratory distress. He has no wheezes. He has no rales.  Abdominal: Soft. He exhibits no distension and no mass.  There is no tenderness. There is no rebound and no guarding.  Musculoskeletal: Normal range of motion.  Neurological: He is alert and oriented to person, place, and time. No sensory deficit.  Skin: Skin is warm. No rash noted.  Psychiatric: He has a normal mood and affect.  Nursing note and vitals reviewed.    ED Treatments / Results  Labs (all labs ordered are listed, but only abnormal results are displayed) Labs Reviewed  COMPREHENSIVE METABOLIC PANEL - Abnormal; Notable for the following:       Result Value   Glucose, Bld 114 (*)    Creatinine, Ser 0.59 (*)    AST 46 (*)    ALT 67 (*)    All other components within normal limits  URINALYSIS, ROUTINE W REFLEX MICROSCOPIC - Abnormal; Notable for the following:    Color, Urine AMBER (*)    APPearance CLOUDY (*)    All other components within normal limits  CBC  LIPASE, BLOOD    EKG  EKG Interpretation None       Radiology Dg Chest 2 View  Result Date: 11/03/2016 CLINICAL DATA:  Intermittent abdominal pain for 2 weeks EXAM: CHEST  2 VIEW COMPARISON:  08/03/2013 FINDINGS: There is no focal parenchymal opacity. There is no pleural effusion or pneumothorax. The heart and mediastinal contours are unremarkable. There is a dextroscoliosis of the thoracic spine. IMPRESSION: No active cardiopulmonary disease. Electronically Signed   By: Elige KoHetal  Patel   On: 11/03/2016 09:18    Procedures Procedures (including critical care time)  Medications Ordered in ED Medications  LORazepam (ATIVAN) tablet 1 mg (1 mg Oral Given 11/03/16 [id number])     Initial Impression / Assessment and Plan / ED Course  I have reviewed the triage vital signs and the nursing notes.  Pertinent labs & imaging results that were available during my care of the patient were reviewed by me and considered in my medical decision making (see chart for details).     1010  Pt is feeling better, vitals stable.  Abd remains soft, NT. Doubt renal stone or acute abdomen.   Labs and urine are reassuring.   Dispensed albuterol MDI, rx for steroid.  Pt agrees to referral to urology and PCP f/u   Final Clinical Impressions(s) / ED Diagnoses   Final diagnoses:  Cough in adult  Lower abdominal pain  Gross hematuria    New Prescriptions New Prescriptions   No medications on file     Rosey Bathriplett, Yanique Mulvihill, PA-C 11/07/16 2012    Bethann BerkshireZammit, Joseph, MD 11/09/16 1623

## 2016-11-03 NOTE — ED Triage Notes (Signed)
Pt reports intermittent abd pain x2 weeks. Pt reports hematuria x2. Pt denies any blood this am with voiding. Pt reports increased frequency and "not emptying bladder completely". nad noted.

## 2016-11-03 NOTE — ED Notes (Signed)
Pt waiting on mother to pick him up. Pt verbalized understanding that he should not be driving with ativan.

## 2016-11-04 ENCOUNTER — Encounter (HOSPITAL_COMMUNITY): Payer: Self-pay

## 2016-11-04 ENCOUNTER — Emergency Department (HOSPITAL_COMMUNITY): Payer: Self-pay

## 2016-11-04 ENCOUNTER — Emergency Department (HOSPITAL_COMMUNITY)
Admission: EM | Admit: 2016-11-04 | Discharge: 2016-11-04 | Disposition: A | Discharge status: Home / Self Care | Payer: Self-pay | Attending: Emergency Medicine | Admitting: Emergency Medicine

## 2016-11-04 DIAGNOSIS — K5900 Constipation, unspecified: Secondary | ICD-10-CM | POA: Insufficient documentation

## 2016-11-04 DIAGNOSIS — Z79899 Other long term (current) drug therapy: Secondary | ICD-10-CM | POA: Insufficient documentation

## 2016-11-04 DIAGNOSIS — R300 Dysuria: Secondary | ICD-10-CM | POA: Insufficient documentation

## 2016-11-04 DIAGNOSIS — F17210 Nicotine dependence, cigarettes, uncomplicated: Secondary | ICD-10-CM | POA: Insufficient documentation

## 2016-11-04 DIAGNOSIS — R319 Hematuria, unspecified: Secondary | ICD-10-CM | POA: Insufficient documentation

## 2016-11-04 DIAGNOSIS — R109 Unspecified abdominal pain: Secondary | ICD-10-CM

## 2016-11-04 LAB — URINALYSIS, ROUTINE W REFLEX MICROSCOPIC
Bilirubin Urine: NEGATIVE
Glucose, UA: NEGATIVE mg/dL
HGB URINE DIPSTICK: NEGATIVE
Ketones, ur: NEGATIVE mg/dL
Leukocytes, UA: NEGATIVE
Nitrite: NEGATIVE
Protein, ur: NEGATIVE mg/dL
SPECIFIC GRAVITY, URINE: 1.02 (ref 1.005–1.030)
pH: 7 (ref 5.0–8.0)

## 2016-11-04 MED ORDER — POLYETHYLENE GLYCOL 3350 17 G PO PACK: 17.0000 g | PACK | Freq: Every day | ORAL | 0 refills | Status: DC

## 2016-11-04 NOTE — Discharge Instructions (Signed)
There is no urine infection or blood in your urine today. You may have passed a kidney stone. Take the medication for constipation as prescribed and followup with your doctor. Return to the ED if you develop new or worsening symptoms.

## 2016-11-04 NOTE — ED Triage Notes (Signed)
Pt states he continues to have abd pain and blood in his urine

## 2016-11-04 NOTE — ED Provider Notes (Signed)
[facility]-EMERGENCY DEPT Provider Note   CSN: [id number] Arrival date & time: 11/04/16  0244     History   Chief Complaint Chief Complaint  Patient presents with  . Abdominal Pain    HPI Andre Lynch is a 36 y.o. male.  Patient returns with intermittent lower abdominal pain and what he believes to be blood in his urine. Seen yesterday for same. Reports history of kidney stones. States he's had intermittent left lower quadrant pain coming and going for the past several days. Is associated with hematuria that he sees at the end of his urination. States he urinates normally with normal amounts but then sees some blood at the end of his stream. This happened 3 times. UA yesterday was negative for blood. Denies any fever. No chest pain or shortness of breath. Denies any vomiting. Patient is on Suboxone for history of narcotic abuse. Denies any change in bowel habits. Denies any testicular pain.   The history is provided by the patient.  Abdominal Pain   Associated symptoms include dysuria and hematuria. Pertinent negatives include fever, diarrhea, nausea, vomiting, headaches, arthralgias and myalgias.    Past Medical History:  Diagnosis Date  . Anxiety   . Back pain   . Chronic knee pain   . Depression   . Gastric ulcer   . History of narcotic addiction (HCC)    methadone tx  . Kidney stones   . RLS (restless legs syndrome)   . Seizures (HCC)   . Substance abuse    cocaine, benzos, marijuana, benzos, opiates    Patient Active Problem List   Diagnosis Date Noted  . H/O: substance abuse 07/28/2012  . GAD (generalized anxiety disorder) 07/28/2012    Past Surgical History:  Procedure Laterality Date  . HERNIA REPAIR    . WISDOM TOOTH EXTRACTION         Home Medications    Prior to Admission medications   Medication Sig Start Date End Date Taking? Authorizing Provider  ALPRAZolam Prudy Feeler) 1 MG tablet Take 1 tablet (1 mg total) by mouth 2 (two) times daily as needed for  anxiety. 12/24/14  Yes Bethann Berkshire, MD  buprenorphine (SUBUTEX) 8 MG SUBL SL tablet DISSOLVE 1 TABLET UNDER TONGUE 2 TIMES A DAY. 10/26/16  Yes [provider]  LORazepam (ATIVAN) 1 MG tablet Take 1 tablet (1 mg total) by mouth 3 (three) times daily as needed for anxiety. 11/03/16  Yes Triplett, Tammy, PA-C  predniSONE (DELTASONE) 10 MG tablet Take 6 tablets day one, 5 tablets day two, 4 tablets day three, 3 tablets day four, 2 tablets day five, then 1 tablet day six 11/03/16  Yes Triplett, Tammy, PA-C    Family History Family History  Problem Relation Age of Onset  . Cancer Mother        breast cancer survivor    Social History Social History  Substance Use Topics  . Smoking status: Current Every Day Smoker    Packs/day: 0.50    Years: 20.00    Types: Cigarettes  . Smokeless tobacco: Never Used  . Alcohol use No     Allergies   Ibuprofen and Tylenol [acetaminophen]   Review of Systems Review of Systems  Constitutional: Negative for activity change, appetite change and fever.  HENT: Negative for congestion and rhinorrhea.   Respiratory: Negative for cough, chest tightness and shortness of breath.   Gastrointestinal: Positive for abdominal pain. Negative for diarrhea, nausea and vomiting.  Genitourinary: Positive for dysuria and hematuria. Negative  for testicular pain.  Musculoskeletal: Negative for arthralgias, back pain and myalgias.  Skin: Negative for rash.  Neurological: Negative for dizziness, weakness and headaches.    all other systems are negative except as noted in the HPI and PMH.    Physical Exam Updated Vital Signs BP (!) 128/94 (BP Location: Right Arm)   Pulse 91   Temp 98.3 F (36.8 C) (Oral)   Resp 16   Ht 5\' 8"  (1.727 m)   Wt 61.2 kg (135 lb)   SpO2 98%   BMI 20.53 kg/m   Physical Exam  Constitutional: He is oriented to person, place, and time. He appears well-developed and well-nourished. No distress.  HENT:  Head: Normocephalic and  atraumatic.  Mouth/Throat: Oropharynx is clear and moist. No oropharyngeal exudate.  Eyes: Conjunctivae and EOM are normal. Pupils are equal, round, and reactive to light.  Neck: Normal range of motion. Neck supple.  No meningismus.  Cardiovascular: Normal rate, regular rhythm, normal heart sounds and intact distal pulses.   No murmur heard. Pulmonary/Chest: Effort normal and breath sounds normal. No respiratory distress.  Abdominal: Soft. There is tenderness. There is no rebound and no guarding.  Mild LLQ tenderness  Genitourinary:  Genitourinary Comments: No testicular tenderness  Musculoskeletal: Normal range of motion. He exhibits no edema or tenderness.  No CVAT  Neurological: He is alert and oriented to person, place, and time. No cranial nerve deficit. He exhibits normal muscle tone. Coordination normal.  No ataxia on finger to nose bilaterally. No pronator drift. 5/5 strength throughout. CN 2-12 intact.Equal grip strength. Sensation intact.   Skin: Skin is warm.  Psychiatric: He has a normal mood and affect. His behavior is normal.  Nursing note and vitals reviewed.    ED Treatments / Results  Labs (all labs ordered are listed, but only abnormal results are displayed) Labs Reviewed  URINE CULTURE  URINALYSIS, ROUTINE W REFLEX MICROSCOPIC    EKG  EKG Interpretation None       Radiology Dg Chest 2 View  Result Date: 11/03/2016 CLINICAL DATA:  Intermittent abdominal pain for 2 weeks EXAM: CHEST  2 VIEW COMPARISON:  08/03/2013 FINDINGS: There is no focal parenchymal opacity. There is no pleural effusion or pneumothorax. The heart and mediastinal contours are unremarkable. There is a dextroscoliosis of the thoracic spine. IMPRESSION: No active cardiopulmonary disease. Electronically Signed   By: Elige KoHetal  Patel   On: 11/03/2016 09:18   Ct Renal Stone Study  Result Date: 11/04/2016 CLINICAL DATA:  Flank pain. Intermittent abdominal pain for 2 weeks. EXAM: CT ABDOMEN AND  PELVIS WITHOUT CONTRAST TECHNIQUE: Multidetector CT imaging of the abdomen and pelvis was performed following the standard protocol without IV contrast. COMPARISON:  CT 11/01/2014 FINDINGS: Lower chest: No acute abnormality.  Small pulmonary cysts. Hepatobiliary: No focal liver abnormality is seen. No gallstones, gallbladder wall thickening, or biliary dilatation. Pancreas: No ductal dilatation or inflammation. Spleen: Normal in size without focal abnormality. Adrenals/Urinary Tract: Adrenal glands are unremarkable. No hydronephrosis or perinephric edema. Possible punctate nonobstructing stone in the mid right kidney. Ureters are decompressed without stones along the course. Bladder is unremarkable. Stomach/Bowel: Moderate colonic stool burden without colonic wall thickening. No bowel obstruction. There is colonic tortuosity. Appendix tentatively identified and normal, no pericecal or right lower quadrant inflammation. Stomach distended with ingested contents. Vascular/Lymphatic: Mild aortic atherosclerosis, no aneurysm. No evidence of adenopathy allowing for lack of contrast and paucity of intra-abdominal fat. Reproductive: Prostate is unremarkable. Other: No free air, free fluid, or intra-abdominal  fluid collection. Musculoskeletal: Levo scoliosis of the lumbar spine. IMPRESSION: 1. No hydronephrosis or obstructive uropathy. Possible punctate nonobstructing stone in the mid right kidney. 2. No acute abnormality in the abdomen/pelvis. 3. Increased colonic stool burden with colonic tortuosity suggesting constipation. 4. Mild aortic atherosclerosis. Electronically Signed   By: Rubye Oaks M.D.   On: 11/04/2016 04:29    Procedures Procedures (including critical care time)  Medications Ordered in ED Medications - No data to display   Initial Impression / Assessment and Plan / ED Course  I have reviewed the triage vital signs and the nursing notes.  Pertinent labs & imaging results that were available  during my care of the patient were reviewed by me and considered in my medical decision making (see chart for details).     Intermittent lower abdominal pain with questionable hematuria. Well-appearing. No testicular pain. Labs reviewed and were normal earlier this morning.  Urinalysis is negative. No blood noted.  Patient with intermittent left lower quadrant abdominal pain and flank pain. CT scan obtained shows no ureteral stones or hydronephrosis.  Considered possible passed kidney stone. Constipation may also be contributing. Follow-up with PCP. Return precautions discussed. Final Clinical Impressions(s) / ED Diagnoses   Final diagnoses:  Flank pain    New Prescriptions New Prescriptions   No medications on file     Glynn Octave, MD 11/04/16 [phone]

## 2016-11-05 LAB — URINE CULTURE: CULTURE: NO GROWTH

## 2016-11-06 ENCOUNTER — Encounter (HOSPITAL_COMMUNITY): Payer: Self-pay | Admitting: *Deleted

## 2016-11-06 ENCOUNTER — Emergency Department (HOSPITAL_COMMUNITY)
Admission: EM | Admit: 2016-11-06 | Discharge: 2016-11-06 | Disposition: A | Discharge status: Home / Self Care | Payer: Self-pay | Attending: Emergency Medicine | Admitting: Emergency Medicine

## 2016-11-06 DIAGNOSIS — F419 Anxiety disorder, unspecified: Secondary | ICD-10-CM | POA: Insufficient documentation

## 2016-11-06 DIAGNOSIS — K5900 Constipation, unspecified: Secondary | ICD-10-CM | POA: Insufficient documentation

## 2016-11-06 DIAGNOSIS — F17210 Nicotine dependence, cigarettes, uncomplicated: Secondary | ICD-10-CM | POA: Insufficient documentation

## 2016-11-06 NOTE — Discharge Instructions (Signed)
Take MiraLAX twice daily until you're having regular soft bowel movements  See your doctor for referral to a mental health professional, otherwise see the attached list of local mental health professionalsSubstance Abuse Treatment Programs  Intensive Outpatient Programs [facility]     [address]      [place], [place]                   [phone]       [facility] [address] [place], [place] [phone]  [facility]     (Inpatient and outpatient)     [address]           [phone]    [facility] [phone] (Suboxone and Methadone)  [address]      [place], [place] [address]      [phone]       [address] [place], [place] [phone]  [facility] (Outpatient/Inpatient, [employer])    (insurance only) [phone]             [facility] (Groups & Residential) [place], [place] [phone]     [facility]     [address]     [place], [place]      [phone]       [facility] (for caregivers and family) [phone] [place], [place] [phone]      Residential Treatment Programs [facility]      [address], [place], [place] [address]  [phone]       [facility] [address] [place], [place] [address] [phone]  [facility]        [phone]       [facility] [phone]  [facility]             [address]                                         [place], [place]                                                [phone] or [phone]                               [facility] [place] [address] [place], [address] [phone]  [facility]    [address]      [place], [place]     [phone]       [facility] [address] [place], [place] [phone]  [facility]   [address]     [place], [place] [address]     [phone]      Admissions: 8am-3pm M-F  [facility] ([facility]) [address] [place], [place] [phone]  [facility]: Residential Program [phone](90 Days)   [place], [place]      [phone] or [phone]     [facility]: [facility] [place], [place] (Walk in Hours over the weekend or by referral)  [facility] [address] [place], [place], [place] [address] [phone]  Crisis Mobile: [facility]:  [phone] (for crisis response 24 hours a day) [facility] Hotline:      [phone] Outpatient Psychiatry and Counseling  [facility]: Mobile Crisis Management 24 hours:  [phone]  [facility] [employer] sliding scale fee and walk in schedule: M-F 8am-12pm/1pm-3pm [address]  [place], [place] [address] [phone]  [facility] [address] [place], [place] [address] [phone]  [facility] (Formerly known as [facility] [employer])- new patient walk-in appointments available Monday - Friday 8am -3pm.          [address] [place], [place] [address] [phone] or crisis line- [phone]  [facility] Services/ Intensive Outpatient Therapy Program [address] [place], [place] [address] [phone]  [facility]                  Crisis Services      [phone]     [place], [place] [address]                 [facility]   [facility] [phone]. [address] [place], [place] [address]   [employer] of Care          [address] Laura  [place], [place] [address]       [phone]  [facility] [address] [place], [place] [address] [phone]  [facility]    [address]    [place], [place] [address]     [phone]       Andee Poles, MD     [address]     [place] [address]     [phone]       [facility] [address] [place] [address]  [facility]     [address] [place], [place]      [phone]       [facility] Eulogio Ditch, MD [address] [place], [place] [address] [phone]  [facility]     [address]     [place], [place] [address]     [phone]       [facility] [address] [place], [place] [address] [phone] Resources for Temporary Residential Assistance/Crisis Centers  [facility]) M-F 8am-3pm   [address] [place], [place] [address]   [phone] Services include: laundry, barbering, support groups, case management, phone  & computer access, showers, AA/NA mtgs, mental health/substance abuse nurse, job skills class, disability information, VA assistance, spiritual classes, etc.   HOMELESS SHELTERS  [facility]     [facility]   [address], [place]     [phone]              [employer] (women and children)       [address] [place], [place] [address] [phone] [email or web address] for application and process Application Required  [facility] [employer] [facility]   [address]    [place] [address]     [phone]                    [facility] [address] [place], [place] [address] [phone] application appt.)  Application Required  [employer] (women only)    [place], [place] [address]     [phone]      Intake starts 6pm daily Need valid ID, SSC, & Police report [employer] [address] [place], [place] [phone] Application Required  [employer] (men only)      [place]      [place], [place]       [facility] (Pregnant women only) [address] [place], [place]  [facility]      [address]      [place], [place] [address]     [phone]             [facility] [address] [place], [place] 90 day commitment/SA/Application process  [facility](men only)     [address]     [place], [place]       Check-in at [facility] [address] [place], [place] [address] [phone] Men/Women/Women and Children must be there by 7 pm  [place], [place]

## 2016-11-06 NOTE — ED Triage Notes (Signed)
Pt comes in with hematuria and dysuria starting on 6/5. Pt was seen here. Pt unsure of what dx he was given at discharge. He has lower abdominal pain as well.

## 2016-11-06 NOTE — ED Provider Notes (Signed)
[facility]-EMERGENCY DEPT Provider Note   CSN: [id number] Arrival date & time: 11/06/16  [id number]     History   Chief Complaint Chief Complaint  Patient presents with  . Hematuria    HPI Andre BerlinJason M Lynch is a 36 y.o. male.  HPI  The patient is a 36 year old male, he has a history of anxiety, kidney stones, history of narcotic addiction and is currently on Suboxone therapy, he also has a history of abusing multiple medications including cocaine and benzodiazepines marijuana and opiates. This is his fourth visit in 5 days for similar complaints of feeling like he is having hematuria and constipation. He states that when he urinated this morning he had a small amount of blood at the end of urination, he has bilateral flank pain, he has artery undergone CT scanning 2 days ago as well as lab work which showed normal renal function, no hematuria, urinary testing showed no infection and a CT scan showed constipation but no signs of ureterolithiasis.  Past Medical History:  Diagnosis Date  . Anxiety   . Back pain   . Chronic knee pain   . Depression   . Gastric ulcer   . History of narcotic addiction (HCC)    methadone tx  . Kidney stones   . RLS (restless legs syndrome)   . Seizures (HCC)   . Substance abuse    cocaine, benzos, marijuana, benzos, opiates    Patient Active Problem List   Diagnosis Date Noted  . H/O: substance abuse 07/28/2012  . GAD (generalized anxiety disorder) 07/28/2012    Past Surgical History:  Procedure Laterality Date  . HERNIA REPAIR    . WISDOM TOOTH EXTRACTION         Home Medications    Prior to Admission medications   Medication Sig Start Date End Date Taking? Authorizing Provider  ALPRAZolam Prudy Feeler(XANAX) 1 MG tablet Take 1 tablet (1 mg total) by mouth 2 (two) times daily as needed for anxiety. 12/24/14   Bethann BerkshireZammit, Joseph, MD  buprenorphine (SUBUTEX) 8 MG SUBL SL tablet DISSOLVE 1 TABLET UNDER TONGUE 2 TIMES A DAY. 10/26/16   [provider]    LORazepam (ATIVAN) 1 MG tablet Take 1 tablet (1 mg total) by mouth 3 (three) times daily as needed for anxiety. 11/03/16   Triplett, Tammy, PA-C  polyethylene glycol (MIRALAX) packet Take 17 g by mouth daily. 11/04/16   Rancour, Jeannett SeniorStephen, MD  predniSONE (DELTASONE) 10 MG tablet Take 6 tablets day one, 5 tablets day two, 4 tablets day three, 3 tablets day four, 2 tablets day five, then 1 tablet day six 11/03/16   Pauline Ausriplett, Tammy, PA-C    Family History Family History  Problem Relation Age of Onset  . Cancer Mother        breast cancer survivor    Social History Social History  Substance Use Topics  . Smoking status: Current Every Day Smoker    Packs/day: 0.50    Years: 20.00    Types: Cigarettes  . Smokeless tobacco: Never Used  . Alcohol use No     Allergies   Ibuprofen and Tylenol [acetaminophen]   Review of Systems Review of Systems  All other systems reviewed and are negative.    Physical Exam Updated Vital Signs BP (!) 124/94 (BP Location: Left Arm)   Pulse (!) 101   Temp 97.6 F (36.4 C) (Oral)   Resp 18   Ht 5\' 8"  (1.727 m)   Wt 63.5 kg (140 lb)   SpO2  95%   BMI 21.29 kg/m   Physical Exam  Constitutional: He appears well-developed and well-nourished. No distress.  HENT:  Head: Normocephalic and atraumatic.  Mouth/Throat: Oropharynx is clear and moist. No oropharyngeal exudate.  Eyes: Conjunctivae and EOM are normal. Pupils are equal, round, and reactive to light. Right eye exhibits no discharge. Left eye exhibits no discharge. No scleral icterus.  Neck: Normal range of motion. Neck supple. No JVD present. No thyromegaly present.  Cardiovascular: Normal rate, regular rhythm, normal heart sounds and intact distal pulses.  Exam reveals no gallop and no friction rub.   No murmur heard. Pulmonary/Chest: Effort normal and breath sounds normal. No respiratory distress. He has no wheezes. He has no rales.  Abdominal: Soft. Bowel sounds are normal. He exhibits no  distension and no mass. There is no tenderness.  Musculoskeletal: Normal range of motion. He exhibits no edema or tenderness.  Lymphadenopathy:    He has no cervical adenopathy.  Neurological: He is alert. Coordination normal.  Skin: Skin is warm and dry. No rash noted. No erythema.  Psychiatric:  Anxious appearing  Nursing note and vitals reviewed.    ED Treatments / Results  Labs (all labs ordered are listed, but only abnormal results are displayed) Labs Reviewed  URINALYSIS, ROUTINE W REFLEX MICROSCOPIC     Radiology No results found.  Procedures Procedures (including critical care time)  Medications Ordered in ED Medications - No data to display   Initial Impression / Assessment and Plan / ED Course  I have reviewed the triage vital signs and the nursing notes.  Pertinent labs & imaging results that were available during my care of the patient were reviewed by me and considered in my medical decision making (see chart for details).     The patient is anxious appearing, he has known constipation from prior CT, he otherwise appears well, urinalysis without hematuria, culture already sent 2 days ago, results pending but urinalysis was totally normal. The patient appears stable for discharge, encouraged MiraLAX twice daily as he is currently taking this only every few days  Final Clinical Impressions(s) / ED Diagnoses   Final diagnoses:  Constipation, unspecified constipation type  Anxiety    New Prescriptions New Prescriptions   No medications on file     Eber Hong, MD 11/06/16 [phone]

## 2017-09-06 ENCOUNTER — Emergency Department (HOSPITAL_COMMUNITY)
Admission: EM | Admit: 2017-09-06 | Discharge: 2017-09-06 | Discharge status: Against Medical Advice | Payer: Self-pay | Attending: Emergency Medicine | Admitting: Emergency Medicine

## 2017-09-06 ENCOUNTER — Encounter (HOSPITAL_COMMUNITY): Payer: Self-pay | Admitting: *Deleted

## 2017-09-06 ENCOUNTER — Other Ambulatory Visit: Payer: Self-pay

## 2017-09-06 DIAGNOSIS — Z5321 Procedure and treatment not carried out due to patient leaving prior to being seen by health care provider: Secondary | ICD-10-CM | POA: Insufficient documentation

## 2017-09-06 DIAGNOSIS — R079 Chest pain, unspecified: Secondary | ICD-10-CM | POA: Insufficient documentation

## 2017-09-06 NOTE — ED Triage Notes (Signed)
Pt c/o severe anxiety, chest tightness x couple of weeks. Pt reports hx of anxiety and was once on Xanax and Klonopin but hasn't been on them in about 10 years.

## 2017-09-06 NOTE — ED Notes (Signed)
Pt called to be brought back to ED room x 2, no answer. Pt assumed to have left.  

## 2018-07-27 DIAGNOSIS — F1190 Opioid use, unspecified, uncomplicated: Secondary | ICD-10-CM | POA: Insufficient documentation

## 2018-07-27 DIAGNOSIS — F1120 Opioid dependence, uncomplicated: Secondary | ICD-10-CM | POA: Insufficient documentation

## 2018-07-27 DIAGNOSIS — F17210 Nicotine dependence, cigarettes, uncomplicated: Secondary | ICD-10-CM | POA: Insufficient documentation

## 2018-11-16 ENCOUNTER — Other Ambulatory Visit: Payer: Self-pay

## 2018-11-16 ENCOUNTER — Encounter (HOSPITAL_COMMUNITY): Payer: Self-pay

## 2018-11-16 ENCOUNTER — Emergency Department (HOSPITAL_COMMUNITY)
Admission: EM | Admit: 2018-11-16 | Discharge: 2018-11-16 | Disposition: A | Discharge status: Home / Self Care | Payer: Self-pay | Attending: Emergency Medicine | Admitting: Emergency Medicine

## 2018-11-16 DIAGNOSIS — F17210 Nicotine dependence, cigarettes, uncomplicated: Secondary | ICD-10-CM | POA: Insufficient documentation

## 2018-11-16 DIAGNOSIS — F419 Anxiety disorder, unspecified: Secondary | ICD-10-CM | POA: Insufficient documentation

## 2018-11-16 LAB — CBC WITH DIFFERENTIAL/PLATELET
Abs Immature Granulocytes: 0.02 10*3/uL (ref 0.00–0.07)
Basophils Absolute: 0 10*3/uL (ref 0.0–0.1)
Basophils Relative: 0 %
Eosinophils Absolute: 0.3 10*3/uL (ref 0.0–0.5)
Eosinophils Relative: 4 %
HCT: 42.4 % (ref 39.0–52.0)
Hemoglobin: 13.3 g/dL (ref 13.0–17.0)
Immature Granulocytes: 0 %
Lymphocytes Relative: 37 %
Lymphs Abs: 2.7 10*3/uL (ref 0.7–4.0)
MCH: 31.8 pg (ref 26.0–34.0)
MCHC: 31.4 g/dL (ref 30.0–36.0)
MCV: 101.4 fL — ABNORMAL HIGH (ref 80.0–100.0)
Monocytes Absolute: 0.6 10*3/uL (ref 0.1–1.0)
Monocytes Relative: 8 %
Neutro Abs: 3.8 10*3/uL (ref 1.7–7.7)
Neutrophils Relative %: 51 %
Platelets: 171 10*3/uL (ref 150–400)
RBC: 4.18 MIL/uL — ABNORMAL LOW (ref 4.22–5.81)
RDW: 11.7 % (ref 11.5–15.5)
WBC: 7.4 10*3/uL (ref 4.0–10.5)
nRBC: 0 % (ref 0.0–0.2)

## 2018-11-16 LAB — TROPONIN I: Troponin I: <0.03 ng/mL (ref <0.03)

## 2018-11-16 LAB — COMPREHENSIVE METABOLIC PANEL
ALT: 16 U/L (ref 0–44)
AST: 22 U/L (ref 15–41)
Albumin: 3.9 g/dL (ref 3.5–5.0)
Alkaline Phosphatase: 42 U/L (ref 38–126)
Anion gap: 11 (ref 5–15)
BUN: 16 mg/dL (ref 6–20)
CO2: 27 mmol/L (ref 22–32)
Calcium: 9.6 mg/dL (ref 8.9–10.3)
Chloride: 106 mmol/L (ref 98–111)
Creatinine, Ser: 0.78 mg/dL (ref 0.61–1.24)
GFR calc Af Amer: >60 mL/min (ref >60)
GFR calc non Af Amer: >60 mL/min (ref >60)
Glucose, Bld: 100 mg/dL — ABNORMAL HIGH (ref 70–99)
Potassium: 4.4 mmol/L (ref 3.5–5.1)
Sodium: 144 mmol/L (ref 135–145)
Total Bilirubin: 0.4 mg/dL (ref 0.3–1.2)
Total Protein: 6.6 g/dL (ref 6.5–8.1)

## 2018-11-16 LAB — RAPID URINE DRUG SCREEN, HOSP PERFORMED
Amphetamines: NOT DETECTED
Barbiturates: NOT DETECTED
Benzodiazepines: POSITIVE — AB
Cocaine: NOT DETECTED
Opiates: NOT DETECTED
Tetrahydrocannabinol: NOT DETECTED

## 2018-11-16 MED ORDER — LORAZEPAM 0.5 MG PO TABS: ORAL_TABLET | ORAL | 0 refills | Status: DC

## 2018-11-16 MED ORDER — LORAZEPAM 1 MG PO TABS
1.0000 mg | ORAL_TABLET | Freq: Once | ORAL | Status: AC
  Administered 2018-11-16: 1 mg via ORAL
  Filled 2018-11-16: qty 1

## 2018-11-16 NOTE — Discharge Instructions (Addendum)
Please discuss your anxiety with the physicians at the [facility] center.  May use Ativan 3 times daily as needed for anxiety attacks.

## 2018-11-16 NOTE — ED Triage Notes (Signed)
Pt reports increase anxiety for the past month. Waking up with panic attacks. Reports out of work, worried about world etc. Denies SI, but feels hopeless. Has not has any meds in weeks, borrowed meds from family member. Unable to see psychiatrist

## 2018-11-16 NOTE — ED Notes (Signed)
Advised patient we needed urine specimen.  Provided urinal.

## 2018-11-16 NOTE — ED Provider Notes (Signed)
[facility] EMERGENCY DEPARTMENT Provider Note   CSN: [id number] Arrival date & time: 11/16/18  0931     History   Chief Complaint Chief Complaint  Patient presents with  . Anxiety    HPI Andre Lynch is a 38 y.o. male.     Patient is a 38 year old male who presents to the emergency department because of problems with anxiety.  The patient states that he has had problems with anxiety and substance abuse is since his teen years.  He has been on multiple medications for anxiety.  He says he is not been on his medications for nearly 2 years.  He is particularly concerned now because he does not have a job due to the COVID pandemic.  He says he is unable to see a psychiatrist or psychologist, and unable to get medications.  He says he has particularly noticed an increase in his anxiety over the past month.  He says he feels as though he is having panic attacks almost every other day.  Patient reports he is currently using breathing exercises and eating as his coping mechanism. Pt denies use of ETOH or illicit drugs.  He smokes approximately half a pack of cigarettes daily.  The patient denies any hallucinations.  No auditory or visual hallucinations.  He denies suicidal or homicidal ideations or plans.  He presents now for assistance with this issue and for referral to a psychiatrist.   The history is provided by the patient.    Past Medical History:  Diagnosis Date  . Anxiety   . Back pain   . Chronic knee pain   . Depression   . Gastric ulcer   . History of narcotic addiction (HCC)    methadone tx  . Kidney stones   . RLS (restless legs syndrome)   . Seizures (HCC)    last one early 2018  . Substance abuse (HCC)    cocaine, benzos, marijuana, benzos, opiates    Patient Active Problem List   Diagnosis Date Noted  . H/O: substance abuse (HCC) 07/28/2012  . GAD (generalized anxiety disorder) 07/28/2012    Past Surgical History:  Procedure Laterality Date  . HERNIA  REPAIR    . WISDOM TOOTH EXTRACTION          Home Medications    Prior to Admission medications   Medication Sig Start Date End Date Taking? Authorizing Provider  ALPRAZolam Prudy Feeler(XANAX) 1 MG tablet Take 1 tablet (1 mg total) by mouth 2 (two) times daily as needed for anxiety. 12/24/14   Bethann BerkshireZammit, Joseph, MD  buprenorphine (SUBUTEX) 8 MG SUBL SL tablet DISSOLVE 1 TABLET UNDER TONGUE 2 TIMES A DAY. 10/26/16   [provider]  LORazepam (ATIVAN) 1 MG tablet Take 1 tablet (1 mg total) by mouth 3 (three) times daily as needed for anxiety. 11/03/16   Triplett, Tammy, PA-C  polyethylene glycol (MIRALAX) packet Take 17 g by mouth daily. 11/04/16   Rancour, Jeannett SeniorStephen, MD  predniSONE (DELTASONE) 10 MG tablet Take 6 tablets day one, 5 tablets day two, 4 tablets day three, 3 tablets day four, 2 tablets day five, then 1 tablet day six 11/03/16   Pauline Ausriplett, Tammy, PA-C    Family History Family History  Problem Relation Age of Onset  . Cancer Mother        breast cancer survivor    Social History Social History   Tobacco Use  . Smoking status: Current Every Day Smoker    Packs/day: 0.50  Years: 20.00    Pack years: 10.00    Types: Cigarettes  . Smokeless tobacco: Never Used  Substance Use Topics  . Alcohol use: No  . Drug use: No     Allergies   Ibuprofen and Tylenol [acetaminophen]   Review of Systems Review of Systems  Constitutional: Negative for activity change.       All ROS Neg except as noted in HPI  HENT: Negative for nosebleeds.   Eyes: Negative for photophobia and discharge.  Respiratory: Negative for cough, shortness of breath and wheezing.   Cardiovascular: Negative for chest pain and palpitations.  Gastrointestinal: Negative for abdominal pain and blood in stool.  Genitourinary: Negative for dysuria, frequency and hematuria.  Musculoskeletal: Negative for arthralgias, back pain and neck pain.  Skin: Negative.   Neurological: Negative for dizziness, seizures and speech  difficulty.  Psychiatric/Behavioral: Negative for confusion and hallucinations. The patient is nervous/anxious.      Physical Exam Updated Vital Signs There were no vitals taken for this visit.  Physical Exam Vitals signs and nursing note reviewed.  Constitutional:      Appearance: He is well-developed. He is not toxic-appearing.  HENT:     Head: Normocephalic.     Right Ear: Tympanic membrane and external ear normal.     Left Ear: Tympanic membrane and external ear normal.  Eyes:     General: Lids are normal.     Pupils: Pupils are equal, round, and reactive to light.  Neck:     Musculoskeletal: Normal range of motion and neck supple.     Vascular: No carotid bruit.  Cardiovascular:     Rate and Rhythm: Regular rhythm. Tachycardia present.     Pulses: Normal pulses.     Heart sounds: Normal heart sounds.  Pulmonary:     Effort: No respiratory distress.     Breath sounds: Normal breath sounds.  Abdominal:     General: Bowel sounds are normal.     Palpations: Abdomen is soft.     Tenderness: There is no abdominal tenderness. There is no guarding.  Musculoskeletal: Normal range of motion.  Lymphadenopathy:     Head:     Right side of head: No submandibular adenopathy.     Left side of head: No submandibular adenopathy.     Cervical: No cervical adenopathy.  Skin:    General: Skin is warm and dry.  Neurological:     Mental Status: He is alert and oriented to person, place, and time.     Cranial Nerves: No cranial nerve deficit.     Sensory: No sensory deficit.  Psychiatric:        Speech: Speech normal.      ED Treatments / Results  Labs (all labs ordered are listed, but only abnormal results are displayed) Labs Reviewed - No data to display  EKG    Radiology No results found.  Procedures Procedures (including critical care time)  Medications Ordered in ED Medications - No data to display   Initial Impression / Assessment and Plan / ED Course  I  have reviewed the triage vital signs and the nursing notes.  Pertinent labs & imaging results that were available during my care of the patient were reviewed by me and considered in my medical decision making (see chart for details).          Final Clinical Impressions(s) / ED Diagnoses MDM  Heart rate elevated at 128, blood pressure elevated at 143/82.  Pulse oximetry is  94% on room air.  Patient complains of being anxious.  He says this is really increased over the last month.  He is not been on any medications for anxiety for nearly 2years.  Patient has a tachycardia present.  Will check screening labs to evaluate for metabolic causes of his tachycardia.  Patient also be treated with Ativan at this time. Comprehensive metabolic panel is well within normal limits.  The complete blood count is well within normal limits.  Troponin is well within normal limits.  Urine studies requested, patient states he cannot give a urine specimen at this time.  The heart rate has improved from 128 to  107 after Ativan.  No significant changes in the examination.  Patient will be discharged with a few tablets of the Ativan to use.  [facility] with his [facility].   Final diagnoses:  Anxiety    ED Discharge Orders         Ordered    LORazepam (ATIVAN) 0.5 MG tablet     11/16/18 1158           Ivery QualeBryant, Marthe Dant, PA-C 11/16/18 1230    Long, Arlyss RepressJoshua G, MD 11/16/18 [phone]

## 2019-03-06 ENCOUNTER — Other Ambulatory Visit: Payer: Self-pay

## 2019-03-06 ENCOUNTER — Encounter (HOSPITAL_COMMUNITY): Payer: Self-pay | Admitting: Emergency Medicine

## 2019-03-06 ENCOUNTER — Emergency Department (HOSPITAL_COMMUNITY)
Admission: EM | Admit: 2019-03-06 | Discharge: 2019-03-06 | Disposition: A | Discharge status: Home / Self Care | Payer: Self-pay | Attending: Emergency Medicine | Admitting: Emergency Medicine

## 2019-03-06 DIAGNOSIS — F419 Anxiety disorder, unspecified: Secondary | ICD-10-CM | POA: Insufficient documentation

## 2019-03-06 DIAGNOSIS — Z79899 Other long term (current) drug therapy: Secondary | ICD-10-CM | POA: Insufficient documentation

## 2019-03-06 DIAGNOSIS — Z760 Encounter for issue of repeat prescription: Secondary | ICD-10-CM | POA: Insufficient documentation

## 2019-03-06 DIAGNOSIS — F17210 Nicotine dependence, cigarettes, uncomplicated: Secondary | ICD-10-CM | POA: Insufficient documentation

## 2019-03-06 NOTE — ED Triage Notes (Signed)
Pt present to ED for medication refill.  Pt states " my doctor stopped giving me my benzos, he said he wasn't going to do it anymore but did not give me a referral."   Pt states he has not had a consistent prescription in two years.  Pt states " I get some benzos here and there".  C/o of anxiety and shaking.

## 2019-03-06 NOTE — ED Provider Notes (Signed)
[facility] EMERGENCY DEPARTMENT Provider Note   CSN: [id number] Arrival date & time: 03/06/19  4081     History   Chief Complaint Chief Complaint  Patient presents with  . Medication Refill    HPI Andre Lynch is a 38 y.o. male.     HPI Patient presents to the emergency department with a request for benzos.  The patient states that he needs a referral as well to a psychiatrist.  He states he has been getting benzos here and there and he feels like his anxiety is increased over the last few months.  The patient states that he is gotten benzodiazepines from different places over the last couple of years but no consistent prescriber.  Patient denies any other symptoms.  He denies being homicidal or suicidal. Past Medical History:  Diagnosis Date  . Anxiety   . Back pain   . Chronic knee pain   . Depression   . Gastric ulcer   . History of narcotic addiction ([place])    methadone tx  . Kidney stones   . RLS (restless legs syndrome)   . Seizures ([place])    last one early 2018  . Substance abuse ([place])    cocaine, benzos, marijuana, benzos, opiates    Patient Active Problem List   Diagnosis Date Noted  . H/O: substance abuse ([place]) 07/28/2012  . GAD (generalized anxiety disorder) 07/28/2012    Past Surgical History:  Procedure Laterality Date  . HERNIA REPAIR    . WISDOM TOOTH EXTRACTION          Home Medications    Prior to Admission medications   Medication Sig Start Date End Date Taking? Authorizing Provider  ALPRAZolam Duanne Moron) 1 MG tablet Take 1 tablet (1 mg total) by mouth 2 (two) times daily as needed for anxiety. Patient not taking: Reported on 11/16/2018 12/24/14   Milton Ferguson, MD  buprenorphine (SUBUTEX) 8 MG SUBL SL tablet 4 mg 2 (two) times a day.  10/26/16   [provider]  LORazepam (ATIVAN) 0.5 MG tablet 1 po tid for anxiety 11/16/18   Lily Kocher, PA-C  polyethylene glycol [facility]) packet Take 17 g by mouth daily. Patient not taking:  Reported on 11/16/2018 11/04/16   Rancour, Annie Main, MD  predniSONE (DELTASONE) 10 MG tablet Take 6 tablets day one, 5 tablets day two, 4 tablets day three, 3 tablets day four, 2 tablets day five, then 1 tablet day six Patient not taking: Reported on 11/16/2018 11/03/16   Kem Parkinson, PA-C    Family History Family History  Problem Relation Age of Onset  . Cancer Mother        breast cancer survivor    Social History Social History   Tobacco Use  . Smoking status: Current Every Day Smoker    Packs/day: 0.50    Years: 20.00    Pack years: 10.00    Types: Cigarettes  . Smokeless tobacco: Never Used  Substance Use Topics  . Alcohol use: No  . Drug use: No     Allergies   Ibuprofen and Tylenol [acetaminophen]   Review of Systems Review of Systems All other systems negative except as documented in the HPI. All pertinent positives and negatives as reviewed in the HPI.  Physical Exam Updated Vital Signs Ht 5\' 9"  (1.753 m)   Wt 63.5 kg   BMI 20.67 kg/m   Physical Exam Vitals signs and nursing note reviewed.  Constitutional:      General: He is not  in acute distress.    Appearance: He is well-developed.  HENT:     Head: Normocephalic and atraumatic.  Eyes:     Pupils: Pupils are equal, round, and reactive to light.  Pulmonary:     Effort: Pulmonary effort is normal.  Skin:    General: Skin is warm and dry.  Neurological:     Mental Status: He is alert and oriented to person, place, and time.  Psychiatric:        Mood and Affect: Mood normal.        Behavior: Behavior normal.        Thought Content: Thought content normal.      ED Treatments / Results  Labs (all labs ordered are listed, but only abnormal results are displayed) Labs Reviewed - No data to display  EKG None  Radiology No results found.  Procedures Procedures (including critical care time)  Medications Ordered in ED Medications - No data to display   Initial Impression / Assessment  and Plan / ED Course  I have reviewed the triage vital signs and the nursing notes.  Pertinent labs & imaging results that were available during my care of the patient were reviewed by me and considered in my medical decision making (see chart for details).        I will give the patient referral to psychiatry for management of his anxiety.  I do not feel giving him a prescription from the ER as the appropriate thing to do for benzodiazepines.  Is made aware that these substances need to be managed by one provider and not multiple people.  Final Clinical Impressions(s) / ED Diagnoses   Final diagnoses:  None    ED Discharge Orders    None       Charlestine Night, PA-C 03/06/19 1115    Samuel Jester, DO 03/09/19 [phone]

## 2019-03-06 NOTE — Discharge Instructions (Signed)
Follow-up with the resources provided.  Return here as needed.  You will need to see a psychiatrist for management of these medications.

## 2019-05-03 ENCOUNTER — Other Ambulatory Visit: Payer: Self-pay

## 2019-05-03 ENCOUNTER — Encounter: Payer: Self-pay | Admitting: Family Medicine

## 2019-05-03 ENCOUNTER — Ambulatory Visit (INDEPENDENT_AMBULATORY_CARE_PROVIDER_SITE_OTHER): Payer: Self-pay | Admitting: Family Medicine

## 2019-05-03 VITALS — BP 120/79 | HR 104 | Temp 97.7°F | Ht 68.0 in | Wt 152.6 lb

## 2019-05-03 DIAGNOSIS — F411 Generalized anxiety disorder: Secondary | ICD-10-CM

## 2019-05-03 NOTE — Patient Instructions (Signed)
Mental health referral to stabilize generalized anxiety with a goal to decrease buprenorphine

## 2019-05-03 NOTE — Progress Notes (Signed)
New Patient Office Visit  Subjective:  Patient ID: Andre Lynch, male    DOB: 1981/03/12  Age: 38 y.o. MRN: [id number]  CC:  Chief Complaint  Patient presents with  . Establish Care  . Anxiety    HPI Andre Lynch presents for anxiety in the past-panic attacks-daily medication-cymbalta, wellbutrin, zoloft-difficulty with eating and mood changes Pt is not currently working-hospitalized in [facility] for dehydration/electrolyte abnormality Pt addicted to pain medication-hernia surgery and tonsil taken out-pt now on buprenorphine-rx-[place] physician Pt states he went to [facility] after "getting clean"  -lived in half way house in [facility]. Pt working in [employer].  Pt became sick after becoming dehydrated Pts mom brought him back to [place]-1/20 Knee pain-right-injury in the past-no recent concern-overuse  Hernia repair in the past Past Medical History:  Diagnosis Date  . Anxiety   . Back pain   . Chronic knee pain   . Depression   . Gastric ulcer   . History of narcotic addiction ([place])    methadone tx  . Kidney stones   . RLS (restless legs syndrome)   . Seizures ([place])    last one early 2018  . Substance abuse ([place])    cocaine, benzos, marijuana, benzos, opiates    Past Surgical History:  Procedure Laterality Date  . HERNIA REPAIR    . WISDOM TOOTH EXTRACTION      Family History  Problem Relation Age of Onset  . Cancer Mother        breast cancer survivor    Social History   Socioeconomic History  . Marital status: Single    Spouse name: Not on file  . Number of children: Not on file  . Years of education: Not on file  . Highest education level: Not on file  Occupational History  . Occupation: unemployed  Social Needs  . Financial resource strain: Not on file  . Food insecurity    Worry: Not on file    Inability: Not on file  . Transportation needs    Medical: Not on file    Non-medical: Not on file  Tobacco Use  . Smoking status: Current Every Day Smoker    Packs/day: 0.50     Years: 20.00    Pack years: 10.00    Types: Cigarettes  . Smokeless tobacco: Never Used  Substance and Sexual Activity  . Alcohol use: No  . Drug use: No  . Sexual activity: Not Currently  Lifestyle  . Physical activity    Days per week: Not on file    Minutes per session: Not on file  . Stress: Not on file  Relationships  . Social Herbalist on phone: Not on file    Gets together: Not on file    Attends religious service: Not on file    Active member of club or organization: Not on file    Attends meetings of clubs or organizations: Not on file    Relationship status: Not on file  . Intimate partner violence    Fear of current or ex partner: Not on file    Emotionally abused: Not on file    Physically abused: Not on file    Forced sexual activity: Not on file  Other Topics Concern  . Not on file  Social History Narrative  . Not on file    ROS Review of Systems  Constitutional: Negative.   Respiratory: Negative.   Cardiovascular: Negative.   Gastrointestinal: Negative.   Endocrine:  Negative.   Genitourinary: Negative.   Musculoskeletal: Positive for arthralgias and joint swelling.  Skin: Negative.   Allergic/Immunologic: Negative.   Neurological: Negative.   Hematological: Negative.   Psychiatric/Behavioral: The patient is nervous/anxious.     Objective:   Today's Vitals: BP 120/79 (BP Location: Left Arm, Patient Position: Sitting, Cuff Size: Normal)   Pulse (!) 104   Temp 97.7 F (36.5 C) (Oral)   Ht 5\' 8"  (1.727 m)   Wt 152 lb 9.6 oz (69.2 kg)   SpO2 95%   BMI 23.20 kg/m   Physical Exam Constitutional:      Appearance: Normal appearance.  HENT:     Head: Normocephalic and atraumatic.  Neck:     Musculoskeletal: Normal range of motion and neck supple.  Cardiovascular:     Rate and Rhythm: Normal rate and regular rhythm.     Pulses: Normal pulses.     Heart sounds: Normal heart sounds.  Pulmonary:     Effort: Pulmonary effort is  normal.     Breath sounds: Normal breath sounds.  Neurological:     Mental Status: He is alert.  Psychiatric:        Mood and Affect: Mood normal.        Behavior: Behavior normal.        Thought Content: Thought content normal.        Judgment: Judgment normal.     Assessment & Plan:  1. GAD (generalized anxiety disorder) No meds currently-side effects-buprenorphine x 2 years Psy/counseling-referral Pt with difficulty going out especially during COVID D/w  Pt with complex behavioral health history, addiction history-medication and counseling management by behavioral health-pt agreed to referral Outpatient Encounter Medications as of 05/03/2019  Medication Sig  . buprenorphine (SUBUTEX) 8 MG SUBL SL tablet 4 mg 2 (two) times a day.   . [DISCONTINUED] ALPRAZolam (XANAX) 1 MG tablet Take 1 tablet (1 mg total) by mouth 2 (two) times daily as needed for anxiety. (Patient not taking: Reported on 11/16/2018)  . [DISCONTINUED] LORazepam (ATIVAN) 0.5 MG tablet 1 po tid for anxiety (Patient not taking: Reported on 05/03/2019)  . [DISCONTINUED] polyethylene glycol (MIRALAX) packet Take 17 g by mouth daily. (Patient not taking: Reported on 11/16/2018)  . [DISCONTINUED] predniSONE (DELTASONE) 10 MG tablet Take 6 tablets day one, 5 tablets day two, 4 tablets day three, 3 tablets day four, 2 tablets day five, then 1 tablet day six (Patient not taking: Reported on 11/16/2018)   No facility-administered encounter medications on file as of 05/03/2019.   Follow-up: psy referral/counseling referral   14/07/2018, MD

## 2019-12-10 ENCOUNTER — Other Ambulatory Visit: Payer: Self-pay

## 2019-12-10 ENCOUNTER — Telehealth (HOSPITAL_COMMUNITY): Payer: Self-pay | Admitting: Clinical

## 2019-12-10 ENCOUNTER — Ambulatory Visit (HOSPITAL_COMMUNITY): Payer: 59 | Admitting: Clinical

## 2019-12-10 NOTE — Telephone Encounter (Signed)
The OPT therapist attempted text to session x2 @ 8:00AM and 8:10AM, however, the patient did not respond missing their scheduled session.  

## 2019-12-17 ENCOUNTER — Ambulatory Visit (INDEPENDENT_AMBULATORY_CARE_PROVIDER_SITE_OTHER): Payer: 59 | Admitting: Clinical

## 2019-12-17 DIAGNOSIS — F4310 Post-traumatic stress disorder, unspecified: Secondary | ICD-10-CM

## 2019-12-17 DIAGNOSIS — F419 Anxiety disorder, unspecified: Secondary | ICD-10-CM

## 2019-12-17 DIAGNOSIS — F331 Major depressive disorder, recurrent, moderate: Secondary | ICD-10-CM | POA: Diagnosis not present
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
## 2019-12-27 ENCOUNTER — Telehealth (HOSPITAL_COMMUNITY): Payer: Self-pay | Admitting: Clinical

## 2019-12-27 NOTE — Telephone Encounter (Signed)
Called patient advised referral declined by med management

## 2019-12-31 ENCOUNTER — Telehealth (HOSPITAL_COMMUNITY): Payer: Self-pay | Admitting: Clinical

## 2019-12-31 ENCOUNTER — Other Ambulatory Visit: Payer: Self-pay

## 2019-12-31 ENCOUNTER — Ambulatory Visit (HOSPITAL_COMMUNITY): Payer: 59 | Admitting: Clinical

## 2019-12-31 NOTE — Telephone Encounter (Signed)
The OPT therapist send a text to session message and then followed up with a phone call and left a VM, however, the patient did not respond missing their scheduled session.

## 2020-05-17 DIAGNOSIS — F1210 Cannabis abuse, uncomplicated: Secondary | ICD-10-CM | POA: Insufficient documentation

## 2020-06-24 ENCOUNTER — Inpatient Hospital Stay (HOSPITAL_COMMUNITY)
Admission: EM | Admit: 2020-06-24 | Discharge: 2020-06-27 | DRG: 896 | Disposition: A | Discharge status: Home / Self Care | Payer: 59 | Attending: Internal Medicine | Admitting: Internal Medicine

## 2020-06-24 ENCOUNTER — Encounter (HOSPITAL_COMMUNITY): Payer: Self-pay

## 2020-06-24 ENCOUNTER — Emergency Department (HOSPITAL_COMMUNITY): Payer: 59

## 2020-06-24 ENCOUNTER — Other Ambulatory Visit: Payer: Self-pay

## 2020-06-24 ENCOUNTER — Observation Stay (HOSPITAL_COMMUNITY): Payer: 59

## 2020-06-24 DIAGNOSIS — F19231 Other psychoactive substance dependence with withdrawal delirium: Secondary | ICD-10-CM | POA: Diagnosis present

## 2020-06-24 DIAGNOSIS — G928 Other toxic encephalopathy: Secondary | ICD-10-CM | POA: Diagnosis present

## 2020-06-24 DIAGNOSIS — F17210 Nicotine dependence, cigarettes, uncomplicated: Secondary | ICD-10-CM | POA: Diagnosis present

## 2020-06-24 DIAGNOSIS — F1193 Opioid use, unspecified with withdrawal: Secondary | ICD-10-CM

## 2020-06-24 DIAGNOSIS — F13230 Sedative, hypnotic or anxiolytic dependence with withdrawal, uncomplicated: Secondary | ICD-10-CM

## 2020-06-24 DIAGNOSIS — Z20822 Contact with and (suspected) exposure to covid-19: Secondary | ICD-10-CM | POA: Diagnosis present

## 2020-06-24 DIAGNOSIS — F1990 Other psychoactive substance use, unspecified, uncomplicated: Secondary | ICD-10-CM

## 2020-06-24 DIAGNOSIS — F99 Mental disorder, not otherwise specified: Secondary | ICD-10-CM

## 2020-06-24 DIAGNOSIS — G9340 Encephalopathy, unspecified: Secondary | ICD-10-CM

## 2020-06-24 DIAGNOSIS — F13930 Sedative, hypnotic or anxiolytic use, unspecified with withdrawal, uncomplicated: Secondary | ICD-10-CM

## 2020-06-24 DIAGNOSIS — F1123 Opioid dependence with withdrawal: Secondary | ICD-10-CM | POA: Diagnosis not present

## 2020-06-24 DIAGNOSIS — G894 Chronic pain syndrome: Secondary | ICD-10-CM

## 2020-06-24 DIAGNOSIS — E876 Hypokalemia: Secondary | ICD-10-CM | POA: Diagnosis present

## 2020-06-24 DIAGNOSIS — F1911 Other psychoactive substance abuse, in remission: Secondary | ICD-10-CM | POA: Diagnosis present

## 2020-06-24 DIAGNOSIS — F411 Generalized anxiety disorder: Secondary | ICD-10-CM | POA: Diagnosis present

## 2020-06-24 DIAGNOSIS — G2581 Restless legs syndrome: Secondary | ICD-10-CM | POA: Diagnosis present

## 2020-06-24 DIAGNOSIS — R569 Unspecified convulsions: Secondary | ICD-10-CM

## 2020-06-24 DIAGNOSIS — F22 Delusional disorders: Secondary | ICD-10-CM

## 2020-06-24 DIAGNOSIS — F1999 Other psychoactive substance use, unspecified with unspecified psychoactive substance-induced disorder: Secondary | ICD-10-CM | POA: Diagnosis present

## 2020-06-24 DIAGNOSIS — F13239 Sedative, hypnotic or anxiolytic dependence with withdrawal, unspecified: Secondary | ICD-10-CM | POA: Diagnosis present

## 2020-06-24 DIAGNOSIS — Z781 Physical restraint status: Secondary | ICD-10-CM

## 2020-06-24 DIAGNOSIS — R451 Restlessness and agitation: Secondary | ICD-10-CM | POA: Diagnosis not present

## 2020-06-24 LAB — CBC WITH DIFFERENTIAL/PLATELET
Abs Immature Granulocytes: 0.03 10*3/uL (ref 0.00–0.07)
Basophils Absolute: 0.1 10*3/uL (ref 0.0–0.1)
Basophils Relative: 1 %
Eosinophils Absolute: 0 10*3/uL (ref 0.0–0.5)
Eosinophils Relative: 0 %
HCT: 48.1 % (ref 39.0–52.0)
Hemoglobin: 16.4 g/dL (ref 13.0–17.0)
Immature Granulocytes: 0 %
Lymphocytes Relative: 26 %
Lymphs Abs: 2.5 10*3/uL (ref 0.7–4.0)
MCH: 32.7 pg (ref 26.0–34.0)
MCHC: 34.1 g/dL (ref 30.0–36.0)
MCV: 96 fL (ref 80.0–100.0)
Monocytes Absolute: 0.7 10*3/uL (ref 0.1–1.0)
Monocytes Relative: 7 %
Neutro Abs: 6.6 10*3/uL (ref 1.7–7.7)
Neutrophils Relative %: 66 %
Platelets: 295 10*3/uL (ref 150–400)
RBC: 5.01 MIL/uL (ref 4.22–5.81)
RDW: 12.2 % (ref 11.5–15.5)
WBC: 9.9 10*3/uL (ref 4.0–10.5)
nRBC: 0 % (ref 0.0–0.2)

## 2020-06-24 LAB — COMPREHENSIVE METABOLIC PANEL
ALT: 13 U/L (ref 0–44)
AST: 16 U/L (ref 15–41)
Albumin: 4.4 g/dL (ref 3.5–5.0)
Alkaline Phosphatase: 47 U/L (ref 38–126)
Anion gap: 10 (ref 5–15)
BUN: 15 mg/dL (ref 6–20)
CO2: 24 mmol/L (ref 22–32)
Calcium: 9.4 mg/dL (ref 8.9–10.3)
Chloride: 105 mmol/L (ref 98–111)
Creatinine, Ser: 0.87 mg/dL (ref 0.61–1.24)
GFR, Estimated: >60 mL/min (ref >60)
Glucose, Bld: 115 mg/dL — ABNORMAL HIGH (ref 70–99)
Potassium: 3.4 mmol/L — ABNORMAL LOW (ref 3.5–5.1)
Sodium: 139 mmol/L (ref 135–145)
Total Bilirubin: 0.8 mg/dL (ref 0.3–1.2)
Total Protein: 7.4 g/dL (ref 6.5–8.1)

## 2020-06-24 LAB — RAPID URINE DRUG SCREEN, HOSP PERFORMED
Amphetamines: POSITIVE — AB
Barbiturates: NOT DETECTED
Benzodiazepines: POSITIVE — AB
Cocaine: NOT DETECTED
Opiates: NOT DETECTED
Tetrahydrocannabinol: NOT DETECTED

## 2020-06-24 LAB — URINALYSIS, ROUTINE W REFLEX MICROSCOPIC
Bacteria, UA: NONE SEEN
Glucose, UA: NEGATIVE mg/dL
Hgb urine dipstick: NEGATIVE
Ketones, ur: NEGATIVE mg/dL
Leukocytes,Ua: NEGATIVE
Nitrite: NEGATIVE
Protein, ur: 30 mg/dL — AB
Specific Gravity, Urine: 1.029 (ref 1.005–1.030)
pH: 5 (ref 5.0–8.0)

## 2020-06-24 LAB — SARS CORONAVIRUS 2 (TAT 6-24 HRS): SARS Coronavirus 2: NEGATIVE

## 2020-06-24 LAB — ETHANOL: Alcohol, Ethyl (B): <10 mg/dL (ref <10)

## 2020-06-24 LAB — TSH: TSH: 1.69 u[IU]/mL (ref 0.350–4.500)

## 2020-06-24 MED ORDER — STERILE WATER FOR INJECTION IJ SOLN
INTRAMUSCULAR | Status: AC
  Filled 2020-06-24: qty 10

## 2020-06-24 MED ORDER — SODIUM CHLORIDE 0.9 % IV BOLUS
500.0000 mL | Freq: Once | INTRAVENOUS | Status: AC
  Administered 2020-06-24: 500 mL via INTRAVENOUS

## 2020-06-24 MED ORDER — BUPRENORPHINE HCL 0.3 MG/ML IJ SOLN: 0.3000 mg | Freq: Once | INTRAMUSCULAR | Status: DC

## 2020-06-24 MED ORDER — ADULT MULTIVITAMIN W/MINERALS CH
1.0000 | ORAL_TABLET | Freq: Every day | ORAL | Status: DC
  Administered 2020-06-25 – 2020-06-27 (×3): 1 via ORAL
  Filled 2020-06-24 (×3): qty 1

## 2020-06-24 MED ORDER — LORAZEPAM 2 MG/ML IJ SOLN
1.0000 mg | Freq: Once | INTRAMUSCULAR | Status: AC
  Administered 2020-06-24: 1 mg via INTRAVENOUS
  Filled 2020-06-24: qty 1

## 2020-06-24 MED ORDER — LORAZEPAM 2 MG/ML IJ SOLN: 0.0000 mg | Freq: Two times a day (BID) | INTRAMUSCULAR | Status: DC

## 2020-06-24 MED ORDER — LORAZEPAM 1 MG PO TABS
0.0000 mg | ORAL_TABLET | Freq: Four times a day (QID) | ORAL | Status: DC
  Administered 2020-06-24: 4 mg via ORAL
  Filled 2020-06-24: qty 4

## 2020-06-24 MED ORDER — THIAMINE HCL 100 MG/ML IJ SOLN: 100.0000 mg | Freq: Every day | INTRAMUSCULAR | Status: DC

## 2020-06-24 MED ORDER — ACETAMINOPHEN 650 MG RE SUPP: 650.0000 mg | Freq: Four times a day (QID) | RECTAL | Status: DC | PRN

## 2020-06-24 MED ORDER — KCL IN DEXTROSE-NACL 20-5-0.9 MEQ/L-%-% IV SOLN
INTRAVENOUS | Status: DC
  Filled 2020-06-24 (×3): qty 1000

## 2020-06-24 MED ORDER — LORAZEPAM 2 MG/ML IJ SOLN
1.0000 mg | INTRAMUSCULAR | Status: DC | PRN
  Administered 2020-06-26 (×2): 2 mg via INTRAVENOUS
  Filled 2020-06-24 (×2): qty 1

## 2020-06-24 MED ORDER — LORAZEPAM 1 MG PO TABS
1.0000 mg | ORAL_TABLET | ORAL | Status: DC | PRN
  Administered 2020-06-27: 1 mg via ORAL
  Filled 2020-06-24: qty 1

## 2020-06-24 MED ORDER — LORAZEPAM 2 MG/ML IJ SOLN
0.0000 mg | Freq: Four times a day (QID) | INTRAMUSCULAR | Status: AC
  Administered 2020-06-25 – 2020-06-26 (×2): 2 mg via INTRAVENOUS
  Filled 2020-06-24 (×3): qty 1

## 2020-06-24 MED ORDER — LORAZEPAM 2 MG/ML IJ SOLN
0.0000 mg | Freq: Four times a day (QID) | INTRAMUSCULAR | Status: DC
  Administered 2020-06-24: 4 mg via INTRAVENOUS
  Filled 2020-06-24: qty 2

## 2020-06-24 MED ORDER — SODIUM CHLORIDE 0.9 % IV BOLUS
1000.0000 mL | Freq: Once | INTRAVENOUS | Status: AC
  Administered 2020-06-24: 1000 mL via INTRAVENOUS

## 2020-06-24 MED ORDER — FENTANYL CITRATE (PF) 100 MCG/2ML IJ SOLN
100.0000 ug | INTRAMUSCULAR | Status: DC | PRN
  Administered 2020-06-24 (×2): 100 ug via INTRAVENOUS
  Filled 2020-06-24 (×2): qty 2

## 2020-06-24 MED ORDER — HYDROXYZINE HCL 50 MG/ML IM SOLN
50.0000 mg | Freq: Once | INTRAMUSCULAR | Status: AC
  Administered 2020-06-24: 50 mg via INTRAMUSCULAR
  Filled 2020-06-24: qty 1

## 2020-06-24 MED ORDER — POTASSIUM CHLORIDE IN NACL 20-0.9 MEQ/L-% IV SOLN
INTRAVENOUS | Status: AC
  Filled 2020-06-24 (×2): qty 1000

## 2020-06-24 MED ORDER — ZIPRASIDONE MESYLATE 20 MG IM SOLR
20.0000 mg | Freq: Once | INTRAMUSCULAR | Status: AC
  Administered 2020-06-24: 20 mg via INTRAMUSCULAR
  Filled 2020-06-24: qty 20

## 2020-06-24 MED ORDER — THIAMINE HCL 100 MG PO TABS
100.0000 mg | ORAL_TABLET | Freq: Every day | ORAL | Status: DC
  Administered 2020-06-25 – 2020-06-27 (×3): 100 mg via ORAL
  Filled 2020-06-24 (×3): qty 1

## 2020-06-24 MED ORDER — ACETAMINOPHEN 325 MG PO TABS: 650.0000 mg | ORAL_TABLET | Freq: Four times a day (QID) | ORAL | Status: DC | PRN

## 2020-06-24 MED ORDER — LORAZEPAM 2 MG/ML IJ SOLN
2.0000 mg | Freq: Once | INTRAMUSCULAR | Status: AC
  Administered 2020-06-24: 2 mg via INTRAMUSCULAR
  Filled 2020-06-24: qty 1

## 2020-06-24 MED ORDER — BUPRENORPHINE HCL-NALOXONE HCL 8-2 MG SL SUBL
1.0000 | SUBLINGUAL_TABLET | Freq: Every day | SUBLINGUAL | Status: DC
  Administered 2020-06-24: 1 via SUBLINGUAL
  Filled 2020-06-24: qty 1

## 2020-06-24 MED ORDER — LORAZEPAM 2 MG/ML IJ SOLN
0.0000 mg | Freq: Two times a day (BID) | INTRAMUSCULAR | Status: DC
  Administered 2020-06-26 – 2020-06-27 (×2): 2 mg via INTRAVENOUS
  Filled 2020-06-24 (×2): qty 1

## 2020-06-24 MED ORDER — ONDANSETRON HCL 4 MG PO TABS: 4.0000 mg | ORAL_TABLET | Freq: Four times a day (QID) | ORAL | Status: DC | PRN

## 2020-06-24 MED ORDER — DEXMEDETOMIDINE HCL IN NACL 200 MCG/50ML IV SOLN
0.4000 ug/kg/h | INTRAVENOUS | Status: DC
  Administered 2020-06-24: 0.4 ug/kg/h via INTRAVENOUS
  Administered 2020-06-25: 0.5 ug/kg/h via INTRAVENOUS

## 2020-06-24 MED ORDER — THIAMINE HCL 100 MG PO TABS
100.0000 mg | ORAL_TABLET | Freq: Every day | ORAL | Status: DC
  Administered 2020-06-24: 100 mg via ORAL
  Filled 2020-06-24: qty 1

## 2020-06-24 MED ORDER — LORAZEPAM 1 MG PO TABS: 0.0000 mg | ORAL_TABLET | Freq: Two times a day (BID) | ORAL | Status: DC

## 2020-06-24 MED ORDER — ENOXAPARIN SODIUM 40 MG/0.4ML ~~LOC~~ SOLN
40.0000 mg | Freq: Every day | SUBCUTANEOUS | Status: DC
  Filled 2020-06-24 (×2): qty 0.4

## 2020-06-24 MED ORDER — ONDANSETRON HCL 4 MG/2ML IJ SOLN
4.0000 mg | Freq: Once | INTRAMUSCULAR | Status: AC
  Administered 2020-06-24: 4 mg via INTRAVENOUS
  Filled 2020-06-24: qty 2

## 2020-06-24 MED ORDER — ONDANSETRON HCL 4 MG/2ML IJ SOLN: 4.0000 mg | Freq: Four times a day (QID) | INTRAMUSCULAR | Status: DC | PRN

## 2020-06-24 MED ORDER — FOLIC ACID 1 MG PO TABS
1.0000 mg | ORAL_TABLET | Freq: Every day | ORAL | Status: DC
  Administered 2020-06-25 – 2020-06-27 (×3): 1 mg via ORAL
  Filled 2020-06-24 (×3): qty 1

## 2020-06-24 NOTE — Progress Notes (Signed)
Pt to CT

## 2020-06-24 NOTE — ED Notes (Signed)
Removing BP cuff and O2 monitor will not leave in place, lifted fist up and said to stop messing with him his BP is fine. Security notified and at bedside. MD aware. Will cont to monitor.

## 2020-06-24 NOTE — ED Provider Notes (Signed)
Patient with persistent delirium and agitation after giving 3 doses of Ativan some Geodon Vistaril and Suboxone.  Patient continues to be agitated confused and tachycardic.  His urine drug screen was positive for amphetamines and benzos.  He will be admitted to medicine for withdrawal symptoms   Bethann Berkshire, MD 06/24/20 1736

## 2020-06-24 NOTE — ED Notes (Signed)
Getting out the bed, stumbling around room, pulling wires, talking to people who are not present, attempted to redirect. MD notified with N.O. for nonviolent restraints.

## 2020-06-24 NOTE — BH Assessment (Addendum)
TTS ordered for this patient. TTS requested machine to be placed in patient's room for his initial TTS assessment.   As this clinician attempted to assess patient he was observed  laying on the bed, restless, attempting to sit up and back down to a laying position multiple times, attempting to get out of the bed completely, confused, trembling, and his speech was garbled. He was observed mumbling, mostly. He is not oriented to person, place, and/or situation.  He believes that he is in a basement. Patient does not know any identifying information such as name and DOB.  Mr. Methot was unable to answer questions even with redirection or when his name was called.    His presentation was of concern. Therefore, TTS clinician requested [facility] provider Marciano Sequin, NP) to observe his current presentation. The provider observed and concurred that patient doesn't seem to be appropriate for a TTS assessment at this time.  Discussed concerns with  RN Herbert Seta) and EDP (Dr. Estell Harpin). Per EDP, patient to be admitted. He will now be under the care of the Internal Medicine Staff on the medical floor.  Clinician informed EDP that TTS consult would be removed at this time. Internal Medicine to re-consult TTS consult, if still needed after he becomes stable.

## 2020-06-24 NOTE — ED Provider Notes (Signed)
[facility] EMERGENCY DEPARTMENT Provider Note   CSN: [id number] Arrival date & time: 06/24/20  4696     History Chief Complaint  Patient presents with  . Tachycardia    Andre Lynch is a 40 y.o. male.  HPI He presents for evaluation of general weakness, difficulty walking and chest discomfort.  Onset of the symptoms several days ago.  He also feels "like I am having an anxiety attack.  He has not had either his Klonopin or buprenorphine, in 3 to 4 days because he ran out.  He takes buprenorphine for chronic pain.  He has prior knee injuries, and is prescribed this medication by a primary care sports medicine physician.  He denies fever, chills, cough, shortness of breath.  He feels like his legs are numb.  He previously had similar problem with his legs when his "citric acid was low."  When queried he was not sure about this diagnosis but states he was told that at a hospital in [place].  He is currently living in the area, and notes that his grandfather died several days ago.  He thinks he might try to get off of the Klonopin, and buprenorphine.  He understands that he would go through withdrawal should he do that.  There are no other known modifying factors.    Past Medical History:  Diagnosis Date  . Anxiety   . Back pain   . Chronic knee pain   . Depression   . Gastric ulcer   . 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 Kidney stones   . RLS (restless legs syndrome)   . Seizures ([place])    last one early 2018  . Substance abuse ([place])    cocaine, benzos, marijuana, benzos, opiates    Patient Active Problem List   Diagnosis Date Noted  . H/O: substance abuse ([place]) 07/28/2012  . GAD (generalized anxiety disorder) 07/28/2012    Past Surgical History:  Procedure Laterality Date  . HERNIA REPAIR    . WISDOM TOOTH EXTRACTION         Family History  Problem Relation Age of Onset  . Cancer Mother        breast cancer survivor    Social History    Tobacco Use  . Smoking status: Current Every Day Smoker    Packs/day: 0.50    Years: 20.00    Pack years: 10.00    Types: Cigarettes  . Smokeless tobacco: Never Used  Substance Use Topics  . Alcohol use: Not Currently  . Drug use: Not Currently    Home Medications Prior to Admission medications   Medication Sig Start Date End Date Taking? Authorizing Provider  buprenorphine (SUBUTEX) 8 MG SUBL SL tablet 4 mg 2 (two) times a day.  10/26/16  Yes [provider]  clonazePAM (KLONOPIN) 1 MG tablet Take 1 mg by mouth 3 (three) times daily as needed. 05/17/20  Yes [provider]    Allergies    Ibuprofen and Tylenol [acetaminophen]  Review of Systems   Review of Systems  All other systems reviewed and are negative.   Physical Exam Updated Vital Signs BP (!) 142/98   Pulse 78   Temp 97.7 F (36.5 C) (Oral)   Resp 20   Ht 5' 9"  (1.753 m)   Wt 65.8 kg   SpO2 98%   BMI 21.41 kg/m   Physical Exam Vitals and nursing note reviewed.  Constitutional:      General:  He is not in acute distress.    Appearance: He is well-developed and well-nourished. He is not ill-appearing, toxic-appearing or diaphoretic.     Comments: He appears under nourished  HENT:     Head: Normocephalic and atraumatic.     Right Ear: External ear normal.     Left Ear: External ear normal.  Eyes:     Extraocular Movements: EOM normal.     Conjunctiva/sclera: Conjunctivae normal.     Pupils: Pupils are equal, round, and reactive to light.  Neck:     Trachea: Phonation normal.  Cardiovascular:     Rate and Rhythm: Normal rate.  Pulmonary:     Effort: Pulmonary effort is normal.  Chest:     Chest wall: No bony tenderness.  Abdominal:     General: There is no distension.  Musculoskeletal:        General: No swelling. Normal range of motion.     Cervical back: Normal range of motion and neck supple.  Skin:    General: Skin is warm, dry and intact.  Neurological:     Mental  Status: He is alert and oriented to person, place, and time.     Cranial Nerves: No cranial nerve deficit.     Sensory: No sensory deficit.     Motor: No abnormal muscle tone.     Coordination: Coordination normal.     Comments: No dysarthria or aphasia.  No tremor.  Psychiatric:        Mood and Affect: Mood and affect and mood normal.        Behavior: Behavior normal.        Thought Content: Thought content normal.        Judgment: Judgment normal.     ED Results / Procedures / Treatments   Labs (all labs ordered are listed, but only abnormal results are displayed) Labs Reviewed  COMPREHENSIVE METABOLIC PANEL - Abnormal; Notable for the following components:      Result Value   Potassium 3.4 (*)    Glucose, Bld 115 (*)    All other components within normal limits  SARS CORONAVIRUS 2 (TAT 6-24 HRS)  CBC WITH DIFFERENTIAL/PLATELET  RAPID URINE DRUG SCREEN, HOSP PERFORMED  URINALYSIS, ROUTINE W REFLEX MICROSCOPIC  ETHANOL    EKG EKG Interpretation  Date/Time:  Tuesday June 24 2020 08:29:57 EST Ventricular Rate:  115 PR Interval:    QRS Duration: 95 QT Interval:  295 QTC Calculation: 408 R Axis:   85 Text Interpretation: Sinus tachycardia Biatrial enlargement Borderline repolarization abnormality since last tracing no significant change Confirmed by Daleen Bo [phone]) on 06/24/2020 8:32:07 AM   Radiology No results found.  Procedures .Critical Care Performed by: Daleen Bo, MD Authorized by: Daleen Bo, MD   Critical care provider statement:    Critical care time (minutes):  70   Critical care start time:  06/24/2020 8:30 AM   Critical care end time:  06/24/2020 3:03 PM   Critical care time was exclusive of:  Separately billable procedures and treating other patients   Critical care was necessary to treat or prevent imminent or life-threatening deterioration of the following conditions: Psychiatric crisis.   Critical care was time spent personally by  me on the following activities:  Blood draw for specimens, development of treatment plan with patient or surrogate, discussions with consultants, evaluation of patient's response to treatment, examination of patient, obtaining history from patient or surrogate, ordering and performing treatments and interventions, ordering and review of laboratory  studies, pulse oximetry, re-evaluation of patient's condition, review of old charts and ordering and review of radiographic studies     Medications Ordered in ED Medications  fentaNYL (SUBLIMAZE) injection 100 mcg (100 mcg Intravenous Given 06/24/20 1042)  buprenorphine-naloxone (SUBOXONE) 8-2 mg per SL tablet 1 tablet (1 tablet Sublingual Given 06/24/20 1126)  sodium chloride 0.9 % bolus 500 mL (0 mLs Intravenous Stopped 06/24/20 1135)  ondansetron (ZOFRAN) injection 4 mg (4 mg Intravenous Given 06/24/20 1002)  LORazepam (ATIVAN) injection 1 mg (1 mg Intravenous Given 06/24/20 1002)  LORazepam (ATIVAN) injection 1 mg (1 mg Intravenous Given 06/24/20 1059)  sodium chloride 0.9 % bolus 1,000 mL (1,000 mLs Intravenous New Bag/Given 06/24/20 1059)  ziprasidone (GEODON) injection 20 mg (20 mg Intramuscular Given 06/24/20 1203)  LORazepam (ATIVAN) injection 2 mg (2 mg Intramuscular Given 06/24/20 1202)  sterile water (preservative free) injection (  Given 06/24/20 1209)  hydrOXYzine (VISTARIL) injection 50 mg (50 mg Intramuscular Given 06/24/20 1434)    ED Course  I have reviewed the triage vital signs and the nursing notes.  Pertinent labs & imaging results that were available during my care of the patient were reviewed by me and considered in my medical decision making (see chart for details).  Clinical Course as of 06/24/20 1503  Tue Jun 24, 2020  1054 Patient anxious, sitting up, heart rate 165 on monitor, acutely delirious.  Pleasant and responsive at this time.  Additional medication has been ordered. [EW]  1153 He is increasingly agitated despite being  treated with buprenorphine, and lorazepam, twice.  He has removed his IV.  He remains delusional.  Additional medication, Geodon and more Ativan ordered, intermuscular [EW]    Clinical Course User Index [EW] Daleen Bo, MD   MDM Rules/Calculators/A&P                           Patient Vitals for the past 24 hrs:  BP Temp Temp src Pulse Resp SpO2 Height Weight  06/24/20 1500 (!) 142/98 - - 78 20 98 % - -  06/24/20 1425 - - - 71 20 100 % - -  06/24/20 1421 (!) 141/71 - - (!) 127 (!) 21 100 % - -  06/24/20 1330 (!) 116/91 - - - (!) 34 - - -  06/24/20 1305 125/80 - - (!) 137 19 96 % - -  06/24/20 1240 (!) 136/109 - - - (!) 22 99 % - -  06/24/20 1215 (!) 122/96 - - - (!) 30 - - -  06/24/20 1200 (!) 141/113 - - (!) 120 (!) 29 99 % - -  06/24/20 1145 - - - - (!) 27 - - -  06/24/20 1130 (!) 138/124 - - 80 (!) 29 95 % - -  06/24/20 1115 - - - (!) 151 15 95 % - -  06/24/20 1100 (!) 125/105 - - (!) 159 15 95 % - -  06/24/20 1045 - - - (!) 144 17 95 % - -  06/24/20 1035 (!) 159/96 - - (!) 172 (!) 38 94 % - -  06/24/20 1030 - - - (!) 39 (!) 26 98 % - -  06/24/20 1015 - - - (!) 151 18 97 % - -  06/24/20 1000 117/78 - - 99 17 97 % - -  06/24/20 0945 - - - (!) 101 (!) 25 98 % - -  06/24/20 0930 104/65 - - (!) 104 (!) 23 98 % - -  06/24/20 0915 - - - (!) 104 17 97 % - -  06/24/20 0900 (!) 131/104 - - (!) 108 11 98 % - -  06/24/20 0845 - - - (!) 104 17 99 % - -  06/24/20 0830 (!) 122/99 - - (!) 117 19 98 % - -  06/24/20 0825 (!) 126/107 97.7 F (36.5 C) Oral (!) 124 20 98 % - -  06/24/20 0757 - - - - - - 5' 9"  (1.753 m) 65.8 kg    3:00 PM Reevaluation with update and discussion. After initial assessment and treatment, an updated evaluation reveals he remains restless despite being in restraints, and having multiple doses of sedating medications. He continues to be delusional. Daleen Bo   Medical Decision Making:  This patient is presenting for evaluation of nonspecific symptoms  including weakness, numbness, chest discomfort, which does require a range of treatment options, and is a complaint that involves a high risk of morbidity and mortality. The differential diagnoses include metabolic disorder, acute illness, withdrawal from benzodiazepine and buprenorphine. I decided to review old records, and in summary middle-aged male presenting with complex of symptoms, including anxiety, drug withdrawal, and weakness.  He takes buprenorphine, for chronic pain.  He does not have documented opiate use disorder.  He is out of Klonopin and buprenorphine, because of difficulties associated with recent family emergency.  I did not require additional historical information from anyone.  Clinical Laboratory Tests Ordered, included CBC, Metabolic panel and Urine drug screen, alcohol level. Review indicates initial test results are reassuring, c-Met with minimal abnormalities and CBC normal. Urine drug screen, alcohol level, and Covid testing are all pending at the time of shift change.  Critical Interventions-clinical evaluation, laboratory testing, medication treatment, observation reassessment  After These Interventions, the Patient was reevaluated and was found initially with concerns for withdrawal from benzodiazepine and buprenorphine. Patient had progression of symptoms with clear evidence for acute psychosis disorder with delusional behavior. I do not think this represents narcotic withdrawal. He is not known to be an alcoholic. Will cover for possible Warnicke's encephalopathy with a dose of thiamine. Patient requires ongoing management in the ED, and ultimately will need to see psychiatry, for psychiatric evaluation, and likely hospitalization for stabilization. Patient has had to be restrained for patient safety.  CRITICAL CARE-yes Performed by: Daleen Bo  Nursing Notes Reviewed/ Care Coordinated Applicable Imaging Reviewed Interpretation of Laboratory Data incorporated into  ED treatment   Plan of care transferred to oncoming provider team, who will ultimately contact TTS when patient stable for evaluation by them.    Final Clinical Impression(s) / ED Diagnoses Final diagnoses:  Delusional disorder ([place])  Narcotic withdrawal ([place])  Benzodiazepine withdrawal without complication [facility])    Rx / [place] Orders ED Discharge Orders    None       Daleen Bo, MD 06/24/20 1513

## 2020-06-24 NOTE — ED Notes (Signed)
Pt is not acting violent to staff. Restraint order changed to nonviolent restraints - utilizing bilateral soft wrist and soft ankle restraints at this time. AROM in extremities. Denies discomfort. 2+ pulse in bilateral radial and dorsalis pedis. Will cont to monitor

## 2020-06-24 NOTE — H&P (Signed)
History and Physical    Andre Lynch:[id number] DOB: 07-11-1980 DOA: 06/24/2020  PCP: [name], MD   Patient coming from: Home  I have personally briefly reviewed patient's old medical records in [facility]  Chief Complaint: Heart Racing  HPI: Andre Lynch is a 40 y.o. male with medical history significant for substance abuse, seizures, restless leg syndrome. At the time of my evaluation, patient is delirious, agitated, pulling against restraints, hence history was obtained from chart review and from talking to mom. Patient presented to the ED reporting that he felt his heart was racing fast, this started at about 5 AM this morning and it was getting worse.  Patient reported he had not taking his chronic pain of Subutex and Klonopin 3 to 4 days. I talked to patient's mom, she reported patient stays in her house, but she does not stay with him.  But she brings food for him frequently, he has no means to take care of himself as he does not work.  She reports this morning patient was talking out of his head.  She does not think patient drinks alcohol, she is not aware of any other drugs-illicit or otherwise he uses.  She denies seizures, but reports this morning patient was tremulous.   She reports Christmas eve, patient took something and passed out.  Patient has been to rehab for substance use in the past.  She reports patient is not taking care of himself, does not eat and he has lost a lot of weight. She reports patient told her about 3 to 4 weeks ago that he had stopped taking his medications.  She does not know if he started taking them again.  But she had advised him he could not stop the medication on his own.  ED Course: Tachycardic to 159, blood pressure systolic 100s to 412I.  Respiratory rate 17-34.  O2 sats 92 to 100% on room air.  Potassium 3.4 otherwise unremarkable CMP and CBC.  Alcohol level less than 10.  UDS positive for benzos and amphetamines.  EKG showed  sinus tachycardia.  1.5 L bolus given. Patient was given several medications in the ED which included-multiple doses of lorazepam up to 4 mg, Geodon 20 mg, Vistaril 50 mg, 50 mg of fentanyl.  Patient was still agitated, pulling at restraints, pulling out IVs multiple times, talking to people who were not present, stumbling around in the room, and falling against the wall. Patient was subsequently IVCD. Hospitalist to admit.  Review of Systems: Unable to ascertain due to altered mental status.  Past Medical History:  Diagnosis Date  . Anxiety   . Back pain   . Chronic knee pain   . Depression   . Gastric ulcer   . History of narcotic addiction ([facility])    methadone tx  . Kidney stones   . RLS (restless legs syndrome)   . Seizures ([facility])    last one early 2018  . Substance abuse ([facility])    cocaine, benzos, marijuana, benzos, opiates    Past Surgical History:  Procedure Laterality Date  . HERNIA REPAIR    . WISDOM TOOTH EXTRACTION       reports that he has been smoking cigarettes. He has a 10.00 pack-year smoking history. He has never used smokeless tobacco. He reports previous alcohol use. He reports previous drug use.  Allergies  Allergen Reactions  . Ibuprofen Other (See Comments)    Cannot take due to stomach ulcers  .  Tylenol [Acetaminophen] Other (See Comments)    Cannot take due to stomach ulcers    Family History  Problem Relation Age of Onset  . Cancer Mother        breast cancer survivor    Prior to Admission medications   Medication Sig Start Date End Date Taking? Authorizing Provider  buprenorphine (SUBUTEX) 8 MG SUBL SL tablet 4 mg 2 (two) times a day.  10/26/16  Yes [provider]  clonazePAM (KLONOPIN) 1 MG tablet Take 1 mg by mouth 3 (three) times daily as needed. 05/17/20  Yes [provider]    Physical Exam: Vitals:   06/24/20 1330 06/24/20 1421 06/24/20 1425 06/24/20 1500  BP: (!) 116/91 (!) 141/71  (!) 142/98  Pulse:  (!) 127 71 78   Resp: (!) 34 (!) 21 20 20   Temp:      TempSrc:      SpO2:  100% 100% 98%  Weight:      Height:        Constitutional: Still agitated, pulling against his restraints, attempting to get out of bed  Vitals:   06/24/20 1330 06/24/20 1421 06/24/20 1425 06/24/20 1500  BP: (!) 116/91 (!) 141/71  (!) 142/98  Pulse:  (!) 127 71 78  Resp: (!) 34 (!) 21 20 20   Temp:      TempSrc:      SpO2:  100% 100% 98%  Weight:      Height:       Eyes: Pupils constricted ~85mm, not reactive to light, equal bilaterally,  lids and conjunctivae normal ENMT: Mucous membranes are very dry, whitish substance coating his tongue cheek and teeth. Neck: normal, supple, no masses, no thyromegaly Respiratory: clear to auscultation bilaterally, no wheezing, no crackles. Normal respiratory effort. No accessory muscle use.  Cardiovascular: Tachycardic, regular rate and rhythm, no murmurs / rubs / gallops. No extremity edema. 2+ pedal pulses.   Abdomen: no tenderness, no masses palpated. No hepatosplenomegaly. Bowel sounds positive.  Musculoskeletal: no clubbing / cyanosis. No joint deformity upper and lower extremities. Good ROM, no contractures. Normal muscle tone.  Skin: no rashes, lesions, ulcers. No induration Neurologic: Moving extremities spontaneously, no apparent cranial abnormality. Psychiatric: Able to tell me his name, but he tells me he is in , talking continuously, but the rest of his speech incoherent.  Labs on Admission: I have personally reviewed following labs and imaging studies  CBC: Recent Labs  Lab 06/24/20 0922  WBC 9.9  NEUTROABS 6.6  HGB 16.4  HCT 48.1  MCV 96.0  PLT 295   Basic Metabolic Panel: Recent Labs  Lab 06/24/20 0922  NA 139  K 3.4*  CL 105  CO2 24  GLUCOSE 115*  BUN 15  CREATININE 0.87  CALCIUM 9.4   Liver Function Tests: Recent Labs  Lab 06/24/20 0922  AST 16  ALT 13  ALKPHOS 47  BILITOT 0.8  PROT 7.4  ALBUMIN 4.4    Urine analysis:     Component Value Date/Time   COLORURINE YELLOW 06/24/2020 1627   APPEARANCEUR CLEAR 06/24/2020 1627   LABSPEC 1.029 06/24/2020 1627   PHURINE 5.0 06/24/2020 1627   GLUCOSEU NEGATIVE 06/24/2020 1627   HGBUR NEGATIVE 06/24/2020 1627   BILIRUBINUR MODERATE (A) 06/24/2020 1627   KETONESUR NEGATIVE 06/24/2020 1627   PROTEINUR 30 (A) 06/24/2020 1627   UROBILINOGEN 0.2 12/24/2014 0805   NITRITE NEGATIVE 06/24/2020 1627   LEUKOCYTESUR NEGATIVE 06/24/2020 1627    Radiological Exams on Admission: No results found.  EKG: Independently reviewed.  Sinus tachycardia rate 115.  QTc 408.  No significant change from prior.  Assessment/Plan Principal Problem:   Substance withdrawal, with delirium ([facility]) Active Problems:   H/O: substance abuse ([facility])   GAD (generalized anxiety disorder)   Seizures ([facility])    Substance withdrawal with delirium-patient appears to be withdrawing from substance, likely benzodiazepines -with psychosis, autonomic dysfunction, tremors.  Patient reported he had not taken his clonazepam in 3 to 4 days.  Consider order synthetic illicit drugs.  Per home medication list he is on 1 mg 3 times daily  PRN.  Unremarkable blood alcohol level-doubt alcohol withdrawal.  UDS positive for benzos, amphetamines.  -Persistent agitation despite multiple sedative agents -Start Precedex -Continue CIWA protocol -1.5 L bolus given, continue D5 N/s 20 KCl at 100 cc/h x 1 day -Patient's mother request that she be called prior to patient being discharged-she does not think patient can return back to his previous living situation. -Consider TSS consult prior to discharge -When able to consider resuming Klonopin, at reduced dose - Obtain head ct - NPO for now  Seizures-no seizures no seizures in several years.  Not on medication.   DVT prophylaxis: lovenox Code Status: full code Family Communication: talked to patients mom on the phone, Andre Lynch- [phone]. All questions answered, plan  of care explained.  Disposition Plan: ~ 2 days Consults called: none Admission status: obs, step down I certify that at the point of admission it is my clinical judgment that the patient will require inpatient hospital care spanning beyond 2 midnights from the point of admission due to high intensity of service, high risk for further deterioration and high frequency of surveillance required.    Onnie Boer MD [facility]  06/24/2020, 5:36 PM

## 2020-06-24 NOTE — ED Notes (Signed)
Pt attempting to get out of bed. Pt has ripped his leads off. Pt unsteady on his feet. Pt fell into the stretcher, pt fell into the wall attempting to leave. Security and [employer] called to bedside. EDP made aware. IVC papers taken out by EDP. Pt voluntarily back to bed and changed into psych scrubs at this time.

## 2020-06-24 NOTE — ED Notes (Signed)
Attempting to get out the bed, pulling wires, saying he sees people that are not there, yelling in the hallway. MD notified, attempted to redirect

## 2020-06-24 NOTE — ED Notes (Signed)
Pt continues to get out the bed, remove heart monitor, remove BP cuff, asking for "some blues, some xanax"  Attempted to redirect  md aware

## 2020-06-24 NOTE — ED Triage Notes (Signed)
Pt presents to ED with complaints of feeling like his heart is racing since 0500 and has gradually gotten worse. Pt states he hasn't had his meds (klonopin and subutex) in 3-4 days.

## 2020-06-25 DIAGNOSIS — F13930 Sedative, hypnotic or anxiolytic use, unspecified with withdrawal, uncomplicated: Secondary | ICD-10-CM | POA: Insufficient documentation

## 2020-06-25 DIAGNOSIS — F13239 Sedative, hypnotic or anxiolytic dependence with withdrawal, unspecified: Secondary | ICD-10-CM | POA: Diagnosis present

## 2020-06-25 DIAGNOSIS — G894 Chronic pain syndrome: Secondary | ICD-10-CM | POA: Diagnosis present

## 2020-06-25 DIAGNOSIS — F1123 Opioid dependence with withdrawal: Secondary | ICD-10-CM | POA: Diagnosis present

## 2020-06-25 DIAGNOSIS — R451 Restlessness and agitation: Secondary | ICD-10-CM | POA: Diagnosis present

## 2020-06-25 DIAGNOSIS — G9340 Encephalopathy, unspecified: Secondary | ICD-10-CM

## 2020-06-25 DIAGNOSIS — Z20822 Contact with and (suspected) exposure to covid-19: Secondary | ICD-10-CM | POA: Diagnosis present

## 2020-06-25 DIAGNOSIS — F22 Delusional disorders: Secondary | ICD-10-CM | POA: Diagnosis not present

## 2020-06-25 DIAGNOSIS — F17210 Nicotine dependence, cigarettes, uncomplicated: Secondary | ICD-10-CM | POA: Diagnosis present

## 2020-06-25 DIAGNOSIS — R569 Unspecified convulsions: Secondary | ICD-10-CM | POA: Diagnosis not present

## 2020-06-25 DIAGNOSIS — G2581 Restless legs syndrome: Secondary | ICD-10-CM | POA: Diagnosis present

## 2020-06-25 DIAGNOSIS — E876 Hypokalemia: Secondary | ICD-10-CM | POA: Diagnosis present

## 2020-06-25 DIAGNOSIS — Z781 Physical restraint status: Secondary | ICD-10-CM | POA: Diagnosis not present

## 2020-06-25 DIAGNOSIS — G928 Other toxic encephalopathy: Secondary | ICD-10-CM | POA: Diagnosis present

## 2020-06-25 DIAGNOSIS — F13230 Sedative, hypnotic or anxiolytic dependence with withdrawal, uncomplicated: Secondary | ICD-10-CM

## 2020-06-25 DIAGNOSIS — F19231 Other psychoactive substance dependence with withdrawal delirium: Secondary | ICD-10-CM

## 2020-06-25 DIAGNOSIS — F1990 Other psychoactive substance use, unspecified, uncomplicated: Secondary | ICD-10-CM

## 2020-06-25 DIAGNOSIS — F1999 Other psychoactive substance use, unspecified with unspecified psychoactive substance-induced disorder: Secondary | ICD-10-CM | POA: Diagnosis not present

## 2020-06-25 DIAGNOSIS — F411 Generalized anxiety disorder: Secondary | ICD-10-CM | POA: Diagnosis present

## 2020-06-25 LAB — MAGNESIUM: Magnesium: 2.3 mg/dL (ref 1.7–2.4)

## 2020-06-25 LAB — HIV ANTIBODY (ROUTINE TESTING W REFLEX): HIV Screen 4th Generation wRfx: NONREACTIVE

## 2020-06-25 LAB — CBC
HCT: 44.4 % (ref 39.0–52.0)
Hemoglobin: 13.8 g/dL (ref 13.0–17.0)
MCH: 31.9 pg (ref 26.0–34.0)
MCHC: 31.1 g/dL (ref 30.0–36.0)
MCV: 102.5 fL — ABNORMAL HIGH (ref 80.0–100.0)
Platelets: 209 10*3/uL (ref 150–400)
RBC: 4.33 MIL/uL (ref 4.22–5.81)
RDW: 12.2 % (ref 11.5–15.5)
WBC: 8 10*3/uL (ref 4.0–10.5)
nRBC: 0 % (ref 0.0–0.2)

## 2020-06-25 LAB — BASIC METABOLIC PANEL
Anion gap: 8 (ref 5–15)
BUN: 18 mg/dL (ref 6–20)
CO2: 23 mmol/L (ref 22–32)
Calcium: 8.6 mg/dL — ABNORMAL LOW (ref 8.9–10.3)
Chloride: 114 mmol/L — ABNORMAL HIGH (ref 98–111)
Creatinine, Ser: 0.71 mg/dL (ref 0.61–1.24)
GFR, Estimated: >60 mL/min (ref >60)
Glucose, Bld: 94 mg/dL (ref 70–99)
Potassium: 3.9 mmol/L (ref 3.5–5.1)
Sodium: 145 mmol/L (ref 135–145)

## 2020-06-25 LAB — PHOSPHORUS: Phosphorus: 3.2 mg/dL (ref 2.5–4.6)

## 2020-06-25 LAB — MRSA PCR SCREENING: MRSA by PCR: NEGATIVE

## 2020-06-25 MED ORDER — BUPRENORPHINE HCL-NALOXONE HCL 8-2 MG SL SUBL
1.0000 | SUBLINGUAL_TABLET | Freq: Every day | SUBLINGUAL | Status: DC
  Filled 2020-06-25: qty 1

## 2020-06-25 MED ORDER — CHLORHEXIDINE GLUCONATE CLOTH 2 % EX PADS
6.0000 | MEDICATED_PAD | Freq: Every day | CUTANEOUS | Status: DC
  Administered 2020-06-25 – 2020-06-26 (×2): 6 via TOPICAL

## 2020-06-25 MED ORDER — BUPRENORPHINE HCL 2 MG SL SUBL
4.0000 mg | SUBLINGUAL_TABLET | Freq: Two times a day (BID) | SUBLINGUAL | Status: DC
  Administered 2020-06-25 – 2020-06-27 (×5): 4 mg via SUBLINGUAL
  Filled 2020-06-25 (×5): qty 2

## 2020-06-25 MED ORDER — DEXMEDETOMIDINE HCL IN NACL 400 MCG/100ML IV SOLN
0.4000 ug/kg/h | INTRAVENOUS | Status: DC
  Administered 2020-06-25: 0.4 ug/kg/h via INTRAVENOUS
  Administered 2020-06-26: 0.6 ug/kg/h via INTRAVENOUS
  Filled 2020-06-25 (×2): qty 100

## 2020-06-25 NOTE — Progress Notes (Signed)
PROGRESS NOTE  Andre Lynch TGY:[id number] DOB: 05-15-1981 DOA: 06/24/2020 PCP: [name], MD  Brief History:  40 year old male with a history of substance use disorder, tobacco abuse, seizures, restless leg syndrome presenting with confusion and agitation.  Apparently, the patient had complained of a sensation of his heart racing which caused him to come to the emergency department in the early morning on 06/24/2020.  During her stay in the emergency department, the patient was agitated and confused requiring 12 mg of Ativan IV, 200 mcg of fentanyl, and Geodon 20 mg IM to be given.  He was subsequently placed on a Precedex drip.  At that time, history was unobtainable.  At the time of my evaluation, the patient is more alert and awake, but remains somewhat tangential.  He states that he has run out of his Subutex and Klonopin both of which she has not taken for about 7 days.  He claims to have this refilled on a monthly basis by his PCP, Dr. [name].  When asked if he has been overusing causing him to run out of his medications, the patient is rather tangential.  He states that he did not have transportation to get to his PCP.  He denies any recent methamphetamine use although he states that he last used about 30 days prior to this admission.  He denies any alcohol or any other street drugs.  He denies any headache, chest pain, shortness breath, coughing, hemoptysis, nausea, vomiting, diarrhea, abdominal pain. In the emergency department, the patient was afebrile hemodynamically stable with oxygen saturation 100% room air.  BMP and CBC were unremarkable.  CT of the brain was negative.  UA was negative for any pyuria.  UDS was positive for amphetamines and benzodiazepines, but the patient had been given Ativan prior to his UDS.  Assessment/Plan: Acute toxic encephalopathy -Secondary to illicit substance use with a component of withdrawal -PDMP reviewed--patient has been receiving  buprenorphine 8 mg, last on 05/15/20,#65 and clonazepam 1 mg, #60 on 05/17/20 -Patient has been receiving this on a nearly monthly basis by one provider -UA negative for pyuria -TSH 1.690 -UDS positive for amphetamines and benzodiazepines -Alcohol level negative -Gradually wean off Precedex -Restart buprenorphine -Restart clonazepam as needed -will need TTS eval once medically stable  Hypokalemia -Repleted     Status is: Observation  The patient will require care spanning > 2 midnights and should be moved to inpatient because: Altered mental status  Dispo: The patient is from: Home              Anticipated d/c is to: Home              Anticipated d/c date is: 1 day              Patient currently is not medically stable to d/c.   Difficult to place patient Yes        Family Communication:  no Family at bedside  Consultants:  none  Code Status:  FULL  DVT Prophylaxis:  Hanna Lovenox   Procedures: As Listed in Progress Note Above  Antibiotics: None       Subjective: Patient denies fevers, chills, headache, chest pain, dyspnea, nausea, vomiting, diarrhea, abdominal pain, dysuria, hematuria, hematochezia, and melena.   Objective: Vitals:   06/25/20 0430 06/25/20 0600 06/25/20 0630 06/25/20 0700  BP: 102/71 92/66 99/74  116/64  Pulse: (!) 52 (!) 53 (!) 54 73  Resp: 11 13  11 14  Temp:      TempSrc:      SpO2: 98% 100% 100% 100%  Weight:      Height:        Intake/Output Summary (Last 24 hours) at 06/25/2020 0753 Last data filed at 06/24/2020 1135 Gross per 24 hour  Intake 1500 ml  Output -  Net 1500 ml   Weight change:  Exam:   General:  Pt is alert, follows commands appropriately, not in acute distress  HEENT: No icterus, No thrush, No neck mass, [place]/AT  Cardiovascular: RRR, S1/S2, no rubs, no gallops  Respiratory: CTA bilaterally, no wheezing, no crackles, no rhonchi  Abdomen: Soft/+BS, non tender, non distended, no guarding  Extremities:  No edema, No lymphangitis, No petechiae, No rashes, no synovitis   Data Reviewed: I have personally reviewed following labs and imaging studies Basic Metabolic Panel: Recent Labs  Lab 06/24/20 0922 06/25/20 0523  NA 139 145  K 3.4* 3.9  CL 105 114*  CO2 24 23  GLUCOSE 115* 94  BUN 15 18  CREATININE 0.87 0.71  CALCIUM 9.4 8.6*  MG  --  2.3  PHOS  --  3.2   Liver Function Tests: Recent Labs  Lab 06/24/20 0922  AST 16  ALT 13  ALKPHOS 47  BILITOT 0.8  PROT 7.4  ALBUMIN 4.4   No results for input(s): LIPASE, AMYLASE in the last 168 hours. No results for input(s): AMMONIA in the last 168 hours. Coagulation Profile: No results for input(s): INR, PROTIME in the last 168 hours. CBC: Recent Labs  Lab 06/24/20 0922 06/25/20 0523  WBC 9.9 8.0  NEUTROABS 6.6  --   HGB 16.4 13.8  HCT 48.1 44.4  MCV 96.0 102.5*  PLT 295 209   Cardiac Enzymes: No results for input(s): CKTOTAL, CKMB, CKMBINDEX, TROPONINI in the last 168 hours. BNP: Invalid input(s): POCBNP CBG: No results for input(s): GLUCAP in the last 168 hours. HbA1C: No results for input(s): HGBA1C in the last 72 hours. Urine analysis:    Component Value Date/Time   COLORURINE YELLOW 06/24/2020 1627   APPEARANCEUR CLEAR 06/24/2020 1627   LABSPEC 1.029 06/24/2020 1627   PHURINE 5.0 06/24/2020 1627   GLUCOSEU NEGATIVE 06/24/2020 1627   HGBUR NEGATIVE 06/24/2020 1627   BILIRUBINUR MODERATE (A) 06/24/2020 1627   KETONESUR NEGATIVE 06/24/2020 1627   PROTEINUR 30 (A) 06/24/2020 1627   UROBILINOGEN 0.2 12/24/2014 0805   NITRITE NEGATIVE 06/24/2020 1627   LEUKOCYTESUR NEGATIVE 06/24/2020 1627   Sepsis Labs: @LABRCNTIP (procalcitonin:4,lacticidven:4) ) Recent Results (from the past 240 hour(s))  SARS CORONAVIRUS 2 (Janira Mandell 6-24 HRS) Nasopharyngeal Nasopharyngeal Swab     Status: None   Collection Time: 06/24/20 10:56 AM   Specimen: Nasopharyngeal Swab  Result Value Ref Range Status   SARS Coronavirus 2 NEGATIVE  NEGATIVE Final    Comment: (NOTE) SARS-CoV-2 target nucleic acids are NOT DETECTED.  The SARS-CoV-2 RNA is generally detectable in upper and lower respiratory specimens during the acute phase of infection. Negative results do not preclude SARS-CoV-2 infection, do not rule out co-infections with other pathogens, and should not be used as the sole basis for treatment or other patient management decisions. Negative results must be combined with clinical observations, patient history, and epidemiological information. The expected result is Negative.  Fact Sheet for Patients: 06/26/20  Fact Sheet for Healthcare Providers: HairSlick.no  This test is not yet approved or cleared by the quierodirigir.com FDA and  has been authorized for detection and/or diagnosis of  SARS-CoV-2 by FDA under an Emergency Use Authorization (EUA). This EUA will remain  in effect (meaning this test can be used) for the duration of the COVID-19 declaration under Se ction 564(b)(1) of the Act, 21 U.S.C. section 360bbb-3(b)(1), unless the authorization is terminated or revoked sooner.  Performed at [facility], [address], [place], [place] [address]      Scheduled Meds: . enoxaparin (LOVENOX) injection  40 mg Subcutaneous QHS  . folic acid  1 mg Oral Daily  . LORazepam  0-4 mg Intravenous Q6H   Followed by  . [START ON 06/26/2020] LORazepam  0-4 mg Intravenous Q12H  . multivitamin with minerals  1 tablet Oral Daily  . thiamine  100 mg Oral Daily   Or  . thiamine  100 mg Intravenous Daily   Continuous Infusions: . 0.9 % NaCl with KCl 20 mEq / L Stopped (06/24/20 2306)  . dexmedetomidine (PRECEDEX) IV infusion 0.5 mcg/kg/hr (06/25/20 0700)  . dextrose 5 % and 0.9 % NaCl with KCl 20 mEq/L 100 mL/hr at 06/25/20 0388    Procedures/Studies: CT HEAD WO CONTRAST  Result Date: 06/24/2020 CLINICAL DATA:  40 year old male with altered mental  status. EXAM: CT HEAD WITHOUT CONTRAST TECHNIQUE: Contiguous axial images were obtained from the base of the skull through the vertex without intravenous contrast. COMPARISON:  Head CT dated 01/22/2014. FINDINGS: Brain: The ventricles and sulci appropriate size for patient's age. The gray-white matter discrimination is preserved. There is no acute intracranial hemorrhage. No mass effect or midline shift. No extra-axial fluid collection. Vascular: No hyperdense vessel or unexpected calcification. Skull: Normal. Negative for fracture or focal lesion. Sinuses/Orbits: No acute finding. Other: None IMPRESSION: Unremarkable noncontrast CT of the brain. Electronically Signed   By: Elgie Collard M.D.   On: 06/24/2020 23:01    Catarina Hartshorn, DO  [facility]  If 7PM-7AM, please contact night-coverage [email or web address] Password [email or web address] 06/25/2020, 7:53 AM   LOS: 51 days  40 year old male with a history of substance use disorder, seizures, restless leg syndrome

## 2020-06-25 NOTE — ED Notes (Signed)
Dr. Arbutus Leas notified of pt's HR dipping down into the 40's, but mainly staying the 50's.

## 2020-06-26 DIAGNOSIS — R569 Unspecified convulsions: Secondary | ICD-10-CM

## 2020-06-26 MED ORDER — MELATONIN 3 MG PO TABS
6.0000 mg | ORAL_TABLET | Freq: Once | ORAL | Status: AC
  Administered 2020-06-26: 6 mg via ORAL
  Filled 2020-06-26: qty 2

## 2020-06-26 MED ORDER — CLONAZEPAM 0.5 MG PO TABS
1.0000 mg | ORAL_TABLET | Freq: Two times a day (BID) | ORAL | Status: DC
  Administered 2020-06-26: 1 mg via ORAL
  Filled 2020-06-26: qty 2

## 2020-06-26 MED ORDER — NICOTINE 21 MG/24HR TD PT24: 21.0000 mg | MEDICATED_PATCH | Freq: Every day | TRANSDERMAL | Status: DC

## 2020-06-26 MED ORDER — NICOTINE 14 MG/24HR TD PT24
14.0000 mg | MEDICATED_PATCH | Freq: Every day | TRANSDERMAL | Status: DC
  Administered 2020-06-26 – 2020-06-27 (×2): 14 mg via TRANSDERMAL
  Filled 2020-06-26 (×2): qty 1

## 2020-06-26 MED ORDER — CLONAZEPAM 0.5 MG PO TABS
1.0000 mg | ORAL_TABLET | Freq: Three times a day (TID) | ORAL | Status: DC
  Administered 2020-06-26 – 2020-06-27 (×3): 1 mg via ORAL
  Filled 2020-06-26 (×3): qty 2

## 2020-06-26 NOTE — Progress Notes (Signed)
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

## 2020-06-26 NOTE — Progress Notes (Signed)
Assumed care of patient this AM. Patient resting in bed on DEX gtt with sitter at bedside. Patient is calm, cooperative with cares. Vitals stable.

## 2020-06-26 NOTE — Progress Notes (Signed)
PROGRESS NOTE  Andre Lynch VFI:[id number] DOB: 07-May-1981 DOA: 06/24/2020 PCP: [name], MD  Brief History:  40 year old male with a history of substance use disorder, tobacco abuse, seizures, restless leg syndrome presenting with confusion and agitation.  Apparently, the patient had complained of a sensation of his heart racing which caused him to come to the emergency department in the early morning on 06/24/2020.  During her stay in the emergency department, the patient was agitated and confused requiring 12 mg of Ativan IV, 200 mcg of fentanyl, and Geodon 20 mg IM to be given.  He was subsequently placed on a Precedex drip.  At that time, history was unobtainable.  At the time of my evaluation, the patient is more alert and awake, but remains somewhat tangential.  He states that he has run out of his Subutex and Klonopin both of which she has not taken for about 7 days.  He claims to have this refilled on a monthly basis by his PCP, Dr. [name].  When asked if he has been overusing causing him to run out of his medications, the patient is rather tangential.  He states that he did not have transportation to get to his PCP.  He denies any recent methamphetamine use although he states that he last used about 30 days prior to this admission.  He denies any alcohol or any other street drugs.  He denies any headache, chest pain, shortness breath, coughing, hemoptysis, nausea, vomiting, diarrhea, abdominal pain. In the emergency department, the patient was afebrile hemodynamically stable with oxygen saturation 100% room air.  BMP and CBC were unremarkable.  CT of the brain was negative.  UA was negative for any pyuria.  UDS was positive for amphetamines and benzodiazepines, but the patient had been given Ativan prior to his UDS.  Assessment/Plan: Acute toxic encephalopathy -Secondary to illicit substance use with a component of withdrawal -PDMP reviewed--patient has been receiving  buprenorphine 8 mg, last on 05/15/20,#65 and clonazepam 1 mg, #60 on 05/17/20 -Patient has been receiving this on a nearly monthly basis by one provider -UA negative for pyuria -TSH 1.690 -UDS positive for amphetamines and benzodiazepines -Alcohol level negative -wean off Precedex-->wean off today and move out of ICU -Restart buprenorphine -Restart clonazepam as needed -TTS eval once medically stable after weaned off Precedex -06/26/20--more lucid today  Hypokalemia -Repleted     Status is: Observation  The patient will require care spanning > 2 midnights and should be moved to inpatient because: Altered mental status  Dispo: The patient is from: Home  Anticipated d/c is to: Home  Anticipated d/c date is: 1 day  Patient currently is not medically stable to d/c.              Difficult to place patient Yes        Family Communication:  no Family at bedside  Consultants:  none  Code Status:  FULL  DVT Prophylaxis:  Jacobus Lovenox   Procedures: As Listed in Progress Note Above  Antibiotics: None      Subjective: Patient denies fevers, chills, headache, chest pain, dyspnea, nausea, vomiting, diarrhea, abdominal pain, dysuria, hematuria, hematochezia, and melena.   Objective: Vitals:   06/26/20 0500 06/26/20 0515 06/26/20 0530 06/26/20 0545  BP: 107/64 106/70 114/72 116/77  Pulse: (!) 52 (!) 54 (!) 56 81  Resp: 12 11 12 18   Temp:      TempSrc:  SpO2: 100% 95% 99% 100%  Weight:      Height:        Intake/Output Summary (Last 24 hours) at 06/26/2020 0852 Last data filed at 06/26/2020 0654 Gross per 24 hour  Intake 1618.16 ml  Output 1000 ml  Net 618.16 ml   Weight change: -6.571 kg Exam:   General:  Pt is alert, follows commands appropriately, not in acute distress  HEENT: No icterus, No thrush, No neck mass, /AT  Cardiovascular: RRR, S1/S2, no rubs, no gallops  Respiratory:  bibasilar rales. No wheeze  Abdomen: Soft/+BS, non tender, non distended, no guarding  Extremities: No edema, No lymphangitis, No petechiae, No rashes, no synovitis   Data Reviewed: I have personally reviewed following labs and imaging studies Basic Metabolic Panel: Recent Labs  Lab 06/24/20 0922 06/25/20 0523  NA 139 145  K 3.4* 3.9  CL 105 114*  CO2 24 23  GLUCOSE 115* 94  BUN 15 18  CREATININE 0.87 0.71  CALCIUM 9.4 8.6*  MG  --  2.3  PHOS  --  3.2   Liver Function Tests: Recent Labs  Lab 06/24/20 0922  AST 16  ALT 13  ALKPHOS 47  BILITOT 0.8  PROT 7.4  ALBUMIN 4.4   No results for input(s): LIPASE, AMYLASE in the last 168 hours. No results for input(s): AMMONIA in the last 168 hours. Coagulation Profile: No results for input(s): INR, PROTIME in the last 168 hours. CBC: Recent Labs  Lab 06/24/20 0922 06/25/20 0523  WBC 9.9 8.0  NEUTROABS 6.6  --   HGB 16.4 13.8  HCT 48.1 44.4  MCV 96.0 102.5*  PLT 295 209   Cardiac Enzymes: No results for input(s): CKTOTAL, CKMB, CKMBINDEX, TROPONINI in the last 168 hours. BNP: Invalid input(s): POCBNP CBG: No results for input(s): GLUCAP in the last 168 hours. HbA1C: No results for input(s): HGBA1C in the last 72 hours. Urine analysis:    Component Value Date/Time   COLORURINE YELLOW 06/24/2020 1627   APPEARANCEUR CLEAR 06/24/2020 1627   LABSPEC 1.029 06/24/2020 1627   PHURINE 5.0 06/24/2020 1627   GLUCOSEU NEGATIVE 06/24/2020 1627   HGBUR NEGATIVE 06/24/2020 1627   BILIRUBINUR MODERATE (A) 06/24/2020 1627   KETONESUR NEGATIVE 06/24/2020 1627   PROTEINUR 30 (A) 06/24/2020 1627   UROBILINOGEN 0.2 12/24/2014 0805   NITRITE NEGATIVE 06/24/2020 1627   LEUKOCYTESUR NEGATIVE 06/24/2020 1627   Sepsis Labs: @LABRCNTIP (procalcitonin:4,lacticidven:4) ) Recent Results (from the past 240 hour(s))  SARS CORONAVIRUS 2 (Johanan Skorupski 6-24 HRS) Nasopharyngeal Nasopharyngeal Swab     Status: None   Collection Time: 06/24/20  10:56 AM   Specimen: Nasopharyngeal Swab  Result Value Ref Range Status   SARS Coronavirus 2 NEGATIVE NEGATIVE Final    Comment: (NOTE) SARS-CoV-2 target nucleic acids are NOT DETECTED.  The SARS-CoV-2 RNA is generally detectable in upper and lower respiratory specimens during the acute phase of infection. Negative results do not preclude SARS-CoV-2 infection, do not rule out co-infections with other pathogens, and should not be used as the sole basis for treatment or other patient management decisions. Negative results must be combined with clinical observations, patient history, and epidemiological information. The expected result is Negative.  Fact Sheet for Patients: 06/26/20  Fact Sheet for Healthcare Providers: HairSlick.no  This test is not yet approved or cleared by the [employer]ir[employer]ir.com FDA and  has been authorized for detection and/or diagnosis of SARS-CoV-2 by FDA under an Emergency Use Authorization (EUA). This EUA will remain  in effect (meaning  this test can be used) for the duration of the COVID-19 declaration under Se ction 564(b)(1) of the Act, 21 U.S.C. section 360bbb-3(b)(1), unless the authorization is terminated or revoked sooner.  Performed at [facility], [address] [place], [place] [address]   MRSA PCR Screening     Status: None   Collection Time: 06/25/20 12:30 PM   Specimen: Nasopharyngeal  Result Value Ref Range Status   MRSA by PCR NEGATIVE NEGATIVE Final    Comment:        The GeneXpert MRSA Assay (FDA approved for NASAL specimens only), is one component of a comprehensive MRSA colonization surveillance program. It is not intended to diagnose MRSA infection nor to guide or monitor treatment for MRSA infections. Performed at [facility], [address] [place], [place] [address]      Scheduled Meds: . buprenorphine  4 mg Sublingual BID  . Chlorhexidine Gluconate  Cloth  6 each Topical Q0600  . clonazePAM  1 mg Oral BID  . enoxaparin (LOVENOX) injection  40 mg Subcutaneous QHS  . folic acid  1 mg Oral Daily  . LORazepam  0-4 mg Intravenous Q6H   Followed by  . LORazepam  0-4 mg Intravenous Q12H  . multivitamin with minerals  1 tablet Oral Daily  . thiamine  100 mg Oral Daily   Or  . thiamine  100 mg Intravenous Daily   Continuous Infusions: . dexmedetomidine (PRECEDEX) IV infusion 0.7 mcg/kg/hr (06/26/20 0445)    Procedures/Studies: CT HEAD WO CONTRAST  Result Date: 06/24/2020 CLINICAL DATA:  40 year old male with altered mental status. EXAM: CT HEAD WITHOUT CONTRAST TECHNIQUE: Contiguous axial images were obtained from the base of the skull through the vertex without intravenous contrast. COMPARISON:  Head CT dated 01/22/2014. FINDINGS: Brain: The ventricles and sulci appropriate size for patient's age. The gray-white matter discrimination is preserved. There is no acute intracranial hemorrhage. No mass effect or midline shift. No extra-axial fluid collection. Vascular: No hyperdense vessel or unexpected calcification. Skull: Normal. Negative for fracture or focal lesion. Sinuses/Orbits: No acute finding. Other: None IMPRESSION: Unremarkable noncontrast CT of the brain. Electronically Signed   By: Elgie Collard M.D.   On: 06/24/2020 23:01    Catarina Hartshorn, DO  [facility]  If 7PM-7AM, please contact night-coverage [email or web address] Password [email or web address] 06/26/2020, 8:52 AM   LOS: 1 day

## 2020-06-27 DIAGNOSIS — F1999 Other psychoactive substance use, unspecified with unspecified psychoactive substance-induced disorder: Secondary | ICD-10-CM | POA: Diagnosis present

## 2020-06-27 DIAGNOSIS — F99 Mental disorder, not otherwise specified: Secondary | ICD-10-CM

## 2020-06-27 DIAGNOSIS — F22 Delusional disorders: Secondary | ICD-10-CM

## 2020-06-27 LAB — CBC
HCT: 42.3 % (ref 39.0–52.0)
Hemoglobin: 13.9 g/dL (ref 13.0–17.0)
MCH: 32.4 pg (ref 26.0–34.0)
MCHC: 32.9 g/dL (ref 30.0–36.0)
MCV: 98.6 fL (ref 80.0–100.0)
Platelets: 216 10*3/uL (ref 150–400)
RBC: 4.29 MIL/uL (ref 4.22–5.81)
RDW: 12.3 % (ref 11.5–15.5)
WBC: 6.7 10*3/uL (ref 4.0–10.5)
nRBC: 0 % (ref 0.0–0.2)

## 2020-06-27 LAB — BASIC METABOLIC PANEL
Anion gap: 6 (ref 5–15)
BUN: 7 mg/dL (ref 6–20)
CO2: 27 mmol/L (ref 22–32)
Calcium: 8.4 mg/dL — ABNORMAL LOW (ref 8.9–10.3)
Chloride: 104 mmol/L (ref 98–111)
Creatinine, Ser: 0.51 mg/dL — ABNORMAL LOW (ref 0.61–1.24)
GFR, Estimated: >60 mL/min (ref >60)
Glucose, Bld: 89 mg/dL (ref 70–99)
Potassium: 3.4 mmol/L — ABNORMAL LOW (ref 3.5–5.1)
Sodium: 137 mmol/L (ref 135–145)

## 2020-06-27 LAB — MAGNESIUM: Magnesium: 1.7 mg/dL (ref 1.7–2.4)

## 2020-06-27 MED ORDER — POTASSIUM CHLORIDE CRYS ER 20 MEQ PO TBCR: 20.0000 meq | EXTENDED_RELEASE_TABLET | Freq: Once | ORAL | Status: DC

## 2020-06-27 NOTE — Consult Note (Signed)
Telepsych Consultation   Reason for Consult:  Psych consult; Delusional behavior; substance use disorder with associated psychosis; currently IVCed Referring Physician: Catarina Hartshorn, MD Location of Patient: [facility]  Location of Provider: Behavioral Health TTS Department  Patient Identification: Andre Lynch MRN:  [id number] Principal Diagnosis: Substance-induced disorder [facility]) Diagnosis:  Principal Problem:   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HCC) Active Problems:   H/O: substance abuse (HCC)   GAD (generalized anxiety disorder)   Substance withdrawal, with delirium (HCC)   Seizures (HCC)   Acute encephalopathy   Substance use disorder   Chronic pain syndrome   Total Time spent with patient: 20 minutes  Subjective:   Andre Lynch is a 40 y.o. male patient admitted with complaints of "heart racing" presenting with agitation and confusion.  Patient present sitting up in bed; calm and cooperative. Alert and oriented. Engaged in assessment.   Patient states "mainly my anxiety. I was seeing a doctor to get my Subutex and it became a hassle because I don't have a car. So he referred me to y'all". Patient denies any recent substance use despite UDS positive for amphetamine. Patient states he is interested in outpatient services in the [place] area.   Patient denies any suicidal or homicidal ideations, visual hallucinations and does not appear to be responding to any external/internal stimuli at this time. Patient does endorses chronic auditory hallucinations that he describes as "voices" he talks to; denies any commands or ideas of reference at this time.   Collateral: Wynonia Sours [phone]- mother Mom states she is "definitely concerned. He's been living at my house and I've seen his situation deteriorate over the past two years. I don't know what he's taking but it's taking control over him physically and mentally. He is usually an even keel person; now he snaps at you when you try to  talk to him. That's my primary residence where he lives and I had to leave because he has inhabited my living room. He needs a long-term program. He's in the 30 day programs in [place], [place]. Through the court system he did 2 CRV's at [place] and one in [place] because he failed drug test because he was on probation. He came back after 90 days and relapsed. He can't keep a job because he's goes overboard on substances. He has a real problem with Xanax. The doctor stopped prescribing him Xanax and now he's taking those Klonipin; I don't think he's taking them right. I've seen a glass thing, a pipe in the house". Mom states patient is unable to return to her home and would like patient sent to inpatient rehab program. Mom denies any concerns for patient being suicidal or homicidal; mom does express concerns regarding patient's substance abuse and would like for him to seek treatment. Discussed inpatient criteria; mom verbalized an understanding that patient does not currently meet inpatient criteria and would be provided with outpatient resources for follow-up.   HPI:   Andre Lynch is a 40 year old male admitted to [facility] with complaints of "heart racing" presenting acutely agitated and confused requiring 12mg  Ativan IV, 200 mcg of fentanyl, and 20mg  Geodon IM in the ED. Patient has extensive history of substance abuse; currently taking Subutex and Klonipin from local provider. Per PDMP pt receiving Bupenorphrin and Clonazepam; seen twice 05/07/20, 05/14/20. Patient currently lives with his mother in [place]; per her report patient has increased dependency and use over the past 2 years. Patient is currently unemployed.   Past Psychiatric History:  -polysubstance  abuse  Risk to Self:  patient denies Risk to Others:  patient denies Prior Inpatient Therapy:  yes Prior Outpatient Therapy:  yes  Past Medical History:  Past Medical History:  Diagnosis Date  . Anxiety   . Back pain   . Chronic knee pain    . Depression   . Gastric ulcer   . History of narcotic addiction (HCC)    methadone tx  . Kidney stones   . RLS (restless legs syndrome)   . Seizures (HCC)    last one early 2018  . Substance abuse (HCC)    cocaine, benzos, marijuana, benzos, opiates    Past Surgical History:  Procedure Laterality Date  . HERNIA REPAIR    . WISDOM TOOTH EXTRACTION     Family History:  Family History  Problem Relation Age of Onset  . Cancer Mother        breast cancer survivor   Family Psychiatric  History: not noted Social History:  Social History   Substance and Sexual Activity  Alcohol Use Not Currently     Social History   Substance and Sexual Activity  Drug Use Not Currently    Social History   Socioeconomic History  . Marital status: Single    Spouse name: Not on file  . Number of children: Not on file  . Years of education: Not on file  . Highest education level: Not on file  Occupational History  . Occupation: unemployed  Tobacco Use  . Smoking status: Current Every Day Smoker    Packs/day: 0.50    Years: 20.00    Pack years: 10.00    Types: Cigarettes  . Smokeless tobacco: Never Used  Substance and Sexual Activity  . Alcohol use: Not Currently  . Drug use: Not Currently  . Sexual activity: Not Currently  Other Topics Concern  . Not on file  Social History Narrative  . Not on file   Social Determinants of Health   Financial Resource Strain: Not on file  Food Insecurity: Not on file  Transportation Needs: Not on file  Physical Activity: Not on file  Stress: Not on file  Social Connections: Not on file   Additional Social History:    Allergies:   Allergies  Allergen Reactions  . Ibuprofen Other (See Comments)    Cannot take due to stomach ulcers  . Tylenol [Acetaminophen] Other (See Comments)    Cannot take due to stomach ulcers    Labs:  Results for orders placed or performed during the hospital encounter of 06/24/20 (from the past 48 hour(s))   MRSA PCR Screening     Status: None   Collection Time: 06/25/20 12:30 PM   Specimen: Nasopharyngeal  Result Value Ref Range   MRSA by PCR NEGATIVE NEGATIVE    Comment:        The GeneXpert MRSA Assay (FDA approved for NASAL specimens only), is one component of a comprehensive MRSA colonization surveillance program. It is not intended to diagnose MRSA infection nor to guide or monitor treatment for MRSA infections. Performed at [facility], [address], [place], [place] [address]   Basic metabolic panel     Status: Abnormal   Collection Time: 06/27/20  3:54 AM  Result Value Ref Range   Sodium 137 135 - 145 mmol/L    Comment: DELTA CHECK NOTED   Potassium 3.4 (L) 3.5 - 5.1 mmol/L   Chloride 104 98 - 111 mmol/L   CO2 27 22 - 32 mmol/L  Glucose, Bld 89 70 - 99 mg/dL    Comment: Glucose reference range applies only to samples taken after fasting for at least 8 hours.   BUN 7 6 - 20 mg/dL   Creatinine, Ser 6.97 (L) 0.61 - 1.24 mg/dL   Calcium 8.4 (L) 8.9 - 10.3 mg/dL   GFR, Estimated >94 >80 mL/min    Comment: (NOTE) Calculated using the CKD-EPI Creatinine Equation (2021)    Anion gap 6 5 - 15    Comment: Performed at [facility], [address], [place], [place] [address]  CBC     Status: None   Collection Time: 06/27/20  3:54 AM  Result Value Ref Range   WBC 6.7 4.0 - 10.5 K/uL   RBC 4.29 4.22 - 5.81 MIL/uL   Hemoglobin 13.9 13.0 - 17.0 g/dL   HCT 74.8 27.0 - 78.6 %   MCV 98.6 80.0 - 100.0 fL   MCH 32.4 26.0 - 34.0 pg   MCHC 32.9 30.0 - 36.0 g/dL   RDW 75.4 49.2 - 01.0 %   Platelets 216 150 - 400 K/uL   nRBC 0.0 0.0 - 0.2 %    Comment: Performed at [facility], [address], [place], [place] [address]  Magnesium     Status: None   Collection Time: 06/27/20  3:54 AM  Result Value Ref Range   Magnesium 1.7 1.7 - 2.4 mg/dL    Comment: Performed at [facility], [address], [place], [place] [address]    Medications:  Current Facility-Administered  Medications  Medication Dose Route Frequency Provider Last Rate Last Admin  . acetaminophen (TYLENOL) tablet 650 mg  650 mg Oral Q6H PRN Tat, David, MD       Or  . acetaminophen (TYLENOL) suppository 650 mg  650 mg Rectal Q6H PRN Tat, Onalee Hua, MD      . buprenorphine (SUBUTEX) SL tablet 4 mg  4 mg Sublingual BID Tat, David, MD   4 mg at 06/27/20 1009  . Chlorhexidine Gluconate Cloth 2 % PADS 6 each  6 each Topical Q0600 Tat, Onalee Hua, MD   6 each at 06/26/20 0603  . clonazePAM (KLONOPIN) tablet 1 mg  1 mg Oral TID Catarina Hartshorn, MD   1 mg at 06/27/20 1009  . dexmedetomidine (PRECEDEX) 400 MCG/100ML (4 mcg/mL) infusion  0.4-1.2 mcg/kg/hr Intravenous Titrated Catarina Hartshorn, MD   Stopped at 06/26/20 1443  . enoxaparin (LOVENOX) injection 40 mg  40 mg Subcutaneous QHS Tat, David, MD      . folic acid (FOLVITE) tablet 1 mg  1 mg Oral Daily Tat, David, MD   1 mg at 06/27/20 1009  . LORazepam (ATIVAN) injection 0-4 mg  0-4 mg Intravenous Pablo Ledger, MD   2 mg at 06/27/20 1005  . LORazepam (ATIVAN) tablet 1-4 mg  1-4 mg Oral Q1H PRN Catarina Hartshorn, MD   1 mg at 06/27/20 3254   Or  . LORazepam (ATIVAN) injection 1-4 mg  1-4 mg Intravenous Q1H PRN Catarina Hartshorn, MD   2 mg at 06/26/20 1451  . multivitamin with minerals tablet 1 tablet  1 tablet Oral Daily Tat, David, MD   1 tablet at 06/27/20 1007  . nicotine (NICODERM CQ - dosed in mg/24 hours) patch 14 mg  14 mg Transdermal Daily Tat, David, MD   14 mg at 06/27/20 1006  . ondansetron (ZOFRAN) tablet 4 mg  4 mg Oral Q6H PRN Catarina Hartshorn, MD       Or  .  ondansetron (ZOFRAN) injection 4 mg  4 mg Intravenous Q6H PRN Tat, David, MD      . thiamine tablet 100 mg  100 mg Oral Daily Tat, David, MD   100 mg at 06/27/20 1009   Or  . thiamine (B-1) injection 100 mg  100 mg Intravenous Daily Tat, Onalee Hua, MD        Musculoskeletal: Strength & Muscle Tone: within normal limits Gait & Station: normal Patient leans: N/A  Psychiatric Specialty Exam: Physical Exam Vitals and  nursing note reviewed.  Psychiatric:        Attention and Perception: Attention and perception normal.        Mood and Affect: Mood and affect normal.        Speech: Speech normal.        Behavior: Behavior normal. Behavior is cooperative.        Thought Content: Thought content normal.        Cognition and Memory: Cognition and memory normal.     Review of Systems  Blood pressure (!) 133/99, pulse (!) 120, temperature 98.2 F (36.8 C), temperature source Oral, resp. rate 20, height 5\' 9"  (1.753 m), weight 59.2 kg, SpO2 98 %.Body mass index is 19.27 kg/m.  General Appearance: Fairly Groomed  Eye Contact:  Fair  Speech:  Clear and Coherent  Volume:  Normal  Mood:  Euthymic  Affect:  Congruent  Thought Process:  Coherent  Orientation:  Full (Time, Place, and Person)  Thought Content:  Logical  Suicidal Thoughts:  No  Homicidal Thoughts:  No  Memory:  Immediate;   Fair Recent;   Fair  Judgement:  Fair  Insight:  Lacking  Psychomotor Activity:  Normal  Concentration:  Concentration: Fair and Attention Span: Fair  Recall:  [employer] of Knowledge:  Fair  Language:  Fair  Akathisia:  NA  Handed:  Left  AIMS (if indicated):     Assets:  Communication Skills Physical Health Resilience Social Support  ADL's:  Intact  Cognition:  WNL  Sleep:      Treatment Plan Summary: Plan to discharge with outpatient resources. SW consult for local psychiatric and substance abuse outpatient resources. Shelter program resources.   Disposition: No evidence of imminent risk to self or others at present.   Patient does not meet criteria for psychiatric inpatient admission. Supportive therapy provided about ongoing stressors. Discussed crisis plan, support from social network, calling 911, coming to the Emergency Department, and calling Suicide Hotline.  This service was provided via telemedicine using a 2-way, interactive audio and video technology.  Names of all persons participating in  this telemedicine service and their role in this encounter. Name: Maxie Barb Role: PMHNP  Name: Nelly Rout Role: Attending MD  Name: Andre Lynch Role: patient  Name: Wynonia Sours Role: mother    Loletta Parish, NP 06/27/2020 10:28 AM
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
## 2020-06-27 NOTE — Discharge Summary (Signed)
Physician Discharge Summary  EVEREST BROD RXV:[id number] DOB: 10-08-1980 DOA: 06/24/2020  PCP: [name], MD  Admit date: 06/24/2020 Discharge date: 06/27/2020  Admitted From: Home Disposition:  Home   Recommendations for Outpatient Follow-up:  1. Follow up with PCP in 1-2 weeks 2. Please obtain BMP/CBC in one week     Discharge Condition: Stable CODE STATUS: FULL Diet recommendation: Regular   Brief/Interim Summary: 40 year old male with a history of substance use disorder, tobacco abuse, seizures, restless leg syndrome presenting with confusion and agitation. Apparently, the patient had complained of a sensation of his heart racing which caused him to come to the emergency department in the early morning on 06/24/2020. During her stay in the emergency department, the patient was agitated and confused requiring 12 mg of Ativan IV, 200 mcg of fentanyl, and Geodon 20 mg IM to be given. He was subsequently placed on a Precedex drip. At that time, history was unobtainable. At the time of my evaluation, the patient is more alert and awake, but remains somewhat tangential. He states that he has run out of his Subutex and Klonopin both of which she has not taken for about 7 days. He claims to have this refilled on a monthly basis by his PCP, Dr. [name].When asked if he has been overusing causing him to run out of his medications, the patient is rather tangential. He states that he did not have transportation to get to his PCP. He denies any recent methamphetamine use although he states that he last used about 30 days prior to this admission. He denies any alcohol or any other street drugs. He denies any headache, chest pain, shortness breath, coughing, hemoptysis, nausea, vomiting, diarrhea, abdominal pain. In the emergency department, the patient was afebrile hemodynamically stable with oxygen saturation 100% room air. BMP and CBC were unremarkable. CT of the brain was  negative. UA was negative for any pyuria. UDS was positive for amphetamines and benzodiazepines,but the patient had been given Ativan prior to his UDS.   Discharge Diagnoses:  Acute toxic encephalopathy -Secondary to illicit substance (methamphetamine) use with a component of withdrawal -although patient denied meth use, his use was validated by his mother -PDMPreviewed--patient has been receiving buprenorphine 8 mg, last on 05/15/20,#65 andclonazepam 1 mg, #60 on 05/17/20 -Patient has been receiving this on a nearly monthly basis by one provider -UA negative for pyuria -TSH 1.690 -UDS positive for amphetamines and benzodiazepines -Alcohol level negative -wean off Precedex-->wean off today and move out of ICU -Restart buprenorphine -Restart clonazepam  -TTS eval once medically stable after weaned off Precedex -06/26/20--more lucid today -06/27/20--lucid, A&O x 4 and medically stable -seen by telepsychiatry whom cleared patient for discharge  Hypokalemia -Repleted -mag 1.7   Discharge Instructions   Allergies as of 06/27/2020      Reactions   Ibuprofen Other (See Comments)   Cannot take due to stomach ulcers   Tylenol [acetaminophen] Other (See Comments)   Cannot take due to stomach ulcers      Medication List    TAKE these medications   buprenorphine 8 MG Subl SL tablet Commonly known as: SUBUTEX 4 mg 2 (two) times a day.   clonazePAM 1 MG tablet Commonly known as: KLONOPIN Take 1 mg by mouth 3 (three) times daily as needed.       Allergies  Allergen Reactions  . Ibuprofen Other (See Comments)    Cannot take due to stomach ulcers  . Tylenol [Acetaminophen] Other (See Comments)  Cannot take due to stomach ulcers    Consultations:  telepsychiatry   Procedures/Studies: CT HEAD WO CONTRAST  Result Date: 06/24/2020 CLINICAL DATA:  40 year old male with altered mental status. EXAM: CT HEAD WITHOUT CONTRAST TECHNIQUE: Contiguous axial images were  obtained from the base of the skull through the vertex without intravenous contrast. COMPARISON:  Head CT dated 01/22/2014. FINDINGS: Brain: The ventricles and sulci appropriate size for patient's age. The gray-white matter discrimination is preserved. There is no acute intracranial hemorrhage. No mass effect or midline shift. No extra-axial fluid collection. Vascular: No hyperdense vessel or unexpected calcification. Skull: Normal. Negative for fracture or focal lesion. Sinuses/Orbits: No acute finding. Other: None IMPRESSION: Unremarkable noncontrast CT of the brain. Electronically Signed   By: Elgie Collard M.D.   On: 06/24/2020 23:01        Discharge Exam: Vitals:   06/27/20 0603 06/27/20 1000  BP: 119/89 (!) 133/99  Pulse: (!) 110 (!) 120  Resp: 20   Temp: 98.2 F (36.8 C)   SpO2: 98%    Vitals:   06/26/20 2021 06/26/20 2022 06/27/20 0603 06/27/20 1000  BP: 116/84 116/84 119/89 (!) 133/99  Pulse: 73 73 (!) 110 (!) 120  Resp: 20 20 20    Temp: 98 F (36.7 C) 98 F (36.7 C) 98.2 F (36.8 C)   TempSrc: Oral Oral Oral   SpO2: 91% 91% 98%   Weight:      Height:        General: Pt is alert, awake, not in acute distress Cardiovascular: RRR, S1/S2 +, no rubs, no gallops Respiratory: CTA bilaterally, no wheezing, no rhonchi Abdominal: Soft, NT, ND, bowel sounds + Extremities: no edema, no cyanosis   The results of significant diagnostics from this hospitalization (including imaging, microbiology, ancillary and laboratory) are listed below for reference.    Significant Diagnostic Studies: CT HEAD WO CONTRAST  Result Date: 06/24/2020 CLINICAL DATA:  40 year old male with altered mental status. EXAM: CT HEAD WITHOUT CONTRAST TECHNIQUE: Contiguous axial images were obtained from the base of the skull through the vertex without intravenous contrast. COMPARISON:  Head CT dated 01/22/2014. FINDINGS: Brain: The ventricles and sulci appropriate size for patient's age. The gray-white  matter discrimination is preserved. There is no acute intracranial hemorrhage. No mass effect or midline shift. No extra-axial fluid collection. Vascular: No hyperdense vessel or unexpected calcification. Skull: Normal. Negative for fracture or focal lesion. Sinuses/Orbits: No acute finding. Other: None IMPRESSION: Unremarkable noncontrast CT of the brain. Electronically Signed   By: 01/24/2014 M.D.   On: 06/24/2020 23:01     Microbiology: Recent Results (from the past 240 hour(s))  SARS CORONAVIRUS 2 (Hosey Burmester 6-24 HRS) Nasopharyngeal Nasopharyngeal Swab     Status: None   Collection Time: 06/24/20 10:56 AM   Specimen: Nasopharyngeal Swab  Result Value Ref Range Status   SARS Coronavirus 2 NEGATIVE NEGATIVE Final    Comment: (NOTE) SARS-CoV-2 target nucleic acids are NOT DETECTED.  The SARS-CoV-2 RNA is generally detectable in upper and lower respiratory specimens during the acute phase of infection. Negative results do not preclude SARS-CoV-2 infection, do not rule out co-infections with other pathogens, and should not be used as the sole basis for treatment or other patient management decisions. Negative results must be combined with clinical observations, patient history, and epidemiological information. The expected result is Negative.  Fact Sheet for Patients: 06/26/20  Fact Sheet for Healthcare Providers: HairSlick.no  This test is not yet approved or cleared by the quierodirigir.com  FDA and  has been authorized for detection and/or diagnosis of SARS-CoV-2 by FDA under an Emergency Use Authorization (EUA). This EUA will remain  in effect (meaning this test can be used) for the duration of the COVID-19 declaration under Se ction 564(b)(1) of the Act, 21 U.S.C. section 360bbb-3(b)(1), unless the authorization is terminated or revoked sooner.  Performed at [facility], [address] [place],  [place] [address]   MRSA PCR Screening     Status: None   Collection Time: 06/25/20 12:30 PM   Specimen: Nasopharyngeal  Result Value Ref Range Status   MRSA by PCR NEGATIVE NEGATIVE Final    Comment:        The GeneXpert MRSA Assay (FDA approved for NASAL specimens only), is one component of a comprehensive MRSA colonization surveillance program. It is not intended to diagnose MRSA infection nor to guide or monitor treatment for MRSA infections. Performed at [facility], [address] [place], [place] [address]      Labs: Basic Metabolic Panel: Recent Labs  Lab 06/24/20 0922 06/25/20 0523 06/27/20 0354  NA 139 145 137  K 3.4* 3.9 3.4*  CL 105 114* 104  CO2 24 23 27   GLUCOSE 115* 94 89  BUN 15 18 7   CREATININE 0.87 0.71 0.51*  CALCIUM 9.4 8.6* 8.4*  MG  --  2.3 1.7  PHOS  --  3.2  --    Liver Function Tests: Recent Labs  Lab 06/24/20 0922  AST 16  ALT 13  ALKPHOS 47  BILITOT 0.8  PROT 7.4  ALBUMIN 4.4   No results for input(s): LIPASE, AMYLASE in the last 168 hours. No results for input(s): AMMONIA in the last 168 hours. CBC: Recent Labs  Lab 06/24/20 0922 06/25/20 0523 06/27/20 0354  WBC 9.9 8.0 6.7  NEUTROABS 6.6  --   --   HGB 16.4 13.8 13.9  HCT 48.1 44.4 42.3  MCV 96.0 102.5* 98.6  PLT 295 209 216   Cardiac Enzymes: No results for input(s): CKTOTAL, CKMB, CKMBINDEX, TROPONINI in the last 168 hours. BNP: Invalid input(s): POCBNP CBG: No results for input(s): GLUCAP in the last 168 hours.  Time coordinating discharge:  36 minutes  Signed:  06/27/20, DO [facility] Pager: [phone] 06/27/2020, 10:35 AM

## 2020-06-27 NOTE — Progress Notes (Signed)
Pt left prior to discharge order.  Pt asked repeatedly to wait for discharge instructions.  MD notified via secure chat

## 2020-06-27 NOTE — Plan of Care (Signed)
  Problem: Education: Goal: Knowledge of General Education information will improve Description: Including pain rating scale, medication(s)/side effects and non-pharmacologic comfort measures Outcome: Adequate for Discharge   Problem: Health Behavior/Discharge Planning: Goal: Ability to manage health-related needs will improve Outcome: Adequate for Discharge   Problem: Clinical Measurements: Goal: Ability to maintain clinical measurements within normal limits will improve Outcome: Adequate for Discharge Goal: Will remain free from infection Outcome: Adequate for Discharge Goal: Diagnostic test results will improve Outcome: Adequate for Discharge Goal: Respiratory complications will improve Outcome: Adequate for Discharge Goal: Cardiovascular complication will be avoided Outcome: Adequate for Discharge   Problem: Activity: Goal: Risk for activity intolerance will decrease Outcome: Adequate for Discharge   Problem: Nutrition: Goal: Adequate nutrition will be maintained Outcome: Adequate for Discharge   Problem: Coping: Goal: Level of anxiety will decrease Outcome: Adequate for Discharge   Problem: Elimination: Goal: Will not experience complications related to bowel motility Outcome: Adequate for Discharge Goal: Will not experience complications related to urinary retention Outcome: Adequate for Discharge   Problem: Pain Managment: Goal: General experience of comfort will improve Outcome: Adequate for Discharge   Problem: Safety: Goal: Ability to remain free from injury will improve Outcome: Adequate for Discharge   Problem: Skin Integrity: Goal: Risk for impaired skin integrity will decrease Outcome: Adequate for Discharge   Problem: Education: Goal: Knowledge of disease or condition will improve Outcome: Adequate for Discharge Goal: Understanding of discharge needs will improve Outcome: Adequate for Discharge   Problem: Health Behavior/Discharge  Planning: Goal: Ability to identify changes in lifestyle to reduce recurrence of condition will improve Outcome: Adequate for Discharge Goal: Identification of resources available to assist in meeting health care needs will improve Outcome: Adequate for Discharge   Problem: Physical Regulation: Goal: Complications related to the disease process, condition or treatment will be avoided or minimized Outcome: Adequate for Discharge   Problem: Safety: Goal: Ability to remain free from injury will improve Outcome: Adequate for Discharge   

## 2020-06-27 NOTE — TOC Transition Note (Signed)
Transition of Care [facility]) - CM/SW Discharge Note   Patient Details  Name: Andre Lynch MRN: [id number] Date of Birth: 1980/08/24  Transition of Care [facility]) CM/SW Contact:  Shade Flood, LCSW Phone Number: 06/27/2020, 11:23 AM   Clinical Narrative:     Pt cleared by TTS. IVC rescinded. Magistrate received the paperwork via fax. Pt is medically clear for dc. Met with pt to provide resource information on outpatient treatment, shelters, and local addiction MD specialist. Pt stated that he did not have any other TOC needs for dc.  Expected Discharge Plan: Home/Self Care Barriers to Discharge: Barriers Resolved   Patient Goals and CMS Choice   CMS Medicare.gov Compare Post Acute Care list provided to:: Patient    Expected Discharge Plan and Services Expected Discharge Plan: Home/Self Care In-house Referral: Clinical Social Work   Post Acute Care Choice: Resumption of Svcs/PTA Provider   Expected Discharge Date: 06/27/20                                    Prior Living Arrangements/Services   Lives with:: Self Patient language and need for interpreter reviewed:: Yes        Need for Family Participation in Patient Care: No (Comment) Care giver support system in place?: No (comment)   Criminal Activity/Legal Involvement Pertinent to Current Situation/Hospitalization: No - Comment as needed  Activities of Daily Living Home Assistive Devices/Equipment: None ADL Screening (condition at time of admission) Patient's cognitive ability adequate to safely complete daily activities?: Yes Is the patient deaf or have difficulty hearing?: No Does the patient have difficulty seeing, even when wearing glasses/contacts?: No Does the patient have difficulty concentrating, remembering, or making decisions?: Yes Patient able to express need for assistance with ADLs?: No Does the patient have difficulty dressing or bathing?: No Independently performs ADLs?: Yes (appropriate for  developmental age) Does the patient have difficulty walking or climbing stairs?: No Weakness of Legs: Both Weakness of Arms/Hands: None  Permission Sought/Granted                  Emotional Assessment Appearance:: Appears stated age Attitude/Demeanor/Rapport: Engaged Affect (typically observed): Pleasant Orientation: : Oriented to Self,Oriented to Place,Oriented to  Time,Oriented to Situation Alcohol / Substance Use: Not Applicable Psych Involvement: Yes (comment)  Admission diagnosis:  Agitation [R45.1] Narcotic withdrawal ([place]) [F11.23] Acute encephalopathy [G93.40] Delusional disorder ([place]) [F22] Benzodiazepine withdrawal without complication ([place]) [Z16.967] Patient Active Problem List   Diagnosis Date Noted  . 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 Delusional disorder ([place])   . Acute encephalopathy 06/25/2020  . Substance use disorder 06/25/2020  . Chronic pain syndrome 06/25/2020  . Benzodiazepine withdrawal without complication ([place])   . Substance withdrawal, with delirium ([place]) 06/24/2020  . Seizures ([place]) 06/24/2020  . H/O: substance abuse ([place]) 07/28/2012  . GAD (generalized anxiety disorder) 07/28/2012   PCP:  Curly Rim, MD Pharmacy:   [facility], [place] [address] Phone: [phone] Fax: [phone]  [facility] [place], [place] [address] [place] [address] Phone: [phone] Fax: [phone] [address] [address], [place] [address] [place] [address] Phone: [phone] Fax: [phone]     Social Determinants of Health (SDOH) Interventions    Readmission Risk Interventions  Readmission Risk Prevention Plan 06/27/2020  Transportation Screening Complete  Home Care Screening Complete  Medication Review (RN CM) Complete  Some recent data  might be hidden    Final next level of care: Home/Self Care Barriers to Discharge: Barriers Resolved   Patient Goals and CMS Choice   CMS Medicare.gov Compare Post Acute Care list provided to:: Patient    Discharge Placement                       Discharge Plan and Services In-house Referral: Clinical Social Work   Post Acute Care Choice: Resumption of Svcs/PTA Provider                               Social Determinants of Health (SDOH) Interventions     Readmission Risk Interventions Readmission Risk Prevention Plan 06/27/2020  Transportation Screening Complete  Home Care Screening Complete  Medication Review (RN CM) Complete  Some recent data might be hidden

## 2020-06-29 ENCOUNTER — Telehealth: Payer: 59 | Admitting: Physician Assistant

## 2020-06-29 ENCOUNTER — Encounter: Payer: Self-pay | Admitting: Physician Assistant

## 2020-06-29 DIAGNOSIS — F1999 Other psychoactive substance use, unspecified with unspecified psychoactive substance-induced disorder: Secondary | ICD-10-CM | POA: Diagnosis not present

## 2020-06-29 NOTE — Progress Notes (Signed)
I connected with  Andre Lynch on 06/29/20 by a video enabled telemedicine application and verified that I am speaking with the correct person using two identifiers.   I discussed the limitations of evaluation and management by telemedicine. The patient expressed understanding and agreed to proceed.    New Patient Office Visit  Subjective:  Patient ID: Andre Lynch, male    DOB: [birth year]  Age: 40 y.o. MRN: [id number]  CC:  Chief Complaint  Patient presents with  . Medication Problem   Virtual Visit via Video Note  I connected with Andre Lynch on 06/29/20 at 11:45 AM EST by a video enabled telemedicine application and verified that I am speaking with the correct person using two identifiers.  Location: Patient: Home  provider: Working remotely from home   I discussed the limitations of evaluation and management by telemedicine and the availability of in person appointments. The patient expressed understanding and agreed to proceed.  History of Present Illness:  HPI Andre Lynch reports that he was recently hospitalized from January 20 5 January 28th 2022  Hospital course   Discharge Diagnoses:  Acute toxic encephalopathy -Secondary to illicit substance (methamphetamine) use with a component of withdrawal -although patient denied meth use, his use was validated by his mother -PDMPreviewed--patient has been receiving buprenorphine 8 mg, last on 05/15/20,#65 andclonazepam 1 mg, #60 on 05/17/20 -Patient has been receiving this on a nearly monthly basis by one provider -UA negative for pyuria -TSH 1.690 -UDS positive for amphetamines and benzodiazepines -Alcohol level negative -wean off Precedex-->wean off today and move out of ICU -Restart buprenorphine -Restart clonazepam  -TTS eval once medically stableafter weaned off Precedex -06/26/20--more lucid today -06/27/20--lucid, A&O x 4 and medically stable -seen by telepsychiatry whom cleared patient for  discharge  Hypokalemia -Repleted -mag 1.7  Reports today that he has not been able to restart his medications due to a lack of transportation to be able to see his prescriber in [place].    Observations/Objective: Medical history and current medications reviewed, no physical exam completed   Past Medical History:  Diagnosis Date  . Anxiety   . Back pain   . Chronic knee pain   . Depression   . Gastric ulcer   . History of narcotic addiction (HCC)    methadone tx  . Kidney stones   . RLS (restless legs syndrome)   . Seizures (HCC)    last one early 2018  . Substance abuse (HCC)    cocaine, benzos, marijuana, benzos, opiates    Past Surgical History:  Procedure Laterality Date  . HERNIA REPAIR    . WISDOM TOOTH EXTRACTION      Family History  Problem Relation Age of Onset  . Cancer Mother        breast cancer survivor    Social History   Socioeconomic History  . Marital status: Single    Spouse name: Not on file  . Number of children: Not on file  . Years of education: Not on file  . Highest education level: Not on file  Occupational History  . Occupation: unemployed  Tobacco Use  . Smoking status: Current Every Day Smoker    Packs/day: 0.50    Years: 20.00    Pack years: 10.00    Types: Cigarettes  . Smokeless tobacco: Never Used  Substance and Sexual Activity  . Alcohol use: Not Currently  . Drug use: Not Currently  . Sexual activity: Not Currently  Other Topics  Concern  . Not on file  Social History Narrative  . Not on file   Social Determinants of Health   Financial Resource Strain: Not on file  Food Insecurity: Not on file  Transportation Needs: Not on file  Physical Activity: Not on file  Stress: Not on file  Social Connections: Not on file  Intimate Partner Violence: Not on file    ROS Review of Systems  Constitutional: Negative for chills and fever.  HENT: Negative.   Eyes: Negative.   Respiratory: Negative.    Cardiovascular: Negative for chest pain and palpitations.  Gastrointestinal: Negative.   Endocrine: Negative.   Genitourinary: Negative.   Musculoskeletal: Negative.   Skin: Negative.   Allergic/Immunologic: Negative.   Neurological: Negative for dizziness and weakness.  Hematological: Negative.   Psychiatric/Behavioral: Negative.     Objective:   Today's Vitals: There were no vitals taken for this visit.    Assessment & Plan:   Problem List Items Addressed This Visit      Other   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HCC) - Primary      Assessment and Plan:  1. 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HCC) Explained to patient that we are not able to refill this type of medication he needed to follow-up with his provider patient understands and agrees. Patient encouraged to follow-up with primary care provider for hospital follow-up. Patient understands and agrees Red flags given for prompt evaluation in emergency department   Follow Up Instructions:    I discussed the assessment and treatment plan with the patient. The patient was provided an opportunity to ask questions and all were answered. The patient agreed with the plan and demonstrated an understanding of the instructions.   The patient was advised to call back or seek an in-person evaluation if the symptoms worsen or if the condition fails to improve as anticipated.    Outpatient Encounter Medications as of 06/29/2020  Medication Sig  . buprenorphine (SUBUTEX) 8 MG SUBL SL tablet 4 mg 2 (two) times a day.   . clonazePAM (KLONOPIN) 1 MG tablet Take 1 mg by mouth 3 (three) times daily as needed.   No facility-administered encounter medications on file as of 06/29/2020.    Follow-up: Return if symptoms worsen or fail to improve.   Kasandra Knudsen Drystan Reader, PA-C

## 2020-06-29 NOTE — Patient Instructions (Signed)
I encourage you to follow-up with your primary care provider for hospital follow-up as well as you are buprenorphine provider.  Please let us know if there is anything else we can do for you  Roney Jaffe, PA-C Physician Assistant [facility] [email or web address]

## 2020-09-03 ENCOUNTER — Other Ambulatory Visit (HOSPITAL_BASED_OUTPATIENT_CLINIC_OR_DEPARTMENT_OTHER): Payer: Self-pay

## 2020-09-19 DIAGNOSIS — Z206 Contact with and (suspected) exposure to human immunodeficiency virus [HIV]: Secondary | ICD-10-CM | POA: Insufficient documentation

## 2020-09-19 DIAGNOSIS — F419 Anxiety disorder, unspecified: Secondary | ICD-10-CM | POA: Insufficient documentation

## 2020-09-19 DIAGNOSIS — G4700 Insomnia, unspecified: Secondary | ICD-10-CM | POA: Insufficient documentation

## 2020-09-19 DIAGNOSIS — Z8659 Personal history of other mental and behavioral disorders: Secondary | ICD-10-CM | POA: Insufficient documentation

## 2020-09-19 DIAGNOSIS — F1911 Other psychoactive substance abuse, in remission: Secondary | ICD-10-CM | POA: Insufficient documentation

## 2020-12-08 DIAGNOSIS — Z7251 High risk heterosexual behavior: Secondary | ICD-10-CM | POA: Insufficient documentation

## 2021-06-18 ENCOUNTER — Emergency Department (HOSPITAL_COMMUNITY)
Admission: EM | Admit: 2021-06-18 | Discharge: 2021-06-18 | Disposition: A | Discharge status: Home / Self Care | Payer: 59 | Attending: Emergency Medicine | Admitting: Emergency Medicine

## 2021-06-18 ENCOUNTER — Encounter (HOSPITAL_COMMUNITY): Payer: Self-pay | Admitting: *Deleted

## 2021-06-18 ENCOUNTER — Emergency Department (HOSPITAL_COMMUNITY): Payer: 59

## 2021-06-18 DIAGNOSIS — M545 Low back pain, unspecified: Secondary | ICD-10-CM | POA: Diagnosis present

## 2021-06-18 HISTORY — DX: Unspecified viral hepatitis C without hepatic coma: B19.20

## 2021-06-18 MED ORDER — METHYLPREDNISOLONE SODIUM SUCC 125 MG IJ SOLR
125.0000 mg | Freq: Once | INTRAMUSCULAR | Status: AC
  Administered 2021-06-18: 125 mg via INTRAMUSCULAR
  Filled 2021-06-18: qty 2

## 2021-06-18 MED ORDER — KETOROLAC TROMETHAMINE 60 MG/2ML IM SOLN
60.0000 mg | Freq: Once | INTRAMUSCULAR | Status: AC
Start: 2021-06-18 — End: 2021-06-18
  Administered 2021-06-18: 60 mg via INTRAMUSCULAR
  Filled 2021-06-18: qty 2

## 2021-06-18 MED ORDER — METHOCARBAMOL 500 MG PO TABS: 500.0000 mg | ORAL_TABLET | Freq: Four times a day (QID) | ORAL | 0 refills | Status: DC

## 2021-06-18 NOTE — ED Notes (Signed)
Patient transported to X-ray 

## 2021-06-18 NOTE — ED Triage Notes (Signed)
Low back pain . 

## 2021-06-18 NOTE — Discharge Instructions (Addendum)
Follow up with your Physician for recheck  

## 2021-06-18 NOTE — ED Provider Notes (Signed)
[facility] EMERGENCY DEPARTMENT Provider Note   CSN: [id number] Arrival date & time: 06/18/21  1755     History  Chief Complaint  Patient presents with   Back Pain    Andre Lynch is a 41 y.o. male.  Pt reports he injured his back several years ago.  Pt reports he has had pain on and off.  Pt request hepatitis c test.  Pt reports he is worried about and wants test.  Pt also request a referral to behavioral health   The history is provided by the patient. No language interpreter was used.  Back Pain Location:  Lumbar spine Quality:  Aching Pain severity:  Moderate Pain is:  Same all the time Onset quality:  Gradual Timing:  Constant Progression:  Worsening Chronicity:  New Relieved by:  Nothing Worsened by:  Nothing Ineffective treatments:  None tried Associated symptoms: no abdominal pain       Home Medications Prior to Admission medications   Medication Sig Start Date End Date Taking? Authorizing Provider  methocarbamol (ROBAXIN) 500 MG tablet Take 1 tablet (500 mg total) by mouth 4 (four) times daily. 06/18/21  Yes Cheron Schaumann K, PA-C  buprenorphine (SUBUTEX) 8 MG SUBL SL tablet 4 mg 2 (two) times a day.  10/26/16   [provider]  clonazePAM (KLONOPIN) 1 MG tablet Take 1 mg by mouth 3 (three) times daily as needed. 05/17/20   [provider]      Allergies    Ibuprofen and Tylenol [acetaminophen]    Review of Systems   Review of Systems  Gastrointestinal:  Negative for abdominal pain.  Musculoskeletal:  Positive for back pain.  All other systems reviewed and are negative.  Physical Exam Updated Vital Signs BP 124/87    Pulse (!) 117    Temp 98.7 F (37.1 C) (Oral)    Resp 20    SpO2 95%  Physical Exam Vitals and nursing note reviewed.  Constitutional:      Appearance: He is well-developed.  HENT:     Head: Normocephalic.  Cardiovascular:     Rate and Rhythm: Normal rate and regular rhythm.  Pulmonary:     Effort: Pulmonary  effort is normal.  Abdominal:     General: There is no distension.  Musculoskeletal:        General: Normal range of motion.     Cervical back: Normal range of motion.     Comments: tender ls spine diffusely   Skin:    General: Skin is warm.  Neurological:     Mental Status: He is alert and oriented to person, place, and time.  Psychiatric:        Mood and Affect: Mood normal.    ED Results / Procedures / Treatments   Labs (all labs ordered are listed, but only abnormal results are displayed) Labs Reviewed  HEPATITIS PANEL, ACUTE    EKG None  Radiology DG Lumbar Spine Complete  Result Date: 06/18/2021 CLINICAL DATA:  Lower back pain for 4 days, bilateral lower extremity radiculopathy EXAM: LUMBAR SPINE - COMPLETE 4+ VIEW COMPARISON:  06/10/2013 FINDINGS: Frontal, bilateral oblique, lateral views of the lumbar spine are obtained. There are 5 non-rib-bearing lumbar type vertebral bodies, with left convex scoliosis again noted at the L2-3 level. Otherwise alignment is anatomic. No acute fractures. Mild multilevel spondylosis and facet hypertrophy greatest at L5-S1. Sacroiliac joints are unremarkable. IMPRESSION: 1. Stable left convex scoliosis with mild diffuse spondylosis and facet hypertrophy as above. No significant  change since prior exam. Electronically Signed   By: Sharlet Salina M.D.   On: 06/18/2021 19:09    Procedures Procedures    Medications Ordered in ED Medications  methylPREDNISolone sodium succinate (SOLU-MEDROL) 125 mg/2 mL injection 125 mg (125 mg Intramuscular Given 06/18/21 1919)  ketorolac (TORADOL) injection 60 mg (60 mg Intramuscular Given 06/18/21 1918)    ED Course/ Medical Decision Making/ A&P                           Medical Decision Making Amount and/or Complexity of Data Reviewed Labs: ordered. Radiology: ordered.  Risk Prescription drug management.   MDM:  ls spine no acute abnormality,  Pt hepatitis test pending.  Pt given referral to  behavioral health in [place] Pt given solumedrol and toradol IM.   Pt advised to follow up with his primary MD for recheck        Final Clinical Impression(s) / ED Diagnoses Final diagnoses:  Acute low back pain without sciatica, unspecified back pain laterality    Rx / DC Orders ED Discharge Orders          Ordered    methocarbamol (ROBAXIN) 500 MG tablet  4 times daily        06/18/21 2005           An After Visit Summary was printed and given to the patient.    Osie Cheeks 06/18/21 2220    Eber Hong, MD 06/19/21 [phone]

## 2021-06-18 NOTE — ED Notes (Signed)
Pt d/c home per MD order. Discharge summary reviewed, pt verbalizes understanding. Ambulatory off unit. No s/s of acute distress noted at discharge.  °

## 2021-06-19 LAB — HEPATITIS PANEL, ACUTE
HCV Ab: REACTIVE — AB
Hep A IgM: NONREACTIVE
Hep B C IgM: NONREACTIVE
Hepatitis B Surface Ag: NONREACTIVE

## 2021-06-23 ENCOUNTER — Other Ambulatory Visit: Payer: Self-pay

## 2021-06-23 ENCOUNTER — Emergency Department (HOSPITAL_COMMUNITY): Payer: 59

## 2021-06-23 ENCOUNTER — Encounter (HOSPITAL_COMMUNITY): Payer: Self-pay | Admitting: *Deleted

## 2021-06-23 ENCOUNTER — Encounter (INDEPENDENT_AMBULATORY_CARE_PROVIDER_SITE_OTHER): Payer: Self-pay | Admitting: Gastroenterology

## 2021-06-23 ENCOUNTER — Ambulatory Visit (INDEPENDENT_AMBULATORY_CARE_PROVIDER_SITE_OTHER): Payer: 59 | Admitting: Gastroenterology

## 2021-06-23 ENCOUNTER — Telehealth (INDEPENDENT_AMBULATORY_CARE_PROVIDER_SITE_OTHER): Payer: Self-pay

## 2021-06-23 ENCOUNTER — Emergency Department (HOSPITAL_COMMUNITY)
Admission: EM | Admit: 2021-06-23 | Discharge: 2021-06-23 | Disposition: A | Discharge status: Home / Self Care | Payer: 59 | Attending: Emergency Medicine | Admitting: Emergency Medicine

## 2021-06-23 VITALS — BP 141/93 | HR 97 | Temp 98.3°F | Ht 69.0 in | Wt 199.1 lb

## 2021-06-23 DIAGNOSIS — R0602 Shortness of breath: Secondary | ICD-10-CM | POA: Diagnosis not present

## 2021-06-23 DIAGNOSIS — R600 Localized edema: Secondary | ICD-10-CM | POA: Insufficient documentation

## 2021-06-23 DIAGNOSIS — R768 Other specified abnormal immunological findings in serum: Secondary | ICD-10-CM | POA: Insufficient documentation

## 2021-06-23 DIAGNOSIS — F99 Mental disorder, not otherwise specified: Secondary | ICD-10-CM | POA: Diagnosis not present

## 2021-06-23 DIAGNOSIS — R609 Edema, unspecified: Secondary | ICD-10-CM

## 2021-06-23 LAB — CBC WITH DIFFERENTIAL/PLATELET
Abs Immature Granulocytes: 0.02 10*3/uL (ref 0.00–0.07)
Basophils Absolute: 0 10*3/uL (ref 0.0–0.1)
Basophils Relative: 1 %
Eosinophils Absolute: 0.3 10*3/uL (ref 0.0–0.5)
Eosinophils Relative: 4 %
HCT: 38.3 % — ABNORMAL LOW (ref 39.0–52.0)
Hemoglobin: 13.1 g/dL (ref 13.0–17.0)
Immature Granulocytes: 0 %
Lymphocytes Relative: 42 %
Lymphs Abs: 2.9 10*3/uL (ref 0.7–4.0)
MCH: 33 pg (ref 26.0–34.0)
MCHC: 34.2 g/dL (ref 30.0–36.0)
MCV: 96.5 fL (ref 80.0–100.0)
Monocytes Absolute: 0.5 10*3/uL (ref 0.1–1.0)
Monocytes Relative: 8 %
Neutro Abs: 3.1 10*3/uL (ref 1.7–7.7)
Neutrophils Relative %: 45 %
Platelets: 265 10*3/uL (ref 150–400)
RBC: 3.97 MIL/uL — ABNORMAL LOW (ref 4.22–5.81)
RDW: 14 % (ref 11.5–15.5)
WBC: 6.9 10*3/uL (ref 4.0–10.5)
nRBC: 0 % (ref 0.0–0.2)

## 2021-06-23 LAB — BASIC METABOLIC PANEL
Anion gap: 9 (ref 5–15)
BUN: 12 mg/dL (ref 6–20)
CO2: 27 mmol/L (ref 22–32)
Calcium: 9 mg/dL (ref 8.9–10.3)
Chloride: 104 mmol/L (ref 98–111)
Creatinine, Ser: 0.75 mg/dL (ref 0.61–1.24)
GFR, Estimated: >60 mL/min (ref >60)
Glucose, Bld: 112 mg/dL — ABNORMAL HIGH (ref 70–99)
Potassium: 4.2 mmol/L (ref 3.5–5.1)
Sodium: 140 mmol/L (ref 135–145)

## 2021-06-23 LAB — BRAIN NATRIURETIC PEPTIDE: B Natriuretic Peptide: 27 pg/mL (ref 0.0–100.0)

## 2021-06-23 MED ORDER — FUROSEMIDE 40 MG PO TABS
40.0000 mg | ORAL_TABLET | Freq: Once | ORAL | Status: AC
Start: 2021-06-23 — End: 2021-06-23
  Administered 2021-06-23: 21:00:00 40 mg via ORAL
  Filled 2021-06-23: qty 1

## 2021-06-23 NOTE — Progress Notes (Addendum)
Referring Provider: [name], MD Primary Care Physician:  [name], MD Primary GI Physician: newly established  Chief Complaint  Patient presents with   Follow-up    Patient here today for ed follow from 06/18/2021. He states he has gained some weight and has some lower back pain. He denies any nausea, vomiting, diarrhea, constipation or any other Gi issues. He does report bilateral lower extremity edema.   HPI:   Andre Lynch is a 41 y.o. male with past medical history of anxiety, back pain, depression, Hep C, hx of narcotic addiction, seizures, substance abuse.  Patient presenting today as new patient for ED follow up after having positive HCV Ab on acute hepatitis testing that patient requested.   Per EMR patient has referral to ID for further management of Hep C by his pain management doctor that he saw yesterday.  Patient states that he was told previously that he had Hepatitis C when he was in [place], this was last October, but he has never undergone any treatment. He states that he feels he contracted Hep C through sexual relations though he does report previous hx of IVDU but last use was about 4 years ago. Patient states that he has had swelling in his ankles and some in his belly for the past month along with SOB. He states he has gained about 20 pounds recently, though he has also had an increase in his appetite. He denies any jaundice or itching, no episodes of confusion. Denies nausea, vomiting, abdominal pain, rectal bleeding or black stools.  No previous LFTs available for review in EMR.  States that he has acid reflux almost daily, he takes otc prevacid sometimes, feels like this helps some when he takes it. he does endorse that amitryptiline seems to make his mouth somewhat dry and he feels this worsens his acid reflux.   Patient requesting referral to [facility] as he has hx of anxiety and depression, previously hospitalized in [place] for the same, pain  management provider will no longer prescribe his klonopin, he attempted to establish with [facility] here in [place] but was told they were not taking new medication management patients.   NSAID use: aleve occasionally Social hx: no etoh, does smoke, no illicit drugs currently Fam hx:no crc or liver disease  Last Colonoscopy:never Last Endoscopy:never  Past Medical History:  Diagnosis Date   Anxiety    Back pain    Chronic knee pain    Depression    Gastric ulcer    Hepatitis-C    History of narcotic addiction (HCC)    methadone tx   Kidney stones    RLS (restless legs syndrome)    Seizures (HCC)    last one early 2018   Substance abuse (HCC)    cocaine, benzos, marijuana, benzos, opiates    Past Surgical History:  Procedure Laterality Date   HERNIA REPAIR     WISDOM TOOTH EXTRACTION      Current Outpatient Medications  Medication Sig Dispense Refill   ARIPiprazole (ABILIFY) 5 MG tablet Take 5 mg by mouth daily.     buprenorphine (SUBUTEX) 8 MG SUBL SL tablet 8 mg 2 (two) times a day.     clonazePAM (KLONOPIN) 0.5 MG tablet Take 0.25-0.5 mg by mouth daily as needed.     gabapentin (NEURONTIN) 600 MG tablet Take 600 mg by mouth 3 (three) times daily.     methocarbamol (ROBAXIN) 500 MG tablet Take 1 tablet (500 mg total) by mouth  4 (four) times daily. 40 tablet 0   mirtazapine (REMERON) 7.5 MG tablet Take by mouth.     propranolol (INDERAL) 20 MG tablet Take 10-20 mg by mouth 2 (two) times daily as needed.     clonazePAM (KLONOPIN) 1 MG tablet Take 1 mg by mouth 3 (three) times daily as needed. (Patient not taking: Reported on 06/23/2021)     No current facility-administered medications for this visit.    Allergies as of 06/23/2021 - Review Complete 06/23/2021  Allergen Reaction Noted   Ibuprofen Other (See Comments) 11/06/2012   Tylenol [acetaminophen] Other (See Comments) 11/06/2012   Family History  Problem Relation Age of Onset   Cancer Mother        breast cancer  survivor    Social History   Socioeconomic History   Marital status: Single    Spouse name: Not on file   Number of children: Not on file   Years of education: Not on file   Highest education level: Not on file  Occupational History   Occupation: unemployed  Tobacco Use   Smoking status: Every Day    Packs/day: 0.50    Years: 20.00    Pack years: 10.00    Types: Cigarettes   Smokeless tobacco: Never  Vaping Use   Vaping Use: Some days  Substance and Sexual Activity   Alcohol use: Not Currently   Drug use: Not Currently   Sexual activity: Not Currently  Other Topics Concern   Not on file  Social History Narrative   Not on file   Social Determinants of Health   Financial Resource Strain: Not on file  Food Insecurity: Not on file  Transportation Needs: Not on file  Physical Activity: Not on file  Stress: Not on file  Social Connections: Not on file   Review of systems General: negative for malaise, night sweats, fever, chills, weight loss +weight gain Neck: Negative for lumps, goiter, pain and significant neck swelling Resp: Negative for cough, wheezing, +SOB CV: Negative for chest pain, leg swelling, palpitations, orthopnea GI: denies melena, hematochezia, nausea, vomiting, diarrhea, constipation, dysphagia, odyonophagia, early satiety or unintentional weight loss. +weight gain +reflux MSK: Negative for joint pain or swelling, back pain, and muscle pain. Derm: Negative for itching or rash Psych: Denies depression, anxiety, memory loss, confusion. No homicidal or suicidal ideation.  Heme: Negative for prolonged bleeding, bruising easily, and swollen nodes. Endocrine: Negative for cold or heat intolerance, polyuria, polydipsia and goiter. Neuro: negative for tremor, gait imbalance, syncope and seizures. The remainder of the review of systems is noncontributory.  Physical Exam: BP (!) 141/93 (BP Location: Right Arm, Patient Position: Sitting, Cuff Size: Large)     Pulse 97    Temp 98.3 F (36.8 C) (Oral)    Ht 5\' 9"  (1.753 m)    Wt 199 lb 1.6 oz (90.3 kg)    BMI 29.40 kg/m  General:   Alert and oriented. No distress noted. Pleasant and cooperative.  Head:  Normocephalic and atraumatic. Eyes:  Conjuctiva clear without scleral icterus. Mouth:  Oral mucosa pink and moist. Good dentition. No lesions. Heart: Normal rate and rhythm, s1 and s2 heart sounds present.  Lungs: Clear lung sounds in all lobes. Respirations equal and unlabored. Abdomen:  +BS, soft, non-tender, full but not taut. No rebound or guarding. No HSM or masses noted. Derm: No palmar erythema or jaundice Msk:  Symmetrical without gross deformities. Normal posture. Extremities: non pitting edema to bilateral LEs Neurologic:  Alert and  oriented x4, no confusion Psych:  Alert and cooperative. Normal mood and affect.  Invalid input(s): 6 MONTHS   ASSESSMENT: CHAYTON MURATA is a 41 y.o. male presenting today for further evaluation of positive Hep C antibody testing in the ED on 1/19, with known hx of Hep C, never treated.  Patient reports initial diagnosis of Hep C was when he lived in [place] last October, he denies ever undergoing any treatment. We will proceed with further work up to evaluate Hep C status and possible disease progression to cirrhosis as he tells me that he has noticed swelling to his ankles and occasionally to his abdomen, he does have non pitting edema to bilateral LEs with some mild ascites present, and reports of SOB with some recent weight gain though he is in NAD. We will proceed with further evaluation of his Hep C status, liver function and evaluation of possible cirrhosis secondary to untreated Hep C. Further recommendations to follow.   PLAN:  -HCV RNA, genotype, CBC, CMP, HIV, INR, Hep B core Ab - US liver elastography  -Ambulatory referral to [facility] in [place] - Reduce salt intake to <2 g per day - Can take Tylenol max of 2 g per day (650 mg q8h) for pain -  Avoid NSAIDs for pain - Avoid eating raw oysters/shellfish  Follow Up: 3 months  Vyla Pint L. Jeanmarie Hubert, MSN, APRN, AGNP-C Adult-Gerontology Nurse Practitioner [facility]

## 2021-06-23 NOTE — ED Triage Notes (Signed)
Family member concerned he has recent weight gain and swelling of extremities, concerned he was not prescribed a fluid pill today

## 2021-06-23 NOTE — Telephone Encounter (Signed)
Patient seen this am. Patient mother called back stating she and the patient reviewed the AVS forms from the visit today and it stated the patient had localized Edema. Per patient mother she thinks the fluid is all over his body as she states she sees him daily. She reports the patient was seen last month by a doctor and at that time the patient weight was 175 lb. 199.1lb was his weight today at the visit. They think the patient is in need of a diuretic. I called the patient and advised that I would forward this message to the provider,but we would not be able to discuss his care with his mother unless he came back here and signed a release. He states understanding,but would like a call back regarding the need of a diuretic, or send one in to [place]. Please advise.

## 2021-06-23 NOTE — ED Provider Notes (Signed)
[facility] EMERGENCY DEPARTMENT Provider Note   CSN: [id number] Arrival date & time: 06/23/21  1836     History  No chief complaint on file.   Andre Lynch is a 41 y.o. male with past medical history significant for substance abuse, anxiety, seizures, recent hepatitis C diagnosis who is complaining of 25 pound weight gain over the last month, with edema on his legs, as well as abdomen.  He does also endorse some shortness of breath.  Patient denies chest pain, recent syncope, recent seizures, encephalopathy.  Patient reports that he is following closely with his GI doctor right now regarding his hep C, mostly concerned because he was not given any diuretics today despite his recent weight gain.  Patient denies significant over consumption of water.  HPI     Home Medications Prior to Admission medications   Medication Sig Start Date End Date Taking? Authorizing Provider  amitriptyline (ELAVIL) 150 MG tablet Take 150 mg by mouth at bedtime. 05/21/21  Yes [provider]  ARIPiprazole (ABILIFY) 5 MG tablet Take 5 mg by mouth daily.   Yes [provider]  buprenorphine (SUBUTEX) 8 MG SUBL SL tablet 8 mg 2 (two) times a day. 10/26/16  Yes [provider]  clonazePAM (KLONOPIN) 1 MG tablet Take 1 mg by mouth 3 (three) times daily as needed. 05/17/20  Yes [provider]  gabapentin (NEURONTIN) 600 MG tablet Take 600 mg by mouth 3 (three) times daily.   Yes [provider]  methocarbamol (ROBAXIN) 500 MG tablet Take 1 tablet (500 mg total) by mouth 4 (four) times daily. 06/18/21  Yes Cheron Schaumann K, PA-C  mirtazapine (REMERON) 7.5 MG tablet Take 7.5 mg by mouth at bedtime. 06/11/21  Yes [provider]  propranolol (INDERAL) 20 MG tablet Take 10-20 mg by mouth 2 (two) times daily as needed. 06/11/21  Yes [provider]      Allergies    Ibuprofen and Tylenol [acetaminophen]    Review of Systems   Review of Systems   Respiratory:  Positive for shortness of breath.   Cardiovascular:  Positive for leg swelling.  All other systems reviewed and are negative.  Physical Exam Updated Vital Signs BP (!) 124/99    Pulse 98    Temp 97.9 F (36.6 C) (Oral)    Resp 12    SpO2 95%  Physical Exam Vitals and nursing note reviewed.  Constitutional:      General: He is not in acute distress.    Appearance: Normal appearance.  HENT:     Head: Normocephalic and atraumatic.  Eyes:     General:        Right eye: No discharge.        Left eye: No discharge.  Cardiovascular:     Rate and Rhythm: Normal rate and regular rhythm.     Pulses: Normal pulses.     Heart sounds: No murmur heard.   No friction rub. No gallop.     Comments: DP, PT pulses intact bilaterally.  Radial, ulnar pulses intact bilaterally. Pulmonary:     Effort: Pulmonary effort is normal.     Breath sounds: Normal breath sounds.     Comments: Clear breath sounds throughout, no wheezing, rhonchi, stridor, rales.  No respiratory distress. Abdominal:     General: Bowel sounds are normal.     Palpations: Abdomen is soft.     Comments: Patient with a mild to moderate amount of ascites in the abdomen, no  tenderness to palpation, no signs of SBP, no rebound, rigidity, guarding.  Musculoskeletal:     Comments: 1-2+ pitting edema bilateral lower extremities.  The swelling is symmetric bilaterally, there is no redness, or significant tenderness to palpation of the calf.  Skin:    General: Skin is warm and dry.     Capillary Refill: Capillary refill takes less than 2 seconds.  Neurological:     Mental Status: He is alert and oriented to person, place, and time.  Psychiatric:        Mood and Affect: Mood normal.        Behavior: Behavior normal.    ED Results / Procedures / Treatments   Labs (all labs ordered are listed, but only abnormal results are displayed) Labs Reviewed  CBC WITH DIFFERENTIAL/PLATELET - Abnormal; Notable for the following  components:      Result Value   RBC 3.97 (*)    HCT 38.3 (*)    All other components within normal limits  BASIC METABOLIC PANEL - Abnormal; Notable for the following components:   Glucose, Bld 112 (*)    All other components within normal limits  BRAIN NATRIURETIC PEPTIDE    EKG None  Radiology DG Chest 2 View  Result Date: 06/23/2021 CLINICAL DATA:  Shortness of breath, fluid retention EXAM: CHEST - 2 VIEW COMPARISON:  11/03/2016 FINDINGS: Cardiac and mediastinal contours are within normal limits. Lower lung volumes. No focal pulmonary opacity. No pleural effusion or pneumothorax. No acute osseous abnormality. Redemonstrated dextrocurvature of the thoracic spine. IMPRESSION: No acute cardiopulmonary process. Electronically Signed   By: Wiliam KeAlison  Vasan M.D.   On: 06/23/2021 19:45    Procedures Procedures    Medications Ordered in ED Medications  furosemide (LASIX) tablet 40 mg (40 mg Oral Given 06/23/21 2046)    ED Course/ Medical Decision Making/ A&P                           Medical Decision Making Amount and/or Complexity of Data Reviewed Labs: ordered. Radiology: ordered.  Risk Prescription drug management.  I discussed this case with my attending physician who cosigned this note including patient's presenting symptoms, physical exam, and planned diagnostics and interventions. Attending physician stated agreement with plan or made changes to plan which were implemented.   This is an overall well-appearing patient who presents with concern for swelling, recent 25 pound weight gain.  He does have a known diagnosis of hepatitis C. On my evaluation patient is in no respiratory distress, no signs of unilateral swelling, or accessory breath sounds.  No signs of DVT.  No recent travel.  Patient shows no signs of spontaneous bacterial peritonitis, he does not have tenderness of the abdomen, he does have some significant fluid on the abdomen consistent with ascites.  Minimal  swelling of bilateral hands.  Recently ordered and reviewed lab work including CBC, BMP, BNP.  No significant abnormalities throughout.  Mild hyperglycemia noted.  I personally ordered and interpreted radiographic imaging of the chest which shows no significant cardiopulmonary abnormality.  I agree with radiologist interpretation.  Patient is overall well-appearing, no respiratory distress discussed with him that we can trial a single dose of Lasix to help with his mood retention acutely, but I would not recommend starting a new prescription without consultation with his gastroenterologist.  Encourage follow-up with gastroenterologist if swelling significantly worsens.  Discussed that treating his new hep C, and its resulting portal hypertension will be the  solution to his swelling.  Encouraged him to limit fluid intake to only drinking limited amounts of water, when body is thirsty, not overindulging.  Patient understands and agrees to plan, discharged in stable condition at this time. Final Clinical Impression(s) / ED Diagnoses Final diagnoses:  Edema, unspecified type    Rx / DC Orders ED Discharge Orders     None         [place] 06/23/21 2117    Gerhard Munch, MD 06/23/21 [phone]

## 2021-06-23 NOTE — Discharge Instructions (Signed)
As we discussed I do note peripheral edema on your exam, as well as fluid on your abdomen.  Your chest x-ray, and heart enzymes did not show any evidence of heart disease, heart failure.  I believe that your fluid retention is secondary to your hepatitis.  As we discussed I would not recommend starting you on a diuretic long-term at this time without consulting more closely with your gastroenterologist as we do not want to worsen your kidney function while we are trying to cure your liver disease.  Once your liver disease is under better control your swelling should lessen.  For your comfort at this time we will give you a single dose of diuretics.  Expect to have very frequent urination for the next 1 to 2 days.  Continue to monitor your weight, limit your fluid intake to only small amounts of water when you do feel thirsty.  Continue to follow-up with your gastroenterologist.

## 2021-06-23 NOTE — Patient Instructions (Addendum)
We will proceed with further labs and liver US to evaluate your positive Hep C testing. I will be in touch regarding these results - Reduce salt intake to <2 g per day - Can take Tylenol max of 2 g per day (650 mg q8h) for pain - Avoid NSAIDs for pain - Avoid eating raw oysters/shellfish   I have also sent a referral to behavioral health in [place], [place], someone will reach out to you regarding an appointment  Please let me know if your swelling becomes worse or you become more short of breath  Follow up in 3 months

## 2021-06-23 NOTE — ED Provider Triage Note (Signed)
Emergency Medicine Provider Triage Evaluation Note  KALUP KLIMAS , a 41 y.o. male  was evaluated in triage.  Pt complains of increased fluids, 25 pound weight gain in the last month.  Patient recently diagnosed with hepatitis C.  Patient also reports that he has been feeling short of breath.  Patient was seen by gastroenterology today, and had lab work performed, but reports that they did not place him on a diuretic.Marland Kitchen  Review of Systems  Positive: Swelling, shob Negative: Chest pain  Physical Exam  BP (!) 138/96    Pulse (!) 106    Temp 97.9 F (36.6 C) (Oral)    Resp (!) 24    SpO2 95%  Gen:   Awake, no distress   Resp:  Normal effort  MSK:   Moves extremities without difficulty  Other:  2+ edema bil LE, no accessory breath sounds noted  Medical Decision Making  Medically screening exam initiated at 7:12 PM.  Appropriate orders placed.  ANGELDEJESUS VANAMBURG was informed that the remainder of the evaluation will be completed by another provider, this initial triage assessment does not replace that evaluation, and the importance of remaining in the ED until their evaluation is complete.  Workup initiated   Anselmo Pickler, [place] 06/23/21 [id number]

## 2021-07-03 ENCOUNTER — Ambulatory Visit (HOSPITAL_COMMUNITY)
Admission: RE | Admit: 2021-07-03 | Discharge: 2021-07-03 | Disposition: A | Discharge status: Home / Self Care | Payer: 59 | Source: Ambulatory Visit | Attending: Gastroenterology | Admitting: Gastroenterology

## 2021-07-03 ENCOUNTER — Other Ambulatory Visit: Payer: Self-pay

## 2021-07-03 DIAGNOSIS — R768 Other specified abnormal immunological findings in serum: Secondary | ICD-10-CM | POA: Insufficient documentation

## 2021-07-03 DIAGNOSIS — R600 Localized edema: Secondary | ICD-10-CM | POA: Insufficient documentation

## 2021-07-14 DIAGNOSIS — R69 Illness, unspecified: Secondary | ICD-10-CM | POA: Diagnosis not present

## 2021-07-14 DIAGNOSIS — E039 Hypothyroidism, unspecified: Secondary | ICD-10-CM | POA: Diagnosis not present

## 2021-07-14 DIAGNOSIS — F1124 Opioid dependence with opioid-induced mood disorder: Secondary | ICD-10-CM | POA: Diagnosis not present

## 2021-07-14 DIAGNOSIS — E785 Hyperlipidemia, unspecified: Secondary | ICD-10-CM | POA: Diagnosis not present

## 2021-07-14 DIAGNOSIS — Z79891 Long term (current) use of opiate analgesic: Secondary | ICD-10-CM | POA: Diagnosis not present

## 2021-07-14 DIAGNOSIS — Z79899 Other long term (current) drug therapy: Secondary | ICD-10-CM | POA: Diagnosis not present

## 2021-07-17 DIAGNOSIS — R69 Illness, unspecified: Secondary | ICD-10-CM | POA: Diagnosis not present

## 2021-07-17 DIAGNOSIS — F4312 Post-traumatic stress disorder, chronic: Secondary | ICD-10-CM | POA: Diagnosis not present

## 2021-07-17 DIAGNOSIS — Z79891 Long term (current) use of opiate analgesic: Secondary | ICD-10-CM | POA: Diagnosis not present

## 2021-07-17 DIAGNOSIS — F1124 Opioid dependence with opioid-induced mood disorder: Secondary | ICD-10-CM | POA: Diagnosis not present

## 2021-07-17 DIAGNOSIS — F411 Generalized anxiety disorder: Secondary | ICD-10-CM | POA: Diagnosis not present

## 2021-07-17 DIAGNOSIS — Z79899 Other long term (current) drug therapy: Secondary | ICD-10-CM | POA: Diagnosis not present

## 2021-07-24 DIAGNOSIS — Z79891 Long term (current) use of opiate analgesic: Secondary | ICD-10-CM | POA: Diagnosis not present

## 2021-07-24 DIAGNOSIS — F411 Generalized anxiety disorder: Secondary | ICD-10-CM | POA: Diagnosis not present

## 2021-07-24 DIAGNOSIS — F4312 Post-traumatic stress disorder, chronic: Secondary | ICD-10-CM | POA: Diagnosis not present

## 2021-07-24 DIAGNOSIS — Z79899 Other long term (current) drug therapy: Secondary | ICD-10-CM | POA: Diagnosis not present

## 2021-07-24 DIAGNOSIS — R69 Illness, unspecified: Secondary | ICD-10-CM | POA: Diagnosis not present

## 2021-07-24 DIAGNOSIS — F1124 Opioid dependence with opioid-induced mood disorder: Secondary | ICD-10-CM | POA: Diagnosis not present

## 2021-07-30 LAB — CBC
HCT: 41.5 % (ref 38.5–50.0)
Hemoglobin: 14.3 g/dL (ref 13.2–17.1)
MCH: 32.4 pg (ref 27.0–33.0)
MCHC: 34.5 g/dL (ref 32.0–36.0)
MCV: 94.1 fL (ref 80.0–100.0)
MPV: 10.6 fL (ref 7.5–12.5)
Platelets: 359 10*3/uL (ref 140–400)
RBC: 4.41 10*6/uL (ref 4.20–5.80)
RDW: 13.8 % (ref 11.0–15.0)
WBC: 6.6 10*3/uL (ref 3.8–10.8)

## 2021-07-30 LAB — COMPREHENSIVE METABOLIC PANEL
AG Ratio: 1.3 (calc) (ref 1.0–2.5)
ALT: 71 U/L — ABNORMAL HIGH (ref 9–46)
AST: 43 U/L — ABNORMAL HIGH (ref 10–40)
Albumin: 4.1 g/dL (ref 3.6–5.1)
Alkaline phosphatase (APISO): 70 U/L (ref 36–130)
BUN: 9 mg/dL (ref 7–25)
CO2: 22 mmol/L (ref 20–32)
Calcium: 9.5 mg/dL (ref 8.6–10.3)
Chloride: 105 mmol/L (ref 98–110)
Creat: 0.8 mg/dL (ref 0.60–1.29)
Globulin: 3.2 g/dL (calc) (ref 1.9–3.7)
Glucose, Bld: 97 mg/dL (ref 65–139)
Potassium: 4.4 mmol/L (ref 3.5–5.3)
Sodium: 137 mmol/L (ref 135–146)
Total Bilirubin: 0.3 mg/dL (ref 0.2–1.2)
Total Protein: 7.3 g/dL (ref 6.1–8.1)

## 2021-07-30 LAB — HCV RNA NS5A DRUG RESISTANCE

## 2021-07-30 LAB — HCV RNA, QUANT REAL-TIME PCR W/REFLEX
HCV RNA, PCR, QN (Log): 6.23 LogIU/mL — ABNORMAL HIGH
HCV RNA, PCR, QN: 1710000 IU/mL — ABNORMAL HIGH

## 2021-07-30 LAB — PROTIME-INR
INR: 0.9
Prothrombin Time: 9.6 s (ref 9.0–11.5)

## 2021-07-30 LAB — HIV ANTIBODY (ROUTINE TESTING W REFLEX): HIV 1&2 Ab, 4th Generation: NONREACTIVE

## 2021-07-30 LAB — HCV RNA,LIPA RFLX NS5A DRUG RESIST

## 2021-07-30 LAB — HEPATITIS B CORE ANTIBODY, TOTAL: Hep B Core Total Ab: NONREACTIVE

## 2021-07-31 DIAGNOSIS — Z5181 Encounter for therapeutic drug level monitoring: Secondary | ICD-10-CM | POA: Diagnosis not present

## 2021-07-31 DIAGNOSIS — F4312 Post-traumatic stress disorder, chronic: Secondary | ICD-10-CM | POA: Diagnosis not present

## 2021-07-31 DIAGNOSIS — R69 Illness, unspecified: Secondary | ICD-10-CM | POA: Diagnosis not present

## 2021-07-31 DIAGNOSIS — F1124 Opioid dependence with opioid-induced mood disorder: Secondary | ICD-10-CM | POA: Diagnosis not present

## 2021-07-31 DIAGNOSIS — Z79899 Other long term (current) drug therapy: Secondary | ICD-10-CM | POA: Diagnosis not present

## 2021-07-31 DIAGNOSIS — F411 Generalized anxiety disorder: Secondary | ICD-10-CM | POA: Diagnosis not present

## 2021-08-05 DIAGNOSIS — R69 Illness, unspecified: Secondary | ICD-10-CM | POA: Diagnosis not present

## 2021-08-07 ENCOUNTER — Other Ambulatory Visit: Payer: Self-pay

## 2021-08-07 ENCOUNTER — Ambulatory Visit
Admission: RE | Admit: 2021-08-07 | Discharge: 2021-08-07 | Disposition: A | Discharge status: Home / Self Care | Payer: 59 | Source: Ambulatory Visit | Attending: Family Medicine | Admitting: Family Medicine

## 2021-08-07 ENCOUNTER — Ambulatory Visit (INDEPENDENT_AMBULATORY_CARE_PROVIDER_SITE_OTHER): Payer: 59

## 2021-08-07 VITALS — BP 119/84 | HR 103 | Temp 97.9°F | Resp 20

## 2021-08-07 DIAGNOSIS — R600 Localized edema: Secondary | ICD-10-CM

## 2021-08-07 DIAGNOSIS — R0602 Shortness of breath: Secondary | ICD-10-CM

## 2021-08-07 DIAGNOSIS — R079 Chest pain, unspecified: Secondary | ICD-10-CM

## 2021-08-07 DIAGNOSIS — M545 Low back pain, unspecified: Secondary | ICD-10-CM

## 2021-08-07 DIAGNOSIS — K219 Gastro-esophageal reflux disease without esophagitis: Secondary | ICD-10-CM | POA: Diagnosis not present

## 2021-08-07 MED ORDER — FUROSEMIDE 20 MG PO TABS: 20.0000 mg | ORAL_TABLET | Freq: Every day | ORAL | 0 refills | Status: DC

## 2021-08-07 MED ORDER — SUCRALFATE 1 G PO TABS: 1.0000 g | ORAL_TABLET | Freq: Three times a day (TID) | ORAL | 0 refills | Status: DC | PRN

## 2021-08-07 MED ORDER — METHOCARBAMOL 500 MG PO TABS: 500.0000 mg | ORAL_TABLET | Freq: Two times a day (BID) | ORAL | 0 refills | Status: DC | PRN

## 2021-08-07 MED ORDER — PANTOPRAZOLE SODIUM 40 MG PO TBEC: 40.0000 mg | DELAYED_RELEASE_TABLET | Freq: Every day | ORAL | 0 refills | Status: DC

## 2021-08-07 NOTE — ED Triage Notes (Signed)
Pt states that both feet and both legs have been swollen for about a month ? ?Pt states he is also having lower back pain if he stands for more than 5 minutes at a time ? ?Denies Meds ? ?Denies fever ?

## 2021-08-11 NOTE — ED Provider Notes (Signed)
?[facility] ? ? ? ?CSN: [id number] ?Arrival date & time: 08/07/21  1749 ? ? ?  ? ?History   ?Chief Complaint ?Chief Complaint  ?Patient presents with  ? Back Pain  ?  Swelling in feet and legs and back pain  ? ? ?HPI ?Andre Lynch is a 41 y.o. male.  ? ?Presenting today with about a month of b/l UE and LE edema, worse in the LEs. He notes for about the same duration has been having intermittent SOB, not always associated with exertional activities. This has not worsened and is not associated with CP, diaphoresis, N/V, palpitations. Also having worsening acute on chronic low back pain if standing for long periods and having to take frequent breaks. States the pain is aching in nature and across both sides of his low back. Denies injury, leg weakness or numbness, saddle anesthesia, bowel or bladder incontinence, fever. States he was seen in the ED about a month ago for the leg edema, workup notable for hepatitis C diagnosis for which he just initiated care with a GI specialist who will hopefully be managing his treatment in the near future. So far not trying anything OTC for the edema, trying NSAIDs for the back pain but notes it causes stomach irritation for him (hx of gastric ulcer). PMHx significant for hx of substance abuse, seizures, GAD and newly diagnosed Hep C.  ? ? ?Past Medical History:  ?Diagnosis Date  ? Anxiety   ? Back pain   ? Chronic knee pain   ? Depression   ? Gastric ulcer   ? Hepatitis-C   ? 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 methadone tx  ? Kidney stones   ? RLS (restless legs syndrome)   ? Seizures ([place])   ? last one early 2018  ? Substance abuse ([place])   ? cocaine, benzos, marijuana, benzos, opiates  ? ? ?Patient Active Problem List  ? Diagnosis Date Noted  ? Hepatitis C antibody positive in blood 06/23/2021  ? Localized edema 06/23/2021  ? 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 Mental health disorder   ? Acute encephalopathy 06/25/2020  ? Substance use disorder  06/25/2020  ? Chronic pain syndrome 06/25/2020  ? Benzodiazepine withdrawal without complication ([place])   ? Substance withdrawal, with delirium ([place]) 06/24/2020  ? Seizures ([place]) 06/24/2020  ? H/O: substance abuse ([place]) 07/28/2012  ? GAD (generalized anxiety disorder) 07/28/2012  ? ? ?Past Surgical History:  ?Procedure Laterality Date  ? HERNIA REPAIR    ? WISDOM TOOTH EXTRACTION    ? ? ? ? ? ?Home Medications   ? ?Prior to Admission medications   ?Medication Sig Start Date End Date Taking? Authorizing Provider  ?furosemide (LASIX) 20 MG tablet Take 1 tablet (20 mg total) by mouth daily. 08/07/21  Yes Volney American, PA-C  ?pantoprazole (PROTONIX) 40 MG tablet Take 1 tablet (40 mg total) by mouth daily. 08/07/21  Yes Volney American, PA-C  ?sucralfate (CARAFATE) 1 g tablet Take 1 tablet (1 g total) by mouth 3 (three) times daily as needed. May dissolve 1 tablet into a glass of water and drink as needed to help coat your stomach 08/07/21  Yes Volney American, PA-C  ?amitriptyline (ELAVIL) 150 MG tablet Take 150 mg by mouth at bedtime. 05/21/21   [provider]  ?ARIPiprazole (ABILIFY) 5 MG tablet Take 5 mg by mouth daily.    [provider]  ?buprenorphine (SUBUTEX) 8 MG SUBL SL tablet 8 mg 2 (  two) times a day. 10/26/16   [provider]  ?clonazePAM (KLONOPIN) 1 MG tablet Take 1 mg by mouth 3 (three) times daily as needed. 05/17/20   [provider]  ?gabapentin (NEURONTIN) 600 MG tablet Take 600 mg by mouth 3 (three) times daily.    [provider]  ?methocarbamol (ROBAXIN) 500 MG tablet Take 1 tablet (500 mg total) by mouth 2 (two) times daily as needed for muscle spasms. May cause drowsiness, do not drive while taking this medication 08/07/21   Volney American, PA-C  ?mirtazapine (REMERON) 7.5 MG tablet Take 7.5 mg by mouth at bedtime. 06/11/21   [provider]  ?propranolol (INDERAL) 20 MG tablet Take 10-20 mg by mouth 2 (two) times  daily as needed. 06/11/21   [provider]  ? ? ?Family History ?Family History  ?Problem Relation Age of Onset  ? Cancer Mother   ?     breast cancer survivor  ? ? ?Social History ?Social History  ? ?Tobacco Use  ? Smoking status: Every Day  ?  Packs/day: 0.50  ?  Years: 20.00  ?  Pack years: 10.00  ?  Types: Cigarettes  ? Smokeless tobacco: Never  ?Vaping Use  ? Vaping Use: Some days  ?Substance Use Topics  ? Alcohol use: Not Currently  ? Drug use: Not Currently  ? ? ? ?Allergies   ?Ibuprofen and Tylenol [acetaminophen] ? ? ?Review of Systems ?Review of Systems ?PER HPI ? ?Physical Exam ?Triage Vital Signs ?ED Triage Vitals  ?Enc Vitals Group  ?   BP 08/07/21 1813 119/84  ?   Pulse Rate 08/07/21 1813 (!) 103  ?   Resp 08/07/21 1813 20  ?   Temp 08/07/21 1813 97.9 ?F (36.6 ?C)  ?   Temp Source 08/07/21 1813 Oral  ?   SpO2 08/07/21 1813 91 %  ?   Weight --   ?   Height --   ?   Head Circumference --   ?   Peak Flow --   ?   Pain Score 08/07/21 1816 8  ?   Pain Loc --   ?   Pain Edu? --   ?   Excl. in [place]? --   ? ?No data found. ? ?Updated Vital Signs ?BP 119/84 (BP Location: Right Arm)   Pulse (!) 103   Temp 97.9 ?F (36.6 ?C) (Oral)   Resp 20   SpO2 91%  ? ?Visual Acuity ?Right Eye Distance:   ?Left Eye Distance:   ?Bilateral Distance:   ? ?Right Eye Near:   ?Left Eye Near:    ?Bilateral Near:    ? ?Physical Exam ?Vitals and nursing note reviewed.  ?Constitutional:   ?   Appearance: Normal appearance.  ?HENT:  ?   Head: Atraumatic.  ?   Mouth/Throat:  ?   Mouth: Mucous membranes are moist.  ?Eyes:  ?   Extraocular Movements: Extraocular movements intact.  ?   Conjunctiva/sclera: Conjunctivae normal.  ?Cardiovascular:  ?   Rate and Rhythm: Normal rate and regular rhythm.  ?Pulmonary:  ?   Effort: Pulmonary effort is normal.  ?   Breath sounds: Normal breath sounds. No wheezing or rales.  ?Abdominal:  ?   General: Bowel sounds are normal. There is no distension.  ?   Palpations: Abdomen is soft.  ?    Tenderness: There is no abdominal tenderness. There is no guarding.  ?Musculoskeletal:     ?   General:  Swelling present. Normal range of motion.  ?   Cervical back: Normal range of motion and neck supple.  ?   Comments: B/l lower legs edematous, erythematous symmetrically. Mildly ttp ?No midline spinal ttp diffusely. Neg SLR  ?Skin: ?   General: Skin is warm and dry.  ?   Findings: Erythema present.  ?Neurological:  ?   General: No focal deficit present.  ?   Mental Status: He is oriented to person, place, and time.  ?   Comments: All 4 extremities neurovascularly intact  ?Psychiatric:     ?   Mood and Affect: Mood normal.     ?   Thought Content: Thought content normal.     ?   Judgment: Judgment normal.  ? ? ? ?UC Treatments / Results  ?Labs ?(all labs ordered are listed, but only abnormal results are displayed) ?Labs Reviewed - No data to display ? ?EKG ? ? ?Radiology ?No results found. ? ?Procedures ?Procedures (including critical care time) ? ?Medications Ordered in UC ?Medications - No data to display ? ?Initial Impression / Assessment and Plan / UC Course  ?I have reviewed the triage vital signs and the nursing notes. ? ?Pertinent labs & imaging results that were available during my care of the patient were reviewed by me and considered in my medical decision making (see chart for details). ? ?  ? ?Trial short course of lasix for LE edema. CXR neg for pleural effusion or other acute abnormality, declines EKG for further eval of SOB. Discussed compression, leg elevation, DASH diet.  ?WIll treat low back pain with robaxin, NSAIDs (add carafate and PPI for GI protection). Strict ED precautions reviewed, f/u with PCP as soon as possible.  ? ?Final Clinical Impressions(s) / UC Diagnoses  ? ?Final diagnoses:  ?Leg edema  ?Acute bilateral low back pain without sciatica  ?Gastroesophageal reflux disease, unspecified whether esophagitis present  ? ?Discharge Instructions   ?None ?  ? ?ED Prescriptions   ? ?  Medication Sig Dispense Auth. Provider  ? furosemide (LASIX) 20 MG tablet Take 1 tablet (20 mg total) by mouth daily. 7 tablet Volney American, [place]  ? sucralfate (CARAFATE) 1 g tablet Take 1 tablet (1 g total) by mouth 3

## 2021-08-14 DIAGNOSIS — F4312 Post-traumatic stress disorder, chronic: Secondary | ICD-10-CM | POA: Diagnosis not present

## 2021-08-14 DIAGNOSIS — F319 Bipolar disorder, unspecified: Secondary | ICD-10-CM | POA: Diagnosis not present

## 2021-08-14 DIAGNOSIS — Z5181 Encounter for therapeutic drug level monitoring: Secondary | ICD-10-CM | POA: Diagnosis not present

## 2021-08-14 DIAGNOSIS — F1124 Opioid dependence with opioid-induced mood disorder: Secondary | ICD-10-CM | POA: Diagnosis not present

## 2021-08-14 DIAGNOSIS — Z79899 Other long term (current) drug therapy: Secondary | ICD-10-CM | POA: Diagnosis not present

## 2021-08-14 DIAGNOSIS — R69 Illness, unspecified: Secondary | ICD-10-CM | POA: Diagnosis not present

## 2021-08-14 DIAGNOSIS — F411 Generalized anxiety disorder: Secondary | ICD-10-CM | POA: Diagnosis not present

## 2021-08-24 DIAGNOSIS — Z79899 Other long term (current) drug therapy: Secondary | ICD-10-CM | POA: Diagnosis not present

## 2021-08-24 DIAGNOSIS — Z5181 Encounter for therapeutic drug level monitoring: Secondary | ICD-10-CM | POA: Diagnosis not present

## 2021-08-24 DIAGNOSIS — F411 Generalized anxiety disorder: Secondary | ICD-10-CM | POA: Diagnosis not present

## 2021-08-24 DIAGNOSIS — R69 Illness, unspecified: Secondary | ICD-10-CM | POA: Diagnosis not present

## 2021-08-24 DIAGNOSIS — F1124 Opioid dependence with opioid-induced mood disorder: Secondary | ICD-10-CM | POA: Diagnosis not present

## 2021-08-24 DIAGNOSIS — F4312 Post-traumatic stress disorder, chronic: Secondary | ICD-10-CM | POA: Diagnosis not present

## 2021-08-24 DIAGNOSIS — F319 Bipolar disorder, unspecified: Secondary | ICD-10-CM | POA: Diagnosis not present

## 2021-08-25 ENCOUNTER — Ambulatory Visit (INDEPENDENT_AMBULATORY_CARE_PROVIDER_SITE_OTHER): Payer: 59 | Admitting: Gastroenterology

## 2021-09-07 DIAGNOSIS — Z5181 Encounter for therapeutic drug level monitoring: Secondary | ICD-10-CM | POA: Diagnosis not present

## 2021-09-07 DIAGNOSIS — F4312 Post-traumatic stress disorder, chronic: Secondary | ICD-10-CM | POA: Diagnosis not present

## 2021-09-07 DIAGNOSIS — F319 Bipolar disorder, unspecified: Secondary | ICD-10-CM | POA: Diagnosis not present

## 2021-09-07 DIAGNOSIS — Z79899 Other long term (current) drug therapy: Secondary | ICD-10-CM | POA: Diagnosis not present

## 2021-09-07 DIAGNOSIS — F411 Generalized anxiety disorder: Secondary | ICD-10-CM | POA: Diagnosis not present

## 2021-09-07 DIAGNOSIS — F1124 Opioid dependence with opioid-induced mood disorder: Secondary | ICD-10-CM | POA: Diagnosis not present

## 2021-09-07 DIAGNOSIS — R69 Illness, unspecified: Secondary | ICD-10-CM | POA: Diagnosis not present

## 2021-09-16 ENCOUNTER — Other Ambulatory Visit (HOSPITAL_COMMUNITY): Payer: Self-pay

## 2021-09-16 ENCOUNTER — Telehealth: Payer: Self-pay

## 2021-09-16 NOTE — Telephone Encounter (Signed)
[facility] Patient Advocate Encounter ? ?Insurance verification completed.   ? ?The patient is insured through [employer]. ? ?Medication will need a PA.  ?Medication will not be able to be filled at [facility]. ? ?We will continue to follow to see if copay assistance is needed. ? ?Clearance Coots, CPhT ?Specialty Pharmacy Patient Advocate ?[facility] ?Phone: [phone] ?Fax:  [phone]  ?

## 2021-09-17 ENCOUNTER — Ambulatory Visit (INDEPENDENT_AMBULATORY_CARE_PROVIDER_SITE_OTHER): Payer: 59 | Admitting: Infectious Disease

## 2021-09-17 ENCOUNTER — Other Ambulatory Visit: Payer: Self-pay

## 2021-09-17 ENCOUNTER — Encounter: Payer: Self-pay | Admitting: Infectious Disease

## 2021-09-17 VITALS — BP 114/79 | HR 75 | Temp 97.9°F | Ht 69.0 in | Wt 197.0 lb

## 2021-09-17 DIAGNOSIS — B182 Chronic viral hepatitis C: Secondary | ICD-10-CM | POA: Insufficient documentation

## 2021-09-17 DIAGNOSIS — F1990 Other psychoactive substance use, unspecified, uncomplicated: Secondary | ICD-10-CM

## 2021-09-17 DIAGNOSIS — M545 Low back pain, unspecified: Secondary | ICD-10-CM | POA: Diagnosis not present

## 2021-09-17 DIAGNOSIS — R109 Unspecified abdominal pain: Secondary | ICD-10-CM | POA: Diagnosis not present

## 2021-09-17 DIAGNOSIS — R600 Localized edema: Secondary | ICD-10-CM | POA: Diagnosis not present

## 2021-09-17 DIAGNOSIS — G8929 Other chronic pain: Secondary | ICD-10-CM | POA: Diagnosis not present

## 2021-09-17 DIAGNOSIS — R69 Illness, unspecified: Secondary | ICD-10-CM | POA: Diagnosis not present

## 2021-09-17 HISTORY — DX: Other psychoactive substance use, unspecified, uncomplicated: F19.90

## 2021-09-17 HISTORY — DX: Localized edema: R60.0

## 2021-09-17 HISTORY — DX: Chronic viral hepatitis C: B18.2

## 2021-09-17 NOTE — Progress Notes (Addendum)
? ?Subjective:  ?Reason for infectious disease consult chronic hepatitis C without hepatic coma. ? ? ? ?Requesting physician: Lauretta GrillLauren Dinsbeer FNP ? ? Patient ID: Andre BerlinJason M Hayward, male    DOB: 07/22/1980, 41 y.o.   MRN: [id number] ? ?HPI ? 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 ? ?He returned in had tested positive for hepatitis C which he believes he acquired through sex as he is adamant he has not been using IV drugs recently and that he tested negative while in the treatment facility. ? ?He was seen in [place] by gastroenterology and labs have shown him to have  genotype 1a. Elastography showed no evidence of cirrhosis. ? ?Apparently since he has been back from [place] he has had LE edema and abdominal pain and distention as well as lower back pain ? ?He denies IVDU recently. He is rx adderal for ADHD. ? ? ?Past Medical History:  ?Diagnosis Date  ? Anxiety   ? Back pain   ? Chronic hepatitis C without hepatic coma ([facility]) 09/17/2021  ? Chronic knee pain   ? Depression   ? Gastric ulcer   ? Hepatitis-C   ? History of narcotic addiction ([facility])   ? methadone tx  ? IVDU (intravenous drug user) 09/17/2021  ? Kidney stones   ? Lower extremity edema 09/17/2021  ? RLS (restless legs syndrome)   ? Seizures ([facility])   ? last one early 2018  ? Substance abuse ([facility])   ? cocaine, benzos, marijuana, benzos, opiates  ? ? ?Past Surgical History:  ?Procedure Laterality Date  ? HERNIA REPAIR    ? WISDOM TOOTH EXTRACTION    ? ? ?Family History  ?Problem Relation Age of Onset  ? Cancer Mother   ?     breast cancer survivor  ? ? ?  ?Social History  ? ?Socioeconomic History  ? Marital status: Single  ?  Spouse name: Not on file  ? Number of children: Not on file  ? Years of education: Not on file  ? Highest education level: Not on file   ?Occupational History  ? Occupation: unemployed  ?Tobacco Use  ? Smoking status: Every Day  ?  Packs/day: 0.50  ?  Years: 20.00  ?  Pack years: 10.00  ?  Types: Cigarettes  ? Smokeless tobacco: Never  ?Vaping Use  ? Vaping Use: Some days  ?Substance and Sexual Activity  ? Alcohol use: Not Currently  ? Drug use: Not Currently  ? Sexual activity: Not Currently  ?  Birth control/protection: None  ?Other Topics Concern  ? Not on file  ?Social History Narrative  ? Not on file  ? ?Social Determinants of Health  ? ?Financial Resource Strain: Not on file  ?Food Insecurity: Not on file  ?Transportation Needs: Not on file  ?Physical Activity: Not on file  ?Stress: Not on file  ?Social Connections: Not on file  ? ? ?Allergies  ?Allergen Reactions  ? Ibuprofen Other (See Comments)  ?  Cannot take due to stomach ulcers  ? Tylenol [Acetaminophen] Other (See Comments)  ?  Cannot take due to stomach ulcers  ? ? ? ?Current Outpatient Medications:  ?  buprenorphine (SUBUTEX) 8 MG SUBL SL tablet, 8 mg 2 (two) times a day., Disp: , Rfl:  ?  clonazePAM (KLONOPIN) 1 MG  tablet, Take 1 mg by mouth 3 (three) times daily as needed., Disp: , Rfl:  ?  gabapentin (NEURONTIN) 600 MG tablet, Take 600 mg by mouth 3 (three) times daily., Disp: , Rfl:  ?  methylphenidate 27 MG PO CR tablet, Take 27 mg by mouth every morning., Disp: , Rfl:  ?  pantoprazole (PROTONIX) 40 MG tablet, Take 1 tablet (40 mg total) by mouth daily., Disp: 30 tablet, Rfl: 0 ?  propranolol (INDERAL) 20 MG tablet, Take 10-20 mg by mouth 2 (two) times daily as needed., Disp: , Rfl:  ?  amitriptyline (ELAVIL) 150 MG tablet, Take 150 mg by mouth at bedtime. (Patient not taking: Reported on 09/17/2021), Disp: , Rfl:  ?  ARIPiprazole (ABILIFY) 5 MG tablet, Take 5 mg by mouth daily. (Patient not taking: Reported on 09/17/2021), Disp: , Rfl:  ?  furosemide (LASIX) 20 MG tablet, Take 1 tablet (20 mg total) by mouth daily. (Patient not taking: Reported on 09/17/2021), Disp: 7 tablet, Rfl:  0 ?  methocarbamol (ROBAXIN) 500 MG tablet, Take 1 tablet (500 mg total) by mouth 2 (two) times daily as needed for muscle spasms. May cause drowsiness, do not drive while taking this medication (Patient not taking: Reported on 09/17/2021), Disp: 20 tablet, Rfl: 0 ?  mirtazapine (REMERON) 7.5 MG tablet, Take 7.5 mg by mouth at bedtime. (Patient not taking: Reported on 09/17/2021), Disp: , Rfl:  ?  sucralfate (CARAFATE) 1 g tablet, Take 1 tablet (1 g total) by mouth 3 (three) times daily as needed. May dissolve 1 tablet into a glass of water and drink as needed to help coat your stomach (Patient not taking: Reported on 09/17/2021), Disp: 30 tablet, Rfl: 0 ? ? ?Review of Systems  ?Constitutional:  Negative for activity change, appetite change, chills, diaphoresis, fatigue, fever and unexpected weight change.  ?HENT:  Negative for congestion, rhinorrhea, sinus pressure, sneezing, sore throat and trouble swallowing.   ?Eyes:  Negative for photophobia and visual disturbance.  ?Respiratory:  Negative for cough, chest tightness, shortness of breath, wheezing and stridor.   ?Cardiovascular:  Positive for leg swelling. Negative for chest pain and palpitations.  ?Gastrointestinal:  Positive for abdominal distention and abdominal pain. Negative for anal bleeding, blood in stool, constipation, diarrhea, nausea and vomiting.  ?Genitourinary:  Negative for difficulty urinating, dysuria, flank pain and hematuria.  ?Musculoskeletal:  Positive for back pain. Negative for arthralgias, gait problem, joint swelling and myalgias.  ?Skin:  Negative for color change, pallor, rash and wound.  ?Neurological:  Negative for dizziness, tremors, weakness and light-headedness.  ?Hematological:  Negative for adenopathy. Does not bruise/bleed easily.  ?Psychiatric/Behavioral:  Negative for agitation, behavioral problems, confusion, decreased concentration, dysphoric mood and sleep disturbance.   ? ?   ?Objective:  ? Physical Exam ?Constitutional:   ?    Appearance: He is well-developed.  ?HENT:  ?   Head: Normocephalic and atraumatic.  ?Eyes:  ?   Conjunctiva/sclera: Conjunctivae normal.  ?Cardiovascular:  ?   Rate and Rhythm: Regular rhythm. Bradycardia present.  ?   Heart sounds: No murmur heard. ?  No friction rub. No gallop.  ?Pulmonary:  ?   Effort: Pulmonary effort is normal. No respiratory distress.  ?   Breath sounds: Normal breath sounds. No stridor. No wheezing.  ?Abdominal:  ?   General: There is no distension.  ?   Palpations: Abdomen is soft.  ?   Tenderness: There is generalized abdominal tenderness.  ?Musculoskeletal:     ?   General:  No tenderness. Normal range of motion.  ?   Cervical back: Normal range of motion and neck supple.  ?Skin: ?   General: Skin is warm and dry.  ?   Coloration: Skin is not pale.  ?   Findings: No erythema or rash.  ?Neurological:  ?   General: No focal deficit present.  ?   Mental Status: He is alert and oriented to person, place, and time.  ?Psychiatric:     ?   Mood and Affect: Mood normal.     ?   Behavior: Behavior normal.     ?   Thought Content: Thought content normal.     ?   Judgment: Judgment normal.  ? ? ? ? ? ?   ?Assessment & Plan:  ? ?Chronic hepatitis c without hepatic coma: ? ?I will check fibrosure and also recent CMP PT/INT today, CBC w diff, hep B sag, hep a total ab ? ?I would like to start antiviral for him but would like some of this data back in the interim ? ?It would be highly unusual for someone to develop advanced liver disease in a fw months but it is odd hearing about his LE edema, and abdominal pain ? ?Le edema: and abdominal pain: checking BNP as wll. Could he have hx of right sided endocarditis? ? ?Lower back pain: plain films were unremarkable for new changes ? ?I am getting an MRI of the L spine with and without contrast ? ?Hx of IVDU: I will run tox sceen on him today ? ?I spent 82 minutes with the patient including than 50% of the time in face to face counseling of the patient and his  Mother re hepatitis C, the above problems, personally reviewing elastography , plain films. along with review of medical records in preparation for the visit and during the visit and in coordination of  h

## 2021-09-21 ENCOUNTER — Encounter (INDEPENDENT_AMBULATORY_CARE_PROVIDER_SITE_OTHER): Payer: Self-pay

## 2021-09-21 ENCOUNTER — Ambulatory Visit (INDEPENDENT_AMBULATORY_CARE_PROVIDER_SITE_OTHER): Payer: 59 | Admitting: Gastroenterology

## 2021-09-21 DIAGNOSIS — F1124 Opioid dependence with opioid-induced mood disorder: Secondary | ICD-10-CM | POA: Diagnosis not present

## 2021-09-21 DIAGNOSIS — Z79899 Other long term (current) drug therapy: Secondary | ICD-10-CM | POA: Diagnosis not present

## 2021-09-21 DIAGNOSIS — Z5181 Encounter for therapeutic drug level monitoring: Secondary | ICD-10-CM | POA: Diagnosis not present

## 2021-09-22 ENCOUNTER — Encounter (INDEPENDENT_AMBULATORY_CARE_PROVIDER_SITE_OTHER): Payer: Self-pay

## 2021-09-22 LAB — DM TEMPLATE

## 2021-09-22 LAB — DRUG MONITOR, PANEL 1, SCREEN, URINE

## 2021-09-23 LAB — LIVER FIBROSIS, FIBROTEST-ACTITEST
ALT: 28 U/L (ref 9–46)
Alpha-2-Macroglobulin: 184 mg/dL (ref 106–279)
Apolipoprotein A1: 103 mg/dL (ref 94–176)
Bilirubin: 0.3 mg/dL (ref 0.2–1.2)
Fibrosis Score: 0.12
GGT: 26 U/L (ref 3–95)
Haptoglobin: 264 mg/dL — ABNORMAL HIGH (ref 43–212)
Necroinflammat ACT Score: 0.1
Reference ID: 4335980

## 2021-09-23 LAB — COMPREHENSIVE METABOLIC PANEL
AG Ratio: 1.3 (calc) (ref 1.0–2.5)
ALT: 27 U/L (ref 9–46)
AST: 20 U/L (ref 10–40)
Albumin: 3.7 g/dL (ref 3.6–5.1)
Alkaline phosphatase (APISO): 59 U/L (ref 36–130)
BUN: 9 mg/dL (ref 7–25)
CO2: 25 mmol/L (ref 20–32)
Calcium: 9.1 mg/dL (ref 8.6–10.3)
Chloride: 105 mmol/L (ref 98–110)
Creat: 0.77 mg/dL (ref 0.60–1.29)
Globulin: 2.9 g/dL (calc) (ref 1.9–3.7)
Glucose, Bld: 107 mg/dL — ABNORMAL HIGH (ref 65–99)
Potassium: 4 mmol/L (ref 3.5–5.3)
Sodium: 139 mmol/L (ref 135–146)
Total Bilirubin: 0.3 mg/dL (ref 0.2–1.2)
Total Protein: 6.6 g/dL (ref 6.1–8.1)

## 2021-09-23 LAB — CBC WITH DIFFERENTIAL/PLATELET
Absolute Monocytes: 459 cells/uL (ref 200–950)
Basophils Absolute: 28 cells/uL (ref 0–200)
Basophils Relative: 0.5 %
Eosinophils Absolute: 291 cells/uL (ref 15–500)
Eosinophils Relative: 5.2 %
HCT: 40.1 % (ref 38.5–50.0)
Hemoglobin: 13.6 g/dL (ref 13.2–17.1)
Lymphs Abs: 2363 cells/uL (ref 850–3900)
MCH: 32.6 pg (ref 27.0–33.0)
MCHC: 33.9 g/dL (ref 32.0–36.0)
MCV: 96.2 fL (ref 80.0–100.0)
MPV: 10.6 fL (ref 7.5–12.5)
Monocytes Relative: 8.2 %
Neutro Abs: 2458 cells/uL (ref 1500–7800)
Neutrophils Relative %: 43.9 %
Platelets: 299 10*3/uL (ref 140–400)
RBC: 4.17 10*6/uL — ABNORMAL LOW (ref 4.20–5.80)
RDW: 11.5 % (ref 11.0–15.0)
Total Lymphocyte: 42.2 %
WBC: 5.6 10*3/uL (ref 3.8–10.8)

## 2021-09-23 LAB — PREALBUMIN: Prealbumin: 19 mg/dL — ABNORMAL LOW (ref 21–43)

## 2021-09-23 LAB — SEDIMENTATION RATE: Sed Rate: 38 mm/h — ABNORMAL HIGH (ref 0–15)

## 2021-09-23 LAB — C-REACTIVE PROTEIN: CRP: 15.6 mg/L — ABNORMAL HIGH (ref <8.0)

## 2021-09-23 LAB — HEPATITIS A ANTIBODY, TOTAL: Hepatitis A AB,Total: NONREACTIVE

## 2021-09-23 LAB — TSH+FREE T4: TSH W/REFLEX TO FT4: 2.58 mIU/L (ref 0.40–4.50)

## 2021-09-23 LAB — HEPATITIS B SURFACE ANTIBODY, QUANTITATIVE: Hep B S AB Quant (Post): <5 m[IU]/mL — ABNORMAL LOW (ref >=10)

## 2021-09-23 LAB — BRAIN NATRIURETIC PEPTIDE: Brain Natriuretic Peptide: 20 pg/mL (ref <100)

## 2021-09-29 LAB — DM TEMPLATE

## 2021-09-29 LAB — DRUG MONITOR, PANEL 1, SCREEN, URINE
Amphetamines: POSITIVE ng/mL — AB (ref <500)
Barbiturates: NEGATIVE ng/mL (ref <300)
Benzodiazepines: POSITIVE ng/mL — AB (ref <100)
Cocaine Metabolite: NEGATIVE ng/mL (ref <150)
Creatinine: 231.2 mg/dL (ref >=20.0)
Marijuana Metabolite: NEGATIVE ng/mL (ref <20)
Methadone Metabolite: NEGATIVE ng/mL (ref <100)
Opiates: NEGATIVE ng/mL (ref <100)
Oxidant: NEGATIVE ug/mL (ref <200)
Oxycodone: NEGATIVE ng/mL (ref <100)
Phencyclidine: NEGATIVE ng/mL (ref <25)
pH: 5.7 (ref 4.5–9.0)

## 2021-10-05 DIAGNOSIS — Z79899 Other long term (current) drug therapy: Secondary | ICD-10-CM | POA: Diagnosis not present

## 2021-10-05 DIAGNOSIS — F411 Generalized anxiety disorder: Secondary | ICD-10-CM | POA: Diagnosis not present

## 2021-10-05 DIAGNOSIS — F1124 Opioid dependence with opioid-induced mood disorder: Secondary | ICD-10-CM | POA: Diagnosis not present

## 2021-10-05 DIAGNOSIS — F4312 Post-traumatic stress disorder, chronic: Secondary | ICD-10-CM | POA: Diagnosis not present

## 2021-10-05 DIAGNOSIS — R69 Illness, unspecified: Secondary | ICD-10-CM | POA: Diagnosis not present

## 2021-10-05 DIAGNOSIS — Z5181 Encounter for therapeutic drug level monitoring: Secondary | ICD-10-CM | POA: Diagnosis not present

## 2021-10-05 DIAGNOSIS — F319 Bipolar disorder, unspecified: Secondary | ICD-10-CM | POA: Diagnosis not present

## 2021-10-06 ENCOUNTER — Encounter (HOSPITAL_COMMUNITY): Payer: Self-pay

## 2021-10-06 ENCOUNTER — Ambulatory Visit (HOSPITAL_COMMUNITY): Payer: 59

## 2021-10-12 DIAGNOSIS — Z79899 Other long term (current) drug therapy: Secondary | ICD-10-CM | POA: Diagnosis not present

## 2021-10-12 DIAGNOSIS — Z5181 Encounter for therapeutic drug level monitoring: Secondary | ICD-10-CM | POA: Diagnosis not present

## 2021-10-12 DIAGNOSIS — F411 Generalized anxiety disorder: Secondary | ICD-10-CM | POA: Diagnosis not present

## 2021-10-12 DIAGNOSIS — F1124 Opioid dependence with opioid-induced mood disorder: Secondary | ICD-10-CM | POA: Diagnosis not present

## 2021-10-12 DIAGNOSIS — F319 Bipolar disorder, unspecified: Secondary | ICD-10-CM | POA: Diagnosis not present

## 2021-10-12 DIAGNOSIS — F4312 Post-traumatic stress disorder, chronic: Secondary | ICD-10-CM | POA: Diagnosis not present

## 2021-10-12 DIAGNOSIS — R69 Illness, unspecified: Secondary | ICD-10-CM | POA: Diagnosis not present

## 2021-10-14 ENCOUNTER — Encounter: Payer: Self-pay | Admitting: Infectious Disease

## 2021-10-14 ENCOUNTER — Telehealth: Payer: Self-pay

## 2021-10-14 ENCOUNTER — Ambulatory Visit (INDEPENDENT_AMBULATORY_CARE_PROVIDER_SITE_OTHER): Payer: 59 | Admitting: Infectious Disease

## 2021-10-14 ENCOUNTER — Other Ambulatory Visit (HOSPITAL_COMMUNITY): Payer: Self-pay

## 2021-10-14 ENCOUNTER — Other Ambulatory Visit: Payer: Self-pay

## 2021-10-14 VITALS — BP 119/78 | HR 50 | Temp 98.0°F | Wt 195.0 lb

## 2021-10-14 DIAGNOSIS — G8929 Other chronic pain: Secondary | ICD-10-CM | POA: Insufficient documentation

## 2021-10-14 DIAGNOSIS — L304 Erythema intertrigo: Secondary | ICD-10-CM | POA: Diagnosis not present

## 2021-10-14 DIAGNOSIS — R69 Illness, unspecified: Secondary | ICD-10-CM | POA: Diagnosis not present

## 2021-10-14 DIAGNOSIS — M545 Low back pain, unspecified: Secondary | ICD-10-CM

## 2021-10-14 DIAGNOSIS — N644 Mastodynia: Secondary | ICD-10-CM | POA: Diagnosis not present

## 2021-10-14 DIAGNOSIS — F1990 Other psychoactive substance use, unspecified, uncomplicated: Secondary | ICD-10-CM

## 2021-10-14 DIAGNOSIS — B182 Chronic viral hepatitis C: Secondary | ICD-10-CM

## 2021-10-14 DIAGNOSIS — R140 Abdominal distension (gaseous): Secondary | ICD-10-CM

## 2021-10-14 HISTORY — DX: Erythema intertrigo: L30.4

## 2021-10-14 HISTORY — DX: Low back pain, unspecified: M54.50

## 2021-10-14 HISTORY — DX: Abdominal distension (gaseous): R14.0

## 2021-10-14 HISTORY — DX: Mastodynia: N64.4

## 2021-10-14 MED ORDER — FLUCONAZOLE 100 MG PO TABS: 100.0000 mg | ORAL_TABLET | Freq: Every day | ORAL | 0 refills | Status: DC

## 2021-10-14 NOTE — Telephone Encounter (Signed)
[facility] Patient Advocate Encounter ?  ?Received notification from [employer] that prior authorization for [place] is required. ?  ?PA submitted on 10/14/21 ?Key [id number] ?Status is pending ?   ?[facility] will continue to follow. ? ? ?Ileene Patrick, CPhT ?Specialty Pharmacy Patient Advocate ?[facility] ?Phone: [phone] ?Fax:  [phone] ?

## 2021-10-14 NOTE — Progress Notes (Signed)
? ?Subjective:  ? ?Chief complaints rash between his thighs sounds consistent with intertrigo also some pain in his breast tissue, with ongoing back pain that is worse especially in the morning ? ? Patient ID: JAYSTON FOLGAR, male    DOB: 09/05/1980, 41 y.o.   MRN: [id number] ? ?HPI ? 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 ? ?He returned in had tested positive for hepatitis C which he believes he acquired through sex as he is adamant he has not been using IV drugs recently and that he tested negative while in the treatment facility. ? ?He was seen in [place] by gastroenterology and labs have shown him to have  genotype 1a. Elastography showed no evidence of cirrhosis. ? ?Apparently since he has been back from [place] he has had LE edema and abdominal pain and distention as well as lower back pain ? ?He denies IVDU recently. He is rx adderal for ADHD. ? ?When I saw him I had some concerns that he still could be using drugs and that even if he was not that he could have had an infection that got in the bloodstream and seeded his spine.  Indeed his inflammatory markers were elevated when checked them in clinic.  I have endeavored to get an MRI of the spine but it been denied by his insurance. ? ?One of the requirements they had was that he had been treated for at least 6 weeks but has been on Suboxone for pain as well as gabapentin so I do not really understand what else they want me to be doing in terms of treating him. ? ?Fortunately this has not worsened but we will recheck inflammatory markers and still endeavor to get an MRI. ? ?His lower extremity edema that he was complaining of last time time is still present but not as bothersome the abdominal distention has resolved. ? ?He now has a rash in his groin he is  concerned about sounds like intertrigo. ? ?He also has had some tenderness of his breast tissue. ? ? ? ? ?Past Medical History:  ?Diagnosis Date  ? Anxiety   ? Back pain   ? Chronic hepatitis C without hepatic coma ([place]) 09/17/2021  ? Chronic knee pain   ? Depression   ? Gastric ulcer   ? Hepatitis-C   ? 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 methadone tx  ? IVDU (intravenous drug user) 09/17/2021  ? Kidney stones   ? Lower extremity edema 09/17/2021  ? RLS (restless legs syndrome)   ? Seizures ([place])   ? last one early 2018  ? Substance abuse ([place])   ? cocaine, benzos, marijuana, benzos, opiates  ? ? ?Past Surgical History:  ?Procedure Laterality Date  ? HERNIA REPAIR    ? WISDOM TOOTH EXTRACTION    ? ? ?Family History  ?Problem Relation Age of Onset  ? Cancer Mother   ?     breast cancer survivor  ? ? ?  ?Social History  ? ?Socioeconomic History  ? Marital status: Single  ?  Spouse name: Not on file  ? Number of children: Not on file  ? Years of education: Not on file  ? Highest education level: Not on file  ?Occupational History  ? Occupation: unemployed  ?Tobacco Use  ? Smoking  status: Every Day  ?  Packs/day: 0.50  ?  Years: 20.00  ?  Pack years: 10.00  ?  Types: Cigarettes  ? Smokeless tobacco: Never  ?Vaping Use  ? Vaping Use: Some days  ?Substance and Sexual Activity  ? Alcohol use: Not Currently  ? Drug use: Not Currently  ? Sexual activity: Not Currently  ?  Birth control/protection: None  ?Other Topics Concern  ? Not on file  ?Social History Narrative  ? Not on file  ? ?Social Determinants of Health  ? ?Financial Resource Strain: Not on file  ?Food Insecurity: Not on file  ?Transportation Needs: Not on file  ?Physical Activity: Not on file  ?Stress: Not on file  ?Social Connections: Not on file  ? ? ?Allergies  ?Allergen Reactions  ? Ibuprofen Other (See Comments)  ?  Cannot take due to stomach ulcers  ? Tylenol [Acetaminophen] Other (See Comments)  ?  Cannot take due to stomach ulcers  ? ? ? ?Current  Outpatient Medications:  ?  buprenorphine (SUBUTEX) 8 MG SUBL SL tablet, 8 mg 2 (two) times a day., Disp: , Rfl:  ?  clonazePAM (KLONOPIN) 1 MG tablet, Take 1 mg by mouth 3 (three) times daily as needed., Disp: , Rfl:  ?  gabapentin (NEURONTIN) 600 MG tablet, Take 600 mg by mouth 3 (three) times daily., Disp: , Rfl:  ?  methylphenidate 27 MG PO CR tablet, Take 27 mg by mouth every morning., Disp: , Rfl:  ?  pantoprazole (PROTONIX) 40 MG tablet, Take 1 tablet (40 mg total) by mouth daily., Disp: 30 tablet, Rfl: 0 ?  amitriptyline (ELAVIL) 150 MG tablet, Take 150 mg by mouth at bedtime. (Patient not taking: Reported on 09/17/2021), Disp: , Rfl:  ?  ARIPiprazole (ABILIFY) 5 MG tablet, Take 5 mg by mouth daily. (Patient not taking: Reported on 09/17/2021), Disp: , Rfl:  ?  furosemide (LASIX) 20 MG tablet, Take 1 tablet (20 mg total) by mouth daily. (Patient not taking: Reported on 09/17/2021), Disp: 7 tablet, Rfl: 0 ?  methocarbamol (ROBAXIN) 500 MG tablet, Take 1 tablet (500 mg total) by mouth 2 (two) times daily as needed for muscle spasms. May cause drowsiness, do not drive while taking this medication (Patient not taking: Reported on 09/17/2021), Disp: 20 tablet, Rfl: 0 ?  mirtazapine (REMERON) 7.5 MG tablet, Take 7.5 mg by mouth at bedtime. (Patient not taking: Reported on 09/17/2021), Disp: , Rfl:  ?  propranolol (INDERAL) 20 MG tablet, Take 10-20 mg by mouth 2 (two) times daily as needed. (Patient not taking: Reported on 10/14/2021), Disp: , Rfl:  ?  sucralfate (CARAFATE) 1 g tablet, Take 1 tablet (1 g total) by mouth 3 (three) times daily as needed. May dissolve 1 tablet into a glass of water and drink as needed to help coat your stomach (Patient not taking: Reported on 09/17/2021), Disp: 30 tablet, Rfl: 0 ? ? ?Review of Systems  ?Constitutional:  Negative for activity change, appetite change, chills, diaphoresis, fatigue, fever and unexpected weight change.  ?HENT:  Negative for congestion, rhinorrhea, sinus pressure,  sneezing, sore throat and trouble swallowing.   ?Eyes:  Negative for photophobia and visual disturbance.  ?Respiratory:  Negative for cough, chest tightness, shortness of breath, wheezing and stridor.   ?Cardiovascular:  Negative for chest pain, palpitations and leg swelling.  ?Gastrointestinal:  Negative for abdominal distention, abdominal pain, anal bleeding, blood in stool, constipation, diarrhea, nausea and vomiting.  ?Genitourinary:  Negative for difficulty urinating, dysuria, flank  pain and hematuria.  ?Musculoskeletal:  Positive for back pain. Negative for arthralgias, gait problem, joint swelling and myalgias.  ?Skin:  Positive for rash. Negative for color change, pallor and wound.  ?Neurological:  Negative for dizziness, tremors, weakness and light-headedness.  ?Hematological:  Negative for adenopathy. Does not bruise/bleed easily.  ?Psychiatric/Behavioral:  Negative for agitation, behavioral problems, confusion, decreased concentration, dysphoric mood and sleep disturbance.   ? ?   ?Objective:  ? Physical Exam ?Constitutional:   ?   Appearance: He is well-developed.  ?HENT:  ?   Head: Normocephalic and atraumatic.  ?Eyes:  ?   Conjunctiva/sclera: Conjunctivae normal.  ?Cardiovascular:  ?   Rate and Rhythm: Normal rate and regular rhythm.  ?Pulmonary:  ?   Effort: Pulmonary effort is normal. No respiratory distress.  ?   Breath sounds: No wheezing.  ?Chest:  ?Breasts: ?   Right: Tenderness present.  ?   Left: Tenderness present.  ?Abdominal:  ?   General: There is no distension.  ?   Palpations: Abdomen is soft.  ?Musculoskeletal:     ?   General: Normal range of motion.  ?   Cervical back: Normal range of motion and neck supple.  ?   Right lower leg: Edema present.  ?   Left lower leg: Edema present.  ?Skin: ?   General: Skin is warm and dry.  ?   Coloration: Skin is not pale.  ?   Findings: No erythema or rash.  ?Neurological:  ?   General: No focal deficit present.  ?   Mental Status: He is alert and  oriented to person, place, and time.  ?Psychiatric:     ?   Mood and Affect: Mood normal.     ?   Behavior: Behavior normal.     ?   Thought Content: Thought content normal.     ?   Judgment: Judgment normal.

## 2021-10-15 LAB — COMPLETE METABOLIC PANEL WITH GFR
AG Ratio: 1.4 (calc) (ref 1.0–2.5)
ALT: 15 U/L (ref 9–46)
AST: 15 U/L (ref 10–40)
Albumin: 4.1 g/dL (ref 3.6–5.1)
Alkaline phosphatase (APISO): 55 U/L (ref 36–130)
BUN: 14 mg/dL (ref 7–25)
CO2: 23 mmol/L (ref 20–32)
Calcium: 9.4 mg/dL (ref 8.6–10.3)
Chloride: 106 mmol/L (ref 98–110)
Creat: 0.72 mg/dL (ref 0.60–1.29)
Globulin: 2.9 g/dL (calc) (ref 1.9–3.7)
Glucose, Bld: 94 mg/dL (ref 65–99)
Potassium: 4.3 mmol/L (ref 3.5–5.3)
Sodium: 139 mmol/L (ref 135–146)
Total Bilirubin: 0.3 mg/dL (ref 0.2–1.2)
Total Protein: 7 g/dL (ref 6.1–8.1)
eGFR: 118 mL/min/{1.73_m2} (ref >=60)

## 2021-10-15 LAB — SEDIMENTATION RATE: Sed Rate: 9 mm/h (ref 0–15)

## 2021-10-15 LAB — C-REACTIVE PROTEIN: CRP: 2.4 mg/L (ref <8.0)

## 2021-10-19 DIAGNOSIS — F4312 Post-traumatic stress disorder, chronic: Secondary | ICD-10-CM | POA: Diagnosis not present

## 2021-10-19 DIAGNOSIS — Z79899 Other long term (current) drug therapy: Secondary | ICD-10-CM | POA: Diagnosis not present

## 2021-10-19 DIAGNOSIS — Z5181 Encounter for therapeutic drug level monitoring: Secondary | ICD-10-CM | POA: Diagnosis not present

## 2021-10-19 DIAGNOSIS — F319 Bipolar disorder, unspecified: Secondary | ICD-10-CM | POA: Diagnosis not present

## 2021-10-19 DIAGNOSIS — F411 Generalized anxiety disorder: Secondary | ICD-10-CM | POA: Diagnosis not present

## 2021-10-19 DIAGNOSIS — R69 Illness, unspecified: Secondary | ICD-10-CM | POA: Diagnosis not present

## 2021-10-19 DIAGNOSIS — F1124 Opioid dependence with opioid-induced mood disorder: Secondary | ICD-10-CM | POA: Diagnosis not present

## 2021-10-30 DIAGNOSIS — F1124 Opioid dependence with opioid-induced mood disorder: Secondary | ICD-10-CM | POA: Diagnosis not present

## 2021-10-30 DIAGNOSIS — R69 Illness, unspecified: Secondary | ICD-10-CM | POA: Diagnosis not present

## 2021-10-30 DIAGNOSIS — F411 Generalized anxiety disorder: Secondary | ICD-10-CM | POA: Diagnosis not present

## 2021-10-30 DIAGNOSIS — Z79899 Other long term (current) drug therapy: Secondary | ICD-10-CM | POA: Diagnosis not present

## 2021-10-30 DIAGNOSIS — Z5181 Encounter for therapeutic drug level monitoring: Secondary | ICD-10-CM | POA: Diagnosis not present

## 2021-10-30 DIAGNOSIS — F4312 Post-traumatic stress disorder, chronic: Secondary | ICD-10-CM | POA: Diagnosis not present

## 2021-10-30 DIAGNOSIS — F319 Bipolar disorder, unspecified: Secondary | ICD-10-CM | POA: Diagnosis not present

## 2021-11-03 ENCOUNTER — Other Ambulatory Visit (HOSPITAL_COMMUNITY): Payer: Self-pay

## 2021-11-09 ENCOUNTER — Telehealth: Payer: Self-pay

## 2021-11-09 ENCOUNTER — Other Ambulatory Visit (HOSPITAL_COMMUNITY): Payer: Self-pay

## 2021-11-09 NOTE — Telephone Encounter (Signed)
[facility] Patient Advocate Encounter   Received notification from [facility] that prior authorization for epclusa is required.   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    [facility] Clinic will continue to follow.   Clearance Coots, CPhT Specialty Pharmacy Patient [facility] for Infectious Disease Phone: [phone] Fax:  [phone]

## 2021-11-11 ENCOUNTER — Other Ambulatory Visit (HOSPITAL_COMMUNITY): Payer: Self-pay

## 2021-11-12 ENCOUNTER — Other Ambulatory Visit: Payer: Self-pay | Admitting: Pharmacist

## 2021-11-12 ENCOUNTER — Telehealth: Payer: Self-pay

## 2021-11-12 ENCOUNTER — Other Ambulatory Visit (HOSPITAL_COMMUNITY): Payer: Self-pay

## 2021-11-12 DIAGNOSIS — B182 Chronic viral hepatitis C: Secondary | ICD-10-CM

## 2021-11-12 MED ORDER — EPCLUSA 400-100 MG PO TABS: 1.0000 | ORAL_TABLET | Freq: Every day | ORAL | 2 refills | Status: DC

## 2021-11-12 NOTE — Telephone Encounter (Signed)
[facility] Patient Advocate Encounter  Prior Authorization for Andre Lynch [employer] Name) has been approved.    PA# [id number] Effective dates: 11/10/21 through 02/02/22  Prescription will need to be filled at [facility]. DAW 1  [facility] Clinic will continue to follow.  Clearance Coots, CPhT Specialty Pharmacy Patient [facility] for Infectious Disease Phone: [phone] Fax:  [phone]

## 2021-11-13 ENCOUNTER — Ambulatory Visit: Payer: 59 | Admitting: Pharmacist

## 2021-11-13 ENCOUNTER — Other Ambulatory Visit: Payer: Self-pay

## 2021-11-13 MED ORDER — EPCLUSA 400-100 MG PO TABS: 1.0000 | ORAL_TABLET | Freq: Every day | ORAL | 2 refills | Status: DC

## 2021-11-16 DIAGNOSIS — Z79899 Other long term (current) drug therapy: Secondary | ICD-10-CM | POA: Diagnosis not present

## 2021-11-16 DIAGNOSIS — F1124 Opioid dependence with opioid-induced mood disorder: Secondary | ICD-10-CM | POA: Diagnosis not present

## 2021-11-16 DIAGNOSIS — F4312 Post-traumatic stress disorder, chronic: Secondary | ICD-10-CM | POA: Diagnosis not present

## 2021-11-16 DIAGNOSIS — F411 Generalized anxiety disorder: Secondary | ICD-10-CM | POA: Diagnosis not present

## 2021-11-16 DIAGNOSIS — Z5181 Encounter for therapeutic drug level monitoring: Secondary | ICD-10-CM | POA: Diagnosis not present

## 2021-11-16 DIAGNOSIS — R69 Illness, unspecified: Secondary | ICD-10-CM | POA: Diagnosis not present

## 2021-11-16 DIAGNOSIS — F319 Bipolar disorder, unspecified: Secondary | ICD-10-CM | POA: Diagnosis not present

## 2021-11-30 DIAGNOSIS — R69 Illness, unspecified: Secondary | ICD-10-CM | POA: Diagnosis not present

## 2021-11-30 DIAGNOSIS — Z79899 Other long term (current) drug therapy: Secondary | ICD-10-CM | POA: Diagnosis not present

## 2021-12-14 DIAGNOSIS — R69 Illness, unspecified: Secondary | ICD-10-CM | POA: Diagnosis not present

## 2021-12-14 DIAGNOSIS — Z79899 Other long term (current) drug therapy: Secondary | ICD-10-CM | POA: Diagnosis not present

## 2021-12-24 ENCOUNTER — Other Ambulatory Visit: Payer: Self-pay

## 2021-12-24 ENCOUNTER — Ambulatory Visit (INDEPENDENT_AMBULATORY_CARE_PROVIDER_SITE_OTHER): Payer: 59 | Admitting: Pharmacist

## 2021-12-24 DIAGNOSIS — B182 Chronic viral hepatitis C: Secondary | ICD-10-CM

## 2021-12-24 DIAGNOSIS — R69 Illness, unspecified: Secondary | ICD-10-CM | POA: Diagnosis not present

## 2021-12-24 DIAGNOSIS — Z23 Encounter for immunization: Secondary | ICD-10-CM

## 2021-12-24 NOTE — Progress Notes (Signed)
HPI: Andre Lynch is a 41 y.o. male who presents to the [facility] pharmacy clinic for Hepatitis C follow-up.  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   ARIPIPRAZOLE (ABILIFY) 5 MG TABLET    Take 5 mg by mouth daily.   BUPRENORPHINE (SUBUTEX) 8 MG SUBL SL TABLET    8 mg 2 (two) times a day.   CLONAZEPAM (KLONOPIN) 1 MG TABLET    Take 1 mg by mouth 3 (three) times daily as needed.   EPCLUSA 400-100 MG TABS    Take 1 tablet by mouth daily.   FLUCONAZOLE (DIFLUCAN) 100 MG TABLET    Take 1 tablet (100 mg total) by mouth daily.   FUROSEMIDE (LASIX) 20 MG TABLET    Take 1 tablet (20 mg total) by mouth daily.   GABAPENTIN (NEURONTIN) 600 MG TABLET    Take 600 mg by mouth 3 (three) times daily.   METHOCARBAMOL (ROBAXIN) 500 MG TABLET    Take 1 tablet (500 mg total) by mouth 2 (two) times daily as needed for muscle spasms. May cause drowsiness, do not  drive while taking this medication   METHYLPHENIDATE 27 MG PO CR TABLET    Take 27 mg by mouth every morning.   MIRTAZAPINE (REMERON) 7.5 MG TABLET    Take 7.5 mg by mouth at bedtime.   PANTOPRAZOLE (PROTONIX) 40 MG TABLET    Take 1 tablet (40 mg total) by mouth daily.   PROPRANOLOL (INDERAL) 20 MG TABLET    Take 10-20 mg by mouth 2 (two) times daily as needed.   SUCRALFATE (CARAFATE) 1 G TABLET    Take 1 tablet (1 g total) by mouth 3 (three) times daily as needed. May dissolve 1 tablet into a glass of water and drink as needed to help coat your stomach  Modified Medications   No medications on file  Discontinued Medications   No medications on file    Allergies: Allergies  Allergen Reactions   Ibuprofen Other (See Comments)    Cannot take due to stomach ulcers   Tylenol [Acetaminophen] Other (See Comments)    Cannot take due to stomach ulcers    Past Medical History: Past Medical History:  Diagnosis Date   Abdominal distension 10/14/2021   Anxiety    Back pain    Breast tenderness 10/14/2021   Chronic hepatitis C without hepatic coma (HCC) 09/17/2021   Chronic knee pain    Depression  Gastric ulcer    Hepatitis-C    History of narcotic addiction (HCC)    methadone tx   Intertrigo 10/14/2021   IVDU (intravenous drug user) 09/17/2021   Kidney stones    Lower extremity edema 09/17/2021   Lumbago 10/14/2021   RLS (restless legs syndrome)    Seizures (HCC)    last one early 2018   Substance abuse (HCC)    cocaine, benzos, marijuana, benzos, opiates    Social History: Social History   Socioeconomic History   Marital status: Single    Spouse name: Not on file   Number of children: Not on file   Years of education: Not on file   Highest education level: Not on file  Occupational History   Occupation: unemployed  Tobacco Use   Smoking status: Every Day    Packs/day: 0.50    Years: 20.00    Total pack years: 10.00    Types: Cigarettes   Smokeless tobacco: Never   Vaping Use   Vaping Use: Some days  Substance and Sexual Activity   Alcohol use: Not Currently   Drug use: Not Currently   Sexual activity: Not Currently    Birth control/protection: None  Other Topics Concern   Not on file  Social History Narrative   Not on file   Social Determinants of Health   Financial Resource Strain: Not on file  Food Insecurity: Not on file  Transportation Needs: Not on file  Physical Activity: Not on file  Stress: Not on file  Social Connections: Not on file    Labs: Hepatitis C Lab Results  Component Value Date   HCVGENOTYPE 1a 07/17/2021   HCVRNAPCRQN 1,710,000 (H) 07/17/2021   FIBROSTAGE F0 09/17/2021   Hepatitis B Lab Results  Component Value Date   HEPBSAG NON REACTIVE 06/18/2021   HEPBCAB NON-REACTIVE 07/17/2021   Hepatitis A Lab Results  Component Value Date   HAV NON-REACTIVE 09/17/2021   HIV Lab Results  Component Value Date   HIV NON-REACTIVE 07/17/2021   HIV Non Reactive 06/25/2020   Lab Results  Component Value Date   CREATININE 0.72 10/14/2021   CREATININE 0.77 09/17/2021   CREATININE 0.80 07/17/2021   CREATININE 0.75 06/23/2021   CREATININE 0.51 (L) 06/27/2020   Lab Results  Component Value Date   AST 15 10/14/2021   AST 20 09/17/2021   AST 43 (H) 07/17/2021   ALT 15 10/14/2021   ALT 28 09/17/2021   ALT 27 09/17/2021   INR 0.9 07/17/2021    Assessment: Andre Lynch presents to clinic today for HCV follow-up. He has been taking Epclusa for 4 weeks and is tolerating the medicine well. He has not missed any doses as his mother will set out his morning medicines everyday before breakfast. He already received his second [facility] of [facility] Reminded him he will need to refill it one more time after that new box. He also confirms he is not taking pantoprazole or another other PPI. Will check HCV RNA today and follow-up in 8 weeks with Dr. Daiva Eves. Patient does not demonstrate immunity to hepatitis A or B;  will start both vaccine series today. Will receive second hepatitis B vaccine at next follow-up with Dr. Daiva Eves and then second hepatitis A vaccine at his cure visit in 5 months.   Patient also requesting follow up on his MRI approval through his insurance. Requesting if we can work on further authorization as appropriate.    Plan: Check HCV RNA Continue  Epclusa x 8 weeks Administer hepatitis B vaccine and hepatitis A vaccine  Follow-up with Dr. Daiva Eves on 10/3 at 9:30am  Margarite Gouge, PharmD, CPP, BCIDP Clinical Pharmacist Practitioner Infectious Diseases Clinical Pharmacist [facility] 12/24/2021, 9:49 AM   Ask about PA for MRI?

## 2021-12-25 ENCOUNTER — Encounter: Payer: Self-pay | Admitting: Infectious Disease

## 2021-12-25 DIAGNOSIS — Z79899 Other long term (current) drug therapy: Secondary | ICD-10-CM | POA: Diagnosis not present

## 2021-12-25 DIAGNOSIS — R69 Illness, unspecified: Secondary | ICD-10-CM | POA: Diagnosis not present

## 2021-12-26 LAB — HEPATITIS C RNA QUANTITATIVE
HCV Quantitative Log: <1.18 log IU/mL
HCV RNA, PCR, QN: <15 IU/mL

## 2022-01-11 DIAGNOSIS — Z79899 Other long term (current) drug therapy: Secondary | ICD-10-CM | POA: Diagnosis not present

## 2022-01-11 DIAGNOSIS — F1124 Opioid dependence with opioid-induced mood disorder: Secondary | ICD-10-CM | POA: Diagnosis not present

## 2022-01-26 DIAGNOSIS — R69 Illness, unspecified: Secondary | ICD-10-CM | POA: Diagnosis not present

## 2022-01-26 DIAGNOSIS — Z79899 Other long term (current) drug therapy: Secondary | ICD-10-CM | POA: Diagnosis not present

## 2022-01-28 ENCOUNTER — Other Ambulatory Visit: Payer: Self-pay

## 2022-01-28 ENCOUNTER — Ambulatory Visit (INDEPENDENT_AMBULATORY_CARE_PROVIDER_SITE_OTHER): Payer: 59 | Admitting: Orthopaedic Surgery

## 2022-01-28 ENCOUNTER — Encounter: Payer: Self-pay | Admitting: Orthopaedic Surgery

## 2022-01-28 VITALS — BP 115/85 | HR 96 | Ht 69.0 in | Wt 184.0 lb

## 2022-01-28 DIAGNOSIS — M5442 Lumbago with sciatica, left side: Secondary | ICD-10-CM | POA: Diagnosis not present

## 2022-01-28 DIAGNOSIS — G8929 Other chronic pain: Secondary | ICD-10-CM

## 2022-01-28 DIAGNOSIS — M5441 Lumbago with sciatica, right side: Secondary | ICD-10-CM | POA: Diagnosis not present

## 2022-01-28 MED ORDER — METHOCARBAMOL 500 MG PO TABS: 500.0000 mg | ORAL_TABLET | Freq: Three times a day (TID) | ORAL | 1 refills | Status: DC

## 2022-01-28 NOTE — Patient Instructions (Addendum)
[facility] [phone]

## 2022-01-28 NOTE — Progress Notes (Signed)
Subjective:    Patient ID: Andre Lynch, male    DOB: Oct 14, 1980, 41 y.o.   MRN: [id number]  HPI He has long history of lower back pain.  He had a fall from a height in 2020 and hurt his lower back.  He was in hospital for a few days.   Since then he has had lower back pain with no radiation.  He has applied for disability and it is pending.  He is no longer working.  He saw a physician in January and had X-rays of the lumbar spine.  It showed:IMPRESSION: 1. Stable left convex scoliosis with mild diffuse spondylosis and facet hypertrophy as above. No significant change since prior exam.  I have independently reviewed and interpreted x-rays of this patient done at another site by another physician or qualified health professional.  He tried to get a MRI earlier in the year but it was denied.  He has pain in the lower back that is constant.  He has no new trauma.  He has no bowel or bladder problems.  He has pain more first thing in the morning and after standing or walking a while.  Sitting down helps.  He has no paresthesias.  He is not taking any NSAIDs as he has had ulcers.  He is currently seeing a psychiatrist.    He has had problems with narcotics in the past and is upfront about it.  He does not want any type of narcotic.  Review of Systems  Constitutional:  Positive for activity change.  Musculoskeletal:  Positive for arthralgias and back pain.  All other systems reviewed and are negative. For Review of Systems, all other systems reviewed and are negative.  The following is a summary of the past history medically, past history surgically, known current medicines, social history and family history.  This information is gathered electronically by the computer from prior information and documentation.  I review this each visit and have found including this information at this point in the chart is beneficial and informative.   Past Medical History:  Diagnosis Date   Abdominal  distension 10/14/2021   Anxiety    Back pain    Breast tenderness 10/14/2021   Chronic hepatitis C without hepatic coma (HCC) 09/17/2021   Chronic knee pain    Depression    Gastric ulcer    Hepatitis-C    History of narcotic addiction (HCC)    methadone tx   Intertrigo 10/14/2021   IVDU (intravenous drug user) 09/17/2021   Kidney stones    Lower extremity edema 09/17/2021   Lumbago 10/14/2021   RLS (restless legs syndrome)    Seizures (HCC)    last one early 2018   Substance abuse (HCC)    cocaine, benzos, marijuana, benzos, opiates    Past Surgical History:  Procedure Laterality Date   HERNIA REPAIR     WISDOM TOOTH EXTRACTION      Current Outpatient Medications on File Prior to Visit  Medication Sig Dispense Refill   buprenorphine (SUBUTEX) 8 MG SUBL SL tablet 8 mg 2 (two) times a day.     CAPLYTA 42 MG capsule SMARTSIG:1 Capsule(s) By Mouth Every Evening     clonazePAM (KLONOPIN) 1 MG tablet Take 1 mg by mouth 3 (three) times daily as needed.     EPCLUSA 400-100 MG TABS Take 1 tablet by mouth daily. 28 tablet 2   gabapentin (NEURONTIN) 600 MG tablet Take 600 mg by mouth 3 (three) times daily.  methylphenidate 27 MG PO CR tablet Take 27 mg by mouth every morning.     propranolol (INDERAL) 20 MG tablet Take 10-20 mg by mouth 2 (two) times daily as needed. (Patient not taking: Reported on 10/14/2021)     No current facility-administered medications on file prior to visit.    Social History   Socioeconomic History   Marital status: Single    Spouse name: Not on file   Number of children: Not on file   Years of education: Not on file   Highest education level: Not on file  Occupational History   Occupation: unemployed  Tobacco Use   Smoking status: Every Day    Packs/day: 0.50    Years: 20.00    Total pack years: 10.00    Types: Cigarettes   Smokeless tobacco: Never  Vaping Use   Vaping Use: Some days  Substance and Sexual Activity   Alcohol use: Not Currently    Drug use: Not Currently   Sexual activity: Not Currently    Birth control/protection: None  Other Topics Concern   Not on file  Social History Narrative   Not on file   Social Determinants of Health   Financial Resource Strain: Not on file  Food Insecurity: Not on file  Transportation Needs: Not on file  Physical Activity: Not on file  Stress: Not on file  Social Connections: Not on file  Intimate Partner Violence: Not on file    Family History  Problem Relation Age of Onset   Cancer Mother        breast cancer survivor    BP 115/85   Pulse 96   Ht 5\' 9"  (1.753 m)   Wt 184 lb (83.5 kg)   BMI 27.17 kg/m   Body mass index is 27.17 kg/m.      Objective:   Physical Exam Vitals and nursing note reviewed. Exam conducted with a chaperone present.  Constitutional:      Appearance: He is well-developed.  HENT:     Head: Normocephalic and atraumatic.  Eyes:     Conjunctiva/sclera: Conjunctivae normal.     Pupils: Pupils are equal, round, and reactive to light.  Cardiovascular:     Rate and Rhythm: Normal rate and regular rhythm.  Pulmonary:     Effort: Pulmonary effort is normal.  Abdominal:     Palpations: Abdomen is soft.  Musculoskeletal:       Arms:     Cervical back: Normal range of motion and neck supple.  Skin:    General: Skin is warm and dry.  Neurological:     Mental Status: He is alert and oriented to person, place, and time.     Cranial Nerves: No cranial nerve deficit.     Motor: No abnormal muscle tone.     Coordination: Coordination normal.     Deep Tendon Reflexes: Reflexes are normal and symmetric. Reflexes normal.  Psychiatric:        Behavior: Behavior normal.        Thought Content: Thought content normal.        Judgment: Judgment normal.           Assessment & Plan:   Encounter Diagnosis  Name Primary?   Chronic bilateral low back pain with bilateral sciatica Yes   I will set up PT for his back.  He will need a MRI.   But we need to do the things required of the insurance company before obtaining.  I will give  Robaxin.  Return in three weeks.  Call if any problem.  Precautions discussed.  Electronically Signed Darreld Mclean, MD 8/31/202310:18 AM

## 2022-02-09 ENCOUNTER — Encounter: Payer: Self-pay | Admitting: Internal Medicine

## 2022-02-09 ENCOUNTER — Ambulatory Visit (INDEPENDENT_AMBULATORY_CARE_PROVIDER_SITE_OTHER): Payer: 59 | Admitting: Internal Medicine

## 2022-02-09 VITALS — BP 120/82 | HR 88 | Ht 69.0 in | Wt 184.8 lb

## 2022-02-09 DIAGNOSIS — Z720 Tobacco use: Secondary | ICD-10-CM

## 2022-02-09 DIAGNOSIS — Z2821 Immunization not carried out because of patient refusal: Secondary | ICD-10-CM | POA: Diagnosis not present

## 2022-02-09 DIAGNOSIS — M5441 Lumbago with sciatica, right side: Secondary | ICD-10-CM | POA: Diagnosis not present

## 2022-02-09 DIAGNOSIS — R5383 Other fatigue: Secondary | ICD-10-CM

## 2022-02-09 DIAGNOSIS — M5442 Lumbago with sciatica, left side: Secondary | ICD-10-CM

## 2022-02-09 DIAGNOSIS — R69 Illness, unspecified: Secondary | ICD-10-CM | POA: Diagnosis not present

## 2022-02-09 DIAGNOSIS — B182 Chronic viral hepatitis C: Secondary | ICD-10-CM

## 2022-02-09 DIAGNOSIS — G8929 Other chronic pain: Secondary | ICD-10-CM

## 2022-02-09 DIAGNOSIS — F909 Attention-deficit hyperactivity disorder, unspecified type: Secondary | ICD-10-CM

## 2022-02-09 DIAGNOSIS — F1911 Other psychoactive substance abuse, in remission: Secondary | ICD-10-CM

## 2022-02-09 DIAGNOSIS — Z79899 Other long term (current) drug therapy: Secondary | ICD-10-CM | POA: Diagnosis not present

## 2022-02-09 DIAGNOSIS — F411 Generalized anxiety disorder: Secondary | ICD-10-CM

## 2022-02-09 DIAGNOSIS — Z0000 Encounter for general adult medical examination without abnormal findings: Secondary | ICD-10-CM

## 2022-02-09 NOTE — Patient Instructions (Signed)
It was a pleasure to see you today.  Thank you for giving [place] the opportunity to be involved in your care.  Below is a brief recap of your visit and next steps.  We will plan to see you again in 3 months  Summary We are checking basic labs today, including a testosterone level  Next steps Follow up in 3 months I will notify you of lab results this week

## 2022-02-09 NOTE — Progress Notes (Unsigned)
New Patient Office Visit  Subjective    Patient ID: Andre Lynch, male    DOB: [birth year]  Age: 41 y.o. MRN: [id number]  CC:  Chief Complaint  Patient presents with   Establish Care   HPI Andre Lynch presents to establish care.  He is a 41 year old male with a past medical history significant for tobacco use, previous substance use, ADHD, anxiety, and hepatitis C.  He presents today to establish care.  He was previously followed by Dr. Neta Mends at [facility] family medicine in [place], [place].  Last seen in January 2023.  Andre Lynch has since established care with [facility] in [place], [place].  Andre Lynch has been to multiple rehabilitations previously, most recently in August 16, 2020 following the death of his father.  His acute concern today is recent fatigue, noting a lack of energy, periodic erectile dysfunction, and a loss of sex drive.  He would like to have his testosterone level checked as he knows multiple people receiving testosterone injections due to hypogonadism.  Andre Lynch has also recently been seen by Dr. Luna Glasgow with [facility] in [place] for low back pain.  He states that his chronic low back pain originates from falling off of a ladder several years ago.  He states that he soon undergo MRI.  Acute concerns, chronic medical conditions, and outstanding preventative healthcare maintenance items discussed today are individually addressed in A/P below.  Outpatient Encounter Medications as of 02/09/2022  Medication Sig   CAPLYTA 42 MG capsule SMARTSIG:1 Capsule(s) By Mouth Every Evening   clonazePAM (KLONOPIN) 1 MG tablet Take 1 mg by mouth 3 (three) times daily as needed.   EPCLUSA 400-100 MG TABS Take 1 tablet by mouth daily.   gabapentin (NEURONTIN) 600 MG tablet Take 600 mg by mouth 3 (three) times daily.   methocarbamol (ROBAXIN) 500 MG tablet Take 1 tablet (500 mg total) by mouth 3 (three) times daily.   methylphenidate 54 MG PO CR tablet Take 54  mg by mouth every morning.   propranolol (INDERAL) 20 MG tablet Take 10-20 mg by mouth 2 (two) times daily as needed.   [DISCONTINUED] buprenorphine (SUBUTEX) 8 MG SUBL SL tablet 8 mg 2 (two) times a day.   [DISCONTINUED] methylphenidate 27 MG PO CR tablet Take 27 mg by mouth every morning.   No facility-administered encounter medications on file as of 02/09/2022.    Past Medical History:  Diagnosis Date   Abdominal distension 10/14/2021   Anxiety    Back pain    Breast tenderness 10/14/2021   Chronic hepatitis C without hepatic coma (Andre Lynch) 09/17/2021   Chronic knee pain    Depression    Gastric ulcer    Hepatitis-C    History of narcotic addiction (Andre Lynch)    methadone tx   Intertrigo 10/14/2021   IVDU (intravenous drug user) 09/17/2021   Kidney stones    Lower extremity edema 09/17/2021   Lumbago 10/14/2021   RLS (restless legs syndrome)    Seizures (Andre Lynch)    last one early 2016-08-16   Substance abuse (HCC)    cocaine, benzos, marijuana, benzos, opiates    Past Surgical History:  Procedure Laterality Date   HERNIA REPAIR     WISDOM TOOTH EXTRACTION      Family History  Problem Relation Age of Onset   Cancer Mother        breast cancer survivor    Social History   Socioeconomic History   Marital status: Single  Spouse name: Not on file   Number of children: Not on file   Years of education: Not on file   Highest education level: Not on file  Occupational History   Occupation: unemployed  Tobacco Use   Smoking status: Every Day    Packs/day: 0.50    Years: 20.00    Total pack years: 10.00    Types: Cigarettes   Smokeless tobacco: Never  Vaping Use   Vaping Use: Some days  Substance and Sexual Activity   Alcohol use: Not Currently   Drug use: Not Currently   Sexual activity: Not Currently    Birth control/protection: None  Other Topics Concern   Not on file  Social History Narrative   Not on file   Social Determinants of Health   Financial Resource Strain:  Not on file  Food Insecurity: Not on file  Transportation Needs: Not on file  Physical Activity: Not on file  Stress: Not on file  Social Connections: Not on file  Intimate Partner Violence: Not on file   Review of Systems  Constitutional:  Negative for chills and fever.  HENT:  Negative for sore throat.   Respiratory:  Negative for cough and shortness of breath.   Cardiovascular:  Negative for chest pain, palpitations and leg swelling.  Gastrointestinal:  Negative for abdominal pain, blood in stool, constipation, diarrhea, nausea and vomiting.  Genitourinary:  Negative for dysuria and hematuria.  Musculoskeletal:  Positive for back pain. Negative for myalgias.       Chronic low back pain  Skin:  Negative for itching and rash.  Neurological:  Negative for dizziness and headaches.  Psychiatric/Behavioral:  Negative for depression and suicidal ideas. The patient is nervous/anxious.    Objective    BP 120/82   Pulse 88   Ht 5' 9"  (1.753 m)   Wt 184 lb 12.8 oz (83.8 kg)   SpO2 93%   BMI 27.29 kg/m   Physical Exam Vitals reviewed.  Constitutional:      General: He is not in acute distress.    Appearance: Normal appearance. He is not ill-appearing.  HENT:     Head: Normocephalic and atraumatic.     Nose: Nose normal. No congestion or rhinorrhea.     Mouth/Throat:     Mouth: Mucous membranes are moist.     Pharynx: Oropharynx is clear.  Eyes:     Extraocular Movements: Extraocular movements intact.     Conjunctiva/sclera: Conjunctivae normal.     Pupils: Pupils are equal, round, and reactive to light.  Cardiovascular:     Rate and Rhythm: Normal rate and regular rhythm.     Pulses: Normal pulses.     Heart sounds: Normal heart sounds. No murmur heard. Pulmonary:     Effort: Pulmonary effort is normal.     Breath sounds: Normal breath sounds. No wheezing, rhonchi or rales.  Abdominal:     General: Abdomen is flat. Bowel sounds are normal. There is no distension.      Palpations: Abdomen is soft.     Tenderness: There is no abdominal tenderness.  Musculoskeletal:        General: Tenderness present. No swelling or deformity. Normal range of motion.     Cervical back: Normal range of motion.     Comments: Tenderness palpation over the lumbar spine  Skin:    General: Skin is warm and dry.     Capillary Refill: Capillary refill takes less than 2 seconds.  Neurological:  General: No focal deficit present.     Mental Status: He is alert and oriented to person, place, and time.     Motor: No weakness.  Psychiatric:        Mood and Affect: Mood normal.        Behavior: Behavior normal.        Thought Content: Thought content normal.    Last CBC Lab Results  Component Value Date   WBC 6.0 02/09/2022   HGB 14.3 02/09/2022   HCT 41.9 02/09/2022   MCV 97 02/09/2022   MCH 32.9 02/09/2022   RDW 12.4 02/09/2022   PLT 260 09/32/3557   Last metabolic panel Lab Results  Component Value Date   GLUCOSE 151 (H) 02/09/2022   NA 140 02/09/2022   K 4.1 02/09/2022   CL 103 02/09/2022   CO2 21 02/09/2022   BUN 12 02/09/2022   CREATININE 0.80 02/09/2022   EGFR 113 02/09/2022   CALCIUM 9.6 02/09/2022   PHOS 3.2 06/25/2020   PROT 6.8 02/09/2022   ALBUMIN 4.4 02/09/2022   LABGLOB 2.4 02/09/2022   AGRATIO 1.8 02/09/2022   BILITOT 0.4 02/09/2022   ALKPHOS 62 02/09/2022   AST 17 02/09/2022   ALT 13 02/09/2022   ANIONGAP 9 06/23/2021   Last lipids Lab Results  Component Value Date   CHOL 175 02/09/2022   HDL 52 02/09/2022   LDLCALC 103 (H) 02/09/2022   TRIG 114 02/09/2022   CHOLHDL 3.4 02/09/2022   Last hemoglobin A1c Lab Results  Component Value Date   HGBA1C 5.7 (H) 02/09/2022   Last thyroid functions Lab Results  Component Value Date   TSH 1.690 06/24/2020     Assessment & Plan:   Problem List Items Addressed This Visit       Digestive   Chronic hepatitis C without hepatic coma ([place])    Diagnosed early 2023.  Followed by ID.   Being treated with Epclusa, states that he should be finishing up his treatment course soon.  HCV viral load undetectable when checked in July. -ID follow-up scheduled for early October        Nervous and Auditory   Chronic bilateral low back pain with sciatica    He has established care with Dr. Luna Glasgow at [facility].  He has been referred for PT and MRI pending.  Reiterates today that he is not interested in any type of narcotics.        Other   GAD (generalized anxiety disorder) (Chronic)    He has establish care with [facility] in [place], [place].  He was prescribed Klonopin, gabapentin, and propranolol for anxiety relief.  These prescriptions are managed by a psychiatrist at that facility.  States that his anxiety is well controlled currently      H/O: substance abuse (Andre Lynch)    Currently prescribed Suboxone by psychiatrist.  States that he ultimately wants to get off of Suboxone, but was told by psychiatrist that now was not appropriate time to do so given his current back issues, pending MRI, and potential intervention.      ADHD    Prescribed Concerta for treatment of ADHD.  This prescription is managed by his psychiatrist.      Tobacco use    Current, everyday smoker.  Smokes 0.5 packs/day and has been smoking at age 73.  He states that he is mostly smoked 1 pack/day during this time. -Wants to quit, declines NRT.  -The patient was counseled on the  dangers of tobacco use, and was advised to quit.  Reviewed strategies to maximize success, including removing cigarettes and smoking materials from environment, stress management, substitution of other forms of reinforcement, support of family/friends and written materials.      Fatigue    Today he endorses recent fatigue, noting that he has a lack of energy, periodically experiences erectile dysfunction, and has no sex drive.  He is interested in having his testosterone level checked as he knows several  people that receive testosterone injections due to hypogonadism.  Aside from fatigue he largely denies associated symptoms such as dyspnea on exertion, orthopnea, fever/chills, unintentional weight loss, hematuria, and melena/hematochezia -Free/total testosterone levels to be checked at 8 AM      Preventative health care    Presenting today to establish care. -Basic labs ordered -He declined outstanding COVID/flu vaccines -States that he had a Tdap vaccine in 2020 while in [place].  We will work to obtain records.      Return in about 3 months (around 05/11/2022).   Johnette Abraham, MD

## 2022-02-10 DIAGNOSIS — Z0000 Encounter for general adult medical examination without abnormal findings: Secondary | ICD-10-CM | POA: Insufficient documentation

## 2022-02-10 DIAGNOSIS — F909 Attention-deficit hyperactivity disorder, unspecified type: Secondary | ICD-10-CM | POA: Insufficient documentation

## 2022-02-10 DIAGNOSIS — Z720 Tobacco use: Secondary | ICD-10-CM | POA: Insufficient documentation

## 2022-02-10 DIAGNOSIS — R5383 Other fatigue: Secondary | ICD-10-CM | POA: Insufficient documentation

## 2022-02-10 NOTE — Assessment & Plan Note (Signed)
Today he endorses recent fatigue, noting that he has a lack of energy, periodically experiences erectile dysfunction, and has no sex drive.  He is interested in having his testosterone level checked as he knows several people that receive testosterone injections due to hypogonadism.  Aside from fatigue he largely denies associated symptoms such as dyspnea on exertion, orthopnea, fever/chills, unintentional weight loss, hematuria, and melena/hematochezia -Free/total testosterone levels to be checked at 8 AM

## 2022-02-10 NOTE — Assessment & Plan Note (Signed)
Presenting today to establish care. -Basic labs ordered -He declined outstanding COVID/flu vaccines -States that he had a Tdap vaccine in 2020 while in [place].  We will work to obtain records.

## 2022-02-10 NOTE — Assessment & Plan Note (Signed)
He has established care with Dr. Hilda Lias at [facility].  He has been referred for PT and MRI pending.  Reiterates today that he is not interested in any type of narcotics.

## 2022-02-10 NOTE — Assessment & Plan Note (Signed)
Diagnosed early 2023.  Followed by ID.  Being treated with Epclusa, states that he should be finishing up his treatment course soon.  HCV viral load undetectable when checked in July. -ID follow-up scheduled for early October

## 2022-02-10 NOTE — Assessment & Plan Note (Signed)
He has establish care with [facility] in [place], [place].  He was prescribed Klonopin, gabapentin, and propranolol for anxiety relief.  These prescriptions are managed by a psychiatrist at that facility.  States that his anxiety is well controlled currently

## 2022-02-10 NOTE — Assessment & Plan Note (Signed)
Prescribed Concerta for treatment of ADHD.  This prescription is managed by his psychiatrist.

## 2022-02-10 NOTE — Assessment & Plan Note (Signed)
Currently prescribed Suboxone by psychiatrist.  States that he ultimately wants to get off of Suboxone, but was told by psychiatrist that now was not appropriate time to do so given his current back issues, pending MRI, and potential intervention.

## 2022-02-10 NOTE — Assessment & Plan Note (Signed)
Current, everyday smoker.  Smokes 0.5 packs/day and has been smoking at age 41.  He states that he is mostly smoked 1 pack/day during this time. -Wants to quit, declines NRT.  -The patient was counseled on the dangers of tobacco use, and was advised to quit.  Reviewed strategies to maximize success, including removing cigarettes and smoking materials from environment, stress management, substitution of other forms of reinforcement, support of family/friends and written materials.

## 2022-02-11 ENCOUNTER — Other Ambulatory Visit: Payer: Self-pay

## 2022-02-11 DIAGNOSIS — R5383 Other fatigue: Secondary | ICD-10-CM | POA: Diagnosis not present

## 2022-02-15 ENCOUNTER — Encounter: Payer: Self-pay | Admitting: Internal Medicine

## 2022-02-15 LAB — TESTOSTERONE,FREE AND TOTAL
Testosterone, Free: 2.2 pg/mL — ABNORMAL LOW (ref 6.8–21.5)
Testosterone, Free: 3 pg/mL — ABNORMAL LOW (ref 6.8–21.5)
Testosterone: 272 ng/dL (ref 264–916)
Testosterone: 267 ng/dL (ref 264–916)

## 2022-02-15 LAB — LIPID PANEL
Chol/HDL Ratio: 3.4 ratio (ref 0.0–5.0)
Cholesterol, Total: 175 mg/dL (ref 100–199)
HDL: 52 mg/dL (ref >39)
LDL Chol Calc (NIH): 103 mg/dL — ABNORMAL HIGH (ref 0–99)
Triglycerides: 114 mg/dL (ref 0–149)
VLDL Cholesterol Cal: 20 mg/dL (ref 5–40)

## 2022-02-15 LAB — CBC WITH DIFFERENTIAL/PLATELET
Basophils Absolute: 0 10*3/uL (ref 0.0–0.2)
Basos: 1 %
EOS (ABSOLUTE): 0.2 10*3/uL (ref 0.0–0.4)
Eos: 3 %
Hematocrit: 41.9 % (ref 37.5–51.0)
Hemoglobin: 14.3 g/dL (ref 13.0–17.7)
Immature Grans (Abs): 0 10*3/uL (ref 0.0–0.1)
Immature Granulocytes: 0 %
Lymphocytes Absolute: 2.5 10*3/uL (ref 0.7–3.1)
Lymphs: 42 %
MCH: 32.9 pg (ref 26.6–33.0)
MCHC: 34.1 g/dL (ref 31.5–35.7)
MCV: 97 fL (ref 79–97)
Monocytes Absolute: 0.5 10*3/uL (ref 0.1–0.9)
Monocytes: 8 %
Neutrophils Absolute: 2.8 10*3/uL (ref 1.4–7.0)
Neutrophils: 46 %
Platelets: 260 10*3/uL (ref 150–450)
RBC: 4.34 x10E6/uL (ref 4.14–5.80)
RDW: 12.4 % (ref 11.6–15.4)
WBC: 6 10*3/uL (ref 3.4–10.8)

## 2022-02-15 LAB — CMP14+EGFR
ALT: 13 IU/L (ref 0–44)
AST: 17 IU/L (ref 0–40)
Albumin/Globulin Ratio: 1.8 (ref 1.2–2.2)
Albumin: 4.4 g/dL (ref 4.1–5.1)
Alkaline Phosphatase: 62 IU/L (ref 44–121)
BUN/Creatinine Ratio: 15 (ref 9–20)
BUN: 12 mg/dL (ref 6–24)
Bilirubin Total: 0.4 mg/dL (ref 0.0–1.2)
CO2: 21 mmol/L (ref 20–29)
Calcium: 9.6 mg/dL (ref 8.7–10.2)
Chloride: 103 mmol/L (ref 96–106)
Creatinine, Ser: 0.8 mg/dL (ref 0.76–1.27)
Globulin, Total: 2.4 g/dL (ref 1.5–4.5)
Glucose: 151 mg/dL — ABNORMAL HIGH (ref 70–99)
Potassium: 4.1 mmol/L (ref 3.5–5.2)
Sodium: 140 mmol/L (ref 134–144)
Total Protein: 6.8 g/dL (ref 6.0–8.5)
eGFR: 113 mL/min/{1.73_m2} (ref >59)

## 2022-02-15 LAB — HEMOGLOBIN A1C
Est. average glucose Bld gHb Est-mCnc: 117 mg/dL
Hgb A1c MFr Bld: 5.7 % — ABNORMAL HIGH (ref 4.8–5.6)

## 2022-02-16 DIAGNOSIS — E291 Testicular hypofunction: Secondary | ICD-10-CM

## 2022-02-18 ENCOUNTER — Ambulatory Visit: Payer: 59 | Admitting: Orthopaedic Surgery

## 2022-02-18 ENCOUNTER — Encounter: Payer: Self-pay | Admitting: Orthopaedic Surgery

## 2022-02-18 VITALS — BP 124/87 | HR 95 | Ht 69.0 in | Wt 183.0 lb

## 2022-02-18 DIAGNOSIS — M5441 Lumbago with sciatica, right side: Secondary | ICD-10-CM

## 2022-02-18 DIAGNOSIS — M5442 Lumbago with sciatica, left side: Secondary | ICD-10-CM | POA: Diagnosis not present

## 2022-02-18 DIAGNOSIS — G8929 Other chronic pain: Secondary | ICD-10-CM

## 2022-02-18 NOTE — Progress Notes (Signed)
My back still hurts.  He has not been able to get to PT secondary to scheduling issues.  They will see him September 29th.  He continues to have lower back pain, more on the right lower back with some pain to the buttock area.  He has no new trauma. He is taking his medicine which helps some.  Spine/Pelvis examination:  Inspection:  Overall, sacoiliac joint benign and hips nontender; without crepitus or defects.   Thoracic spine inspection: Alignment normal without kyphosis present   Lumbar spine inspection:  Alignment  with normal lumbar lordosis, without scoliosis apparent.   Thoracic spine palpation:  without tenderness of spinal processes   Lumbar spine palpation: without tenderness of lumbar area; without tightness of lumbar muscles    Range of Motion:   Lumbar flexion, forward flexion is normal without pain or tenderness    Lumbar extension is full without pain or tenderness   Left lateral bend is normal without pain or tenderness   Right lateral bend is normal without pain or tenderness   Straight leg raising is normal  Strength & tone: normal   Stability overall normal stability  Encounter Diagnosis  Name Primary?   Chronic bilateral low back pain with bilateral sciatica Yes   Go to PT.  Consider MRI next visit if not improved.  He is to have disability hearing next week.  Call if any problem.  Precautions discussed.  Electronically Signed Sanjuana Kava, MD 9/21/20239:47 AM

## 2022-02-18 NOTE — Patient Instructions (Signed)
Make sure you keep your physical therapy appointment.

## 2022-02-22 DIAGNOSIS — R69 Illness, unspecified: Secondary | ICD-10-CM | POA: Diagnosis not present

## 2022-02-22 DIAGNOSIS — Z79899 Other long term (current) drug therapy: Secondary | ICD-10-CM | POA: Diagnosis not present

## 2022-02-26 ENCOUNTER — Other Ambulatory Visit: Payer: Self-pay

## 2022-02-26 ENCOUNTER — Ambulatory Visit (HOSPITAL_COMMUNITY): Payer: 59 | Attending: Orthopaedic Surgery

## 2022-02-26 DIAGNOSIS — G8929 Other chronic pain: Secondary | ICD-10-CM | POA: Diagnosis not present

## 2022-02-26 DIAGNOSIS — M5442 Lumbago with sciatica, left side: Secondary | ICD-10-CM | POA: Insufficient documentation

## 2022-02-26 DIAGNOSIS — R5383 Other fatigue: Secondary | ICD-10-CM

## 2022-02-26 DIAGNOSIS — M545 Low back pain, unspecified: Secondary | ICD-10-CM | POA: Diagnosis not present

## 2022-02-26 DIAGNOSIS — M5441 Lumbago with sciatica, right side: Secondary | ICD-10-CM | POA: Diagnosis not present

## 2022-02-26 NOTE — Therapy (Signed)
OUTPATIENT PHYSICAL THERAPY THORACOLUMBAR EVALUATION   Patient Name: Andre Lynch MRN: [id number] DOB:06-02-1980, 41 y.o., male Today's Date: 02/26/2022   PT End of Session - 02/26/22 0902     Visit Number 1    Number of Visits 6    Date for PT Re-Evaluation 04/09/22    Authorization Type [employer]; no ded; no copay, no VL, no auth required    Progress Note Due on Visit 6    PT Start Time 0900    PT Stop Time 0944    PT Time Calculation (min) 44 min    Activity Tolerance Patient limited by pain;Patient tolerated treatment well    Behavior During Therapy [facility] for tasks assessed/performed             Past Medical History:  Diagnosis Date   Abdominal distension 10/14/2021   Anxiety    Back pain    Breast tenderness 10/14/2021   Chronic hepatitis C without hepatic coma (HCC) 09/17/2021   Chronic knee pain    Depression    Gastric ulcer    Hepatitis-C    History of narcotic addiction (HCC)    methadone tx   Intertrigo 10/14/2021   IVDU (intravenous drug user) 09/17/2021   Kidney stones    Lower extremity edema 09/17/2021   Lumbago 10/14/2021   RLS (restless legs syndrome)    Seizures (HCC)    last one early 2018   Substance abuse (HCC)    cocaine, benzos, marijuana, benzos, opiates   Past Surgical History:  Procedure Laterality Date   HERNIA REPAIR     WISDOM TOOTH EXTRACTION     Patient Active Problem List   Diagnosis Date Noted   ADHD 02/10/2022   Tobacco use 02/10/2022   Fatigue 02/10/2022   Preventative health care 02/10/2022   Chronic bilateral low back pain with sciatica 10/14/2021   Abdominal distension 10/14/2021   Breast tenderness 10/14/2021   Intertrigo 10/14/2021   Chronic hepatitis C without hepatic coma (HCC) 09/17/2021   IVDU (intravenous drug user) 09/17/2021   Lower extremity edema 09/17/2021   Hepatitis C antibody positive in blood 06/23/2021   Localized edema 06/23/2021   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/27/2020   Mental  health disorder    Acute encephalopathy 06/25/2020   Substance use disorder 06/25/2020   Chronic pain syndrome 06/25/2020   Benzodiazepine withdrawal without complication (HCC)    Seizures (HCC) 06/24/2020   H/O: substance abuse (HCC) 07/28/2012   GAD (generalized anxiety disorder) 07/28/2012    PCP: Billie Lade, MD  REFERRING PROVIDER: Darreld Mclean, MD   REFERRING DIAG: M54.42,M54.41,G89.29 (ICD-10-CM) - Chronic bilateral low back pain with bilateral sciatica   Rationale for Evaluation and Treatment Rehabilitation  THERAPY DIAG:  Low back pain, unspecified back pain laterality, unspecified chronicity, unspecified whether sciatica present  Chronic bilateral low back pain with bilateral sciatica  ONSET DATE: 2020  SUBJECTIVE:  SUBJECTIVE STATEMENT: Has had back trouble for years; in 2020 fell off ladder and landed on tailbone; woke up next morning right leg was numb. Saw [facility]; x-ray degenerative changes; no surgical history. Pain is still in lower back and then up and down back more on right side. Disturbs sleep; increased pain with standing more than 5 minutes.1/10 to  9/10 at most  PERTINENT HISTORY:  Waiting on disability Low testosterone Walked to therapy lives less than a mile away  PAIN:  Are you having pain? Yes: NPRS scale: 5/10 Pain location: low back right side more than left Pain description: aching, stabbing Aggravating factors: prolonged standing more than 5 minutes Relieving factors: sitting in chair with good posture and support.   PRECAUTIONS: None  WEIGHT BEARING RESTRICTIONS No  FALLS:  Has patient fallen in last 6 months? No  LIVING ENVIRONMENT: Lives with: lives with their family Lives in: House/apartment Stairs: Yes: External: 2 steps; on right going  up, on left going up, and can reach both Has following equipment at home: None  OCCUPATION: disability  PLOF: Independent  PATIENT GOALS get my back feeling better   OBJECTIVE:   DIAGNOSTIC FINDINGS:  Trying to get an MRI ordered  PATIENT SURVEYS:  FOTO 39  COGNITION:  Overall cognitive status: Within functional limits for tasks assessed     SENSATION: WFL   POSTURE: rounded shoulders; guarded posture  PALPATION: Tender lumbar paraspinals from T12 down to S1; muscle tightness noted thoracic spine  LUMBAR ROM:   Active  AROM  eval  Flexion 80% available  Extension 30% available **  Right lateral flexion 40% available**  Left lateral flexion 40% available**  Right rotation   Left rotation    (Blank rows = not tested)   LOWER EXTREMITY MMT:    MMT Right eval Left eval  Hip flexion 4-/5 4+/5  Hip extension 3+ 3+  Hip abduction    Hip adduction    Hip internal rotation    Hip external rotation    Knee flexion 3 3-  Knee extension 5 5  Ankle dorsiflexion 5 5  Ankle plantarflexion    Ankle inversion    Ankle eversion     (Blank rows = not tested)  FUNCTIONAL TESTS:  5 times sit to stand: 15.52 sec increased pain 2 minute walk test: not tested    TODAY'S TREATMENT  Physical therapy evaluation and HEP.   PATIENT EDUCATION:  Education details: Patient educated on exam findings, POC, scope of PT, HEP, and good sitting posture. Person educated: Patient Education method: Explanation, Demonstration, and Handouts Education comprehension: verbalized understanding, returned demonstration, verbal cues required, and tactile cues required   HOME EXERCISE PROGRAM: Access Code: [email or web address] URL: [email or web address] Date: 02/26/2022 Prepared by: [facility] - Rehab  Exercises - Lying Prone  - 3 x daily - 7 x weekly - 3 sets - 10 reps - 5-10 min hold - Supine Lower Trunk Rotation  - 3 x daily - 7 x weekly - 1 sets - 10 reps - 2-3"hold hold - Supine  Gluteal Sets  - 3 x daily - 7 x weekly - 1 sets - 10 reps - 5" hold - Correct Seated Posture  - 1 x daily - 7 x weekly - 1 sets - 1 reps  ASSESSMENT:  CLINICAL IMPRESSION: Patient is a 41 y.o. male who was seen today for physical therapy evaluation and treatment for M54.42,M54.41,G89.29 (ICD-10-CM) - Chronic bilateral low back pain with bilateral sciatica . Patient presents on evaluation with  back pain, decreased lumbar mobility, leg weakness, tenderness on palpation to lumbar spine; impaired posturing all of which negatively impact his ability to work, to sleep and to stand for a prolonged period of time to perform any tasks   OBJECTIVE IMPAIRMENTS decreased activity tolerance, decreased endurance, decreased knowledge of condition, decreased mobility, decreased ROM, decreased strength, hypomobility, increased fascial restrictions, impaired perceived functional ability, increased muscle spasms, impaired flexibility, postural dysfunction, and pain.   ACTIVITY LIMITATIONS carrying, lifting, bending, sitting, standing, squatting, sleeping, stairs, transfers, reach over head, hygiene/grooming, locomotion level, and caring for others  PARTICIPATION LIMITATIONS: meal prep, cleaning, laundry, community activity, occupation, and yard work   [employer] POTENTIAL: Good  CLINICAL DECISION MAKING: Stable/uncomplicated  EVALUATION COMPLEXITY: Low   GOALS: Goals reviewed with patient? No  SHORT TERM GOALS: Target date: 03/19/2022   patient will be independent with initial HEP  Baseline: Goal status: INITIAL  2.  Patient will improve 5 x STS score from 15.52 sec to 14 sec to demonstrate improved functional mobility and increased lower extremity strength.  Baseline:  Goal status: INITIAL  LONG TERM GOALS: Target date: 04/09/2022  Patient will be independent in self management strategies to improve quality of life and functional outcomes.  Baseline:  Goal status: INITIAL  2.  Patient will  improve FOTO score to predicted value to demonstrate improved functional mobility  Baseline: 39 Goal status: INITIAL  3.  Patient will report at least 50% improvement in overall symptoms and/or function to demonstrate improved functional mobility  Baseline:  Goal status: INITIAL  4.  Patient will improve 5 x STS score from 15.54 sec to 12 sec to demonstrate improved functional mobility and increased lower extremity strength.  Baseline:  Goal status: INITIAL  5.   Patient will increase tested lower extremity MMT's to 5/5 to promote return to ambulation community distances with minimal deviation.  Baseline: see above Goal status: INITIAL   PLAN: PT FREQUENCY: 1x/week  PT DURATION: 6 weeks  PLANNED INTERVENTIONS: Therapeutic exercises, Therapeutic activity, Neuromuscular re-education, Balance training, Gait training, Patient/Family education, Joint manipulation, Joint mobilization, Stair training, Orthotic/Fit training, DME instructions, Aquatic Therapy, Dry Needling, Electrical stimulation, Spinal manipulation, Spinal mobilization, Cryotherapy, Moist heat, Compression bandaging, scar mobilization, Splintting, Taping, Traction, Ultrasound, Ionotophoresis 4mg /ml Dexamethasone, and Manual therapy   PLAN FOR NEXT SESSION: Review HEP and goals.; lumbar mobility and core strengthening   10:04 AM, 02/26/22 Azra Abrell Small Rithika Seel MPT [facility] [place] [phone]

## 2022-03-02 ENCOUNTER — Ambulatory Visit (INDEPENDENT_AMBULATORY_CARE_PROVIDER_SITE_OTHER): Payer: 59 | Admitting: Infectious Disease

## 2022-03-02 ENCOUNTER — Other Ambulatory Visit: Payer: Self-pay

## 2022-03-02 ENCOUNTER — Encounter: Payer: Self-pay | Admitting: Infectious Disease

## 2022-03-02 VITALS — BP 131/92 | HR 68 | Temp 97.5°F | Resp 16 | Wt 185.6 lb

## 2022-03-02 DIAGNOSIS — B182 Chronic viral hepatitis C: Secondary | ICD-10-CM

## 2022-03-02 DIAGNOSIS — M5442 Lumbago with sciatica, left side: Secondary | ICD-10-CM

## 2022-03-02 DIAGNOSIS — R69 Illness, unspecified: Secondary | ICD-10-CM | POA: Diagnosis not present

## 2022-03-02 DIAGNOSIS — G8929 Other chronic pain: Secondary | ICD-10-CM

## 2022-03-02 DIAGNOSIS — Z23 Encounter for immunization: Secondary | ICD-10-CM

## 2022-03-02 DIAGNOSIS — Z7185 Encounter for immunization safety counseling: Secondary | ICD-10-CM | POA: Diagnosis not present

## 2022-03-02 DIAGNOSIS — E291 Testicular hypofunction: Secondary | ICD-10-CM | POA: Insufficient documentation

## 2022-03-02 DIAGNOSIS — M5441 Lumbago with sciatica, right side: Secondary | ICD-10-CM | POA: Diagnosis not present

## 2022-03-02 DIAGNOSIS — R7989 Other specified abnormal findings of blood chemistry: Secondary | ICD-10-CM | POA: Diagnosis not present

## 2022-03-02 HISTORY — DX: Other specified abnormal findings of blood chemistry: R79.89

## 2022-03-02 NOTE — Addendum Note (Signed)
Addended by: Tomi Bamberger on: 03/02/2022 09:31 AM   Modules accepted: Orders

## 2022-03-02 NOTE — Telephone Encounter (Signed)
scheduled

## 2022-03-02 NOTE — Addendum Note (Signed)
Addended by: Marland Kitchen E on: 03/02/2022 03:11 PM   Modules accepted: Orders

## 2022-03-02 NOTE — Progress Notes (Signed)
Subjective:   Chief complaints : Continued low back pain that at times can be an 8 out of 10 in severity but currently 4 out of 10 in severity concerns about low testosterone dysfunction  Patient ID: Andre Lynch, male    DOB: 1980/06/14, 41 y.o.   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  He returned in had tested positive for hepatitis C which he believes he acquired through sex as he is adamant he has not been using IV drugs recently and that he tested negative while in the treatment facility.  He was seen in [place] by gastroenterology and labs have shown him to have  genotype 1a. Elastography showed no evidence of cirrhosis.  Apparently since he has been back from [place] he has had LE edema and abdominal pain and distention as well as lower back pain  He denies IVDU recently. He is rx adderal for ADHD. We will visit ago when I saw him had some concerns that he still could be using drugs and that even if he was not that he could have had an infection that got in the bloodstream and seeded his spine.  Indeed his inflammatory markers were elevated when checked them in clinic.  I have endeavored to get an MRI of the spine but it was been denied by his insurance.  He has now gotten through 3 months of Epclusa.  We will check a hep C viral load today.  He also needs a second hepatitis B vaccine.  He is still having low back pain and seeing physical therapy I will try again to get an MRI ordered and hopefully approved this time for him.  He has multiple other concerns such as low testosterone problems with thyroid dysfunction.  I noted that he had been prescribed Truvada it appears in 2022 but he believes that this was given to everyone that was part of his  rehabilitation clinic he was in.  He denies need for HIV preexposure prophylaxis denies any ongoing problems with intravenous drug use.        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   Intertrigo 10/14/2021   IVDU (intravenous drug user) 09/17/2021   Kidney stones    Lower extremity edema 09/17/2021   Lumbago 10/14/2021   RLS (restless legs syndrome)    Seizures ([place])    last one early 2018   Substance abuse ([facility])    cocaine, benzos, marijuana, benzos, opiates    Past Surgical History:  Procedure Laterality Date   HERNIA REPAIR     WISDOM TOOTH EXTRACTION      Family History  Problem Relation Age of Onset   Cancer Mother        breast cancer survivor      Social History   Socioeconomic History   Marital status: Single    Spouse name: Not on file   Number of children: Not on file  Years of education: Not on file   Highest education level: Not on file  Occupational History   Occupation: unemployed  Tobacco Use   Smoking status: Every Day    Packs/day: 0.50    Years: 20.00    Total pack years: 10.00    Types: Cigarettes   Smokeless tobacco: Never  Vaping Use   Vaping Use: Some days  Substance and Sexual Activity   Alcohol use: Not Currently   Drug use: Not Currently   Sexual activity: Not Currently    Birth control/protection: None  Other Topics Concern   Not on file  Social History Narrative   Not on file   Social Determinants of Health   Financial Resource Strain: Not on file  Food Insecurity: Not on file  Transportation Needs: Not on file  Physical Activity: Not on file  Stress: Not on file  Social Connections: Not on file    Allergies  Allergen Reactions   Ibuprofen Other  (See Comments)    Cannot take due to stomach ulcers   Tylenol [Acetaminophen] Other (See Comments)    Cannot take due to stomach ulcers     Current Outpatient Medications:    CAPLYTA 42 MG capsule, SMARTSIG:1 Capsule(s) By Mouth Every Evening, Disp: , Rfl:    clonazePAM (KLONOPIN) 1 MG tablet, Take 1 mg by mouth 3 (three) times daily as needed., Disp: , Rfl:    EPCLUSA 400-100 MG TABS, Take 1 tablet by mouth daily., Disp: 28 tablet, Rfl: 2   gabapentin (NEURONTIN) 600 MG tablet, Take 600 mg by mouth 3 (three) times daily., Disp: , Rfl:    methocarbamol (ROBAXIN) 500 MG tablet, Take 1 tablet (500 mg total) by mouth 3 (three) times daily., Disp: 60 tablet, Rfl: 1   methylphenidate 54 MG PO CR tablet, Take 54 mg by mouth every morning., Disp: , Rfl:    propranolol (INDERAL) 20 MG tablet, Take 10-20 mg by mouth 2 (two) times daily as needed., Disp: , Rfl:    Review of Systems  Constitutional:  Negative for activity change, appetite change, chills, diaphoresis, fatigue, fever and unexpected weight change.  HENT:  Negative for congestion, rhinorrhea, sinus pressure, sneezing, sore throat and trouble swallowing.   Eyes:  Negative for photophobia and visual disturbance.  Respiratory:  Negative for cough, chest tightness, shortness of breath, wheezing and stridor.   Cardiovascular:  Negative for chest pain, palpitations and leg swelling.  Gastrointestinal:  Negative for abdominal distention, abdominal pain, anal bleeding, blood in stool, constipation, diarrhea, nausea and vomiting.  Genitourinary:  Negative for difficulty urinating, dysuria, flank pain and hematuria.  Musculoskeletal:  Positive for back pain. Negative for arthralgias, gait problem, joint swelling and myalgias.  Skin:  Negative for color change, pallor, rash and wound.  Neurological:  Negative for dizziness, tremors, weakness and light-headedness.  Hematological:  Negative for adenopathy. Does not bruise/bleed easily.   Psychiatric/Behavioral:  Negative for agitation, behavioral problems, confusion, decreased concentration, dysphoric mood and sleep disturbance.        Objective:   Physical Exam Constitutional:      Appearance: He is well-developed.  HENT:     Head: Normocephalic and atraumatic.  Eyes:     Conjunctiva/sclera: Conjunctivae normal.  Cardiovascular:     Rate and Rhythm: Normal rate and regular rhythm.  Pulmonary:     Effort: Pulmonary effort is normal. No respiratory distress.     Breath sounds: No wheezing.  Abdominal:     General: There is no distension.  Palpations: Abdomen is soft.  Musculoskeletal:        General: No tenderness. Normal range of motion.     Cervical back: Normal range of motion and neck supple.  Skin:    General: Skin is warm and dry.     Coloration: Skin is not pale.     Findings: No erythema or rash.  Neurological:     General: No focal deficit present.     Mental Status: He is alert and oriented to person, place, and time.  Psychiatric:        Mood and Affect: Mood normal.        Behavior: Behavior normal.        Thought Content: Thought content normal.        Judgment: Judgment normal.           Assessment & Plan:  Chronic hepatitis C without hepatic coma:  Check hep C viral load today check SVR 12 in December.  Vaccine counseling recommended and he received second hepatitis B vaccine.  Also still needs second hepatitis A vaccine repair  Low back pain: I have ordered MRI with and without contrast I am also checking a sed rate and CRP to investigate for potential infectious causes of back pain such as discitis.  Low testosterone and concerns about thyroid function: He can take this up with PCP.

## 2022-03-03 ENCOUNTER — Ambulatory Visit (HOSPITAL_COMMUNITY): Payer: 59 | Attending: Orthopaedic Surgery

## 2022-03-03 ENCOUNTER — Other Ambulatory Visit: Payer: Self-pay | Admitting: *Deleted

## 2022-03-03 DIAGNOSIS — M5441 Lumbago with sciatica, right side: Secondary | ICD-10-CM | POA: Diagnosis not present

## 2022-03-03 DIAGNOSIS — R7989 Other specified abnormal findings of blood chemistry: Secondary | ICD-10-CM | POA: Diagnosis not present

## 2022-03-03 DIAGNOSIS — M5442 Lumbago with sciatica, left side: Secondary | ICD-10-CM | POA: Diagnosis not present

## 2022-03-03 DIAGNOSIS — M545 Low back pain, unspecified: Secondary | ICD-10-CM | POA: Diagnosis not present

## 2022-03-03 DIAGNOSIS — G8929 Other chronic pain: Secondary | ICD-10-CM

## 2022-03-03 NOTE — Therapy (Signed)
OUTPATIENT PHYSICAL THERAPY THORACOLUMBAR EVALUATION   Patient Name: Andre Lynch MRN: [id number] DOB:1981/03/17, 41 y.o., male Today's Date: 03/03/2022   PT End of Session - 03/03/22 0809     Visit Number 2    Number of Visits 6    Date for PT Re-Evaluation 04/09/22    Authorization Type [employer] [place]; no ded; no copay, no VL, no auth required    Progress Note Due on Visit 6    PT Start Time 0807    PT Stop Time 0846    PT Time Calculation (min) 39 min    Activity Tolerance Patient limited by pain;Patient tolerated treatment well    Behavior During Therapy [facility] for tasks assessed/performed              Past Medical History:  Diagnosis Date   Abdominal distension 10/14/2021   Anxiety    Back pain    Breast tenderness 10/14/2021   Chronic hepatitis C without hepatic coma ([place]) 09/17/2021   Chronic knee pain    Depression    Gastric ulcer    Hepatitis-C    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   Intertrigo 10/14/2021   IVDU (intravenous drug user) 09/17/2021   Kidney stones    Low testosterone 03/02/2022   Lower extremity edema 09/17/2021   Lumbago 10/14/2021   RLS (restless legs syndrome)    Seizures ([place])    last one early 2018   Substance abuse ([place])    cocaine, benzos, marijuana, benzos, opiates   Past Surgical History:  Procedure Laterality Date   HERNIA REPAIR     WISDOM TOOTH EXTRACTION     Patient Active Problem List   Diagnosis Date Noted   Low testosterone 03/02/2022   ADHD 02/10/2022   Tobacco use 02/10/2022   Fatigue 02/10/2022   Preventative health care 02/10/2022   Chronic bilateral low back pain with sciatica 10/14/2021   Abdominal distension 10/14/2021   Breast tenderness 10/14/2021   Intertrigo 10/14/2021   Chronic hepatitis C without hepatic coma ([place]) 09/17/2021   IVDU (intravenous drug user) 09/17/2021   Lower extremity edema 09/17/2021   Hepatitis C antibody positive in blood 06/23/2021   Localized edema  06/23/2021   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[place]) 06/27/2020   Mental health disorder    Acute encephalopathy 06/25/2020   Substance use disorder 06/25/2020   Chronic pain syndrome 06/25/2020   Benzodiazepine withdrawal without complication ([place])    Seizures ([place]) 06/24/2020   H/O: substance abuse ([place]) 07/28/2012   GAD (generalized anxiety disorder) 07/28/2012    PCP: Johnette Abraham, MD  REFERRING PROVIDER: Sanjuana Kava, MD   REFERRING DIAG: M54.42,M54.41,G89.29 (ICD-10-CM) - Chronic bilateral low back pain with bilateral sciatica   Rationale for Evaluation and Treatment Rehabilitation  THERAPY DIAG:  Low back pain, unspecified back pain laterality, unspecified chronicity, unspecified whether sciatica present  Chronic bilateral low back pain with bilateral sciatica  ONSET DATE: 2020  SUBJECTIVE:  SUBJECTIVE STATEMENT: Larey Seat out of bed yesterday;states left leg gave way; back is hurting pretty badly this morning; did exercises from HEP a couple of days.  Eval:Has had back trouble for years; in 2020 fell off ladder and landed on tailbone; woke up next morning right leg was numb. Saw [facility]; x-ray degenerative changes; no surgical history. Pain is still in lower back and then up and down back more on right side. Disturbs sleep; increased pain with standing more than 5 minutes.1/10 to  9/10 at most  PERTINENT HISTORY:  Waiting on disability Low testosterone Walked to therapy lives less than a mile away  PAIN:  Are you having pain? Yes: NPRS scale: 6/10 Pain location: low back right side more than left Pain description: aching, stabbing Aggravating factors: prolonged standing more than 5 minutes Relieving factors: sitting in chair with good posture and support.   PRECAUTIONS: None  WEIGHT  BEARING RESTRICTIONS No  FALLS:  Has patient fallen in last 6 months? No  LIVING ENVIRONMENT: Lives with: lives with their family Lives in: House/apartment Stairs: Yes: External: 2 steps; on right going up, on left going up, and can reach both Has following equipment at home: None  OCCUPATION: disability  PLOF: Independent  PATIENT GOALS get my back feeling better   OBJECTIVE:   DIAGNOSTIC FINDINGS:  Trying to get an MRI ordered  PATIENT SURVEYS:  FOTO 39  COGNITION:  Overall cognitive status: Within functional limits for tasks assessed     SENSATION: WFL   POSTURE: rounded shoulders; guarded posture  PALPATION: Tender lumbar paraspinals from T12 down to S1; muscle tightness noted thoracic spine  LUMBAR ROM:   Active  AROM  eval  Flexion 80% available  Extension 30% available **  Right lateral flexion 40% available**  Left lateral flexion 40% available**  Right rotation   Left rotation    (Blank rows = not tested)   LOWER EXTREMITY MMT:    MMT Right eval Left eval  Hip flexion 4-/5 4+/5  Hip extension 3+ 3+  Hip abduction    Hip adduction    Hip internal rotation    Hip external rotation    Knee flexion 3 3-  Knee extension 5 5  Ankle dorsiflexion 5 5  Ankle plantarflexion    Ankle inversion    Ankle eversion     (Blank rows = not tested)  FUNCTIONAL TESTS:  5 times sit to stand: 15.52 sec increased pain 2 minute walk test: not tested    TODAY'S TREATMENT  03/03/22 Review of HEP and goals Prone: Prone lying x 5' with moist heat Glute sets 5" hold x 10  Supine: LTR x 15 Abdominal bracing 5" x 10 Abdominal bracing with march 2 x 10 Abdominal bracing with hip adduction with ball 5" hold x 10 Abdominal bracing with hip abduction with RTB 2 x 10 Bridge x 5     Physical therapy evaluation and HEP.   PATIENT EDUCATION:  Education details: Patient educated on exam findings, POC, scope of PT, HEP, and good sitting  posture. Person educated: Patient Education method: Explanation, Demonstration, and Handouts Education comprehension: verbalized understanding, returned demonstration, verbal cues required, and tactile cues required   HOME EXERCISE PROGRAM: Access Code: [email or web address] URL: [email or web address] Date: 03/03/2022 Prepared by: [facility] - Rehab  Exercises - Lying Prone  - 3 x daily - 7 x weekly - 3 sets - 10 reps - 5-10 min hold - Supine Lower Trunk Rotation  - 3 x daily - 7 x  weekly - 1 sets - 10 reps - 2-3"hold hold - Supine Gluteal Sets  - 3 x daily - 7 x weekly - 1 sets - 10 reps - 5" hold - Correct Seated Posture  - 1 x daily - 7 x weekly - 1 sets - 1 reps - Supine Transversus Abdominis Bracing - Hands on Stomach  - 3 x daily - 7 x weekly - 1 sets - 10 reps - 5" hold - Supine March  - 3 x daily - 7 x weekly - 2 sets - 10 reps - Supine Hip Adduction Isometric with Ball  - 3 x daily - 7 x weekly - 1 sets - 10 reps - Hooklying Clamshell with Resistance  - 3 x daily - 7 x weekly - 2 sets - 10 reps  ASSESSMENT:  CLINICAL IMPRESSION: Today's session started with review of HEP and goals.  Patient verbalizes agreement with set rehab goals.Progressed core stabilization program today without issue.  Updated HEP. Trial of bridge as patient is quite weak with hip extensors; he has some increased pain so limited reps.  Patient does well with transverse abdominus contraction but needs verbal cues for proper breathing. Patient will benefit from continued skilled therapy services to address deficits and promote return to optimal function.      OBJECTIVE IMPAIRMENTS decreased activity tolerance, decreased endurance, decreased knowledge of condition, decreased mobility, decreased ROM, decreased strength, hypomobility, increased fascial restrictions, impaired perceived functional ability, increased muscle spasms, impaired flexibility, postural dysfunction, and pain.   ACTIVITY LIMITATIONS carrying,  lifting, bending, sitting, standing, squatting, sleeping, stairs, transfers, reach over head, hygiene/grooming, locomotion level, and caring for others  PARTICIPATION LIMITATIONS: meal prep, cleaning, laundry, community activity, occupation, and yard work   [facility] POTENTIAL: Good  CLINICAL DECISION MAKING: Stable/uncomplicated  EVALUATION COMPLEXITY: Low   GOALS: Goals reviewed with patient? Yes  SHORT TERM GOALS: Target date: 03/19/2022   patient will be independent with initial HEP  Baseline: Goal status: IN PROGRESS  2.  Patient will improve 5 x STS score from 15.52 sec to 14 sec to demonstrate improved functional mobility and increased lower extremity strength.  Baseline:  Goal status: IN PROGRESS  LONG TERM GOALS: Target date: 04/09/2022  Patient will be independent in self management strategies to improve quality of life and functional outcomes.  Baseline:  Goal status: IN PROGRESS  2.  Patient will improve FOTO score to predicted value to demonstrate improved functional mobility  Baseline: 39 Goal status: IN PROGRESS  3.  Patient will report at least 50% improvement in overall symptoms and/or function to demonstrate improved functional mobility  Baseline:  Goal status: IN PROGRESS  4.  Patient will improve 5 x STS score from 15.54 sec to 12 sec to demonstrate improved functional mobility and increased lower extremity strength.  Baseline:  Goal status: IN PROGRESS  5.   Patient will increase tested lower extremity MMT's to 5/5 to promote return to ambulation community distances with minimal deviation.  Baseline: see above Goal status: IN PROGRESS   PLAN: PT FREQUENCY: 1x/week  PT DURATION: 6 weeks  PLANNED INTERVENTIONS: Therapeutic exercises, Therapeutic activity, Neuromuscular re-education, Balance training, Gait training, Patient/Family education, Joint manipulation, Joint mobilization, Stair training, Orthotic/Fit training, DME instructions,  Aquatic Therapy, Dry Needling, Electrical stimulation, Spinal manipulation, Spinal mobilization, Cryotherapy, Moist heat, Compression bandaging, scar mobilization, Splintting, Taping, Traction, Ultrasound, Ionotophoresis 4mg /ml Dexamethasone, and Manual therapy   PLAN FOR NEXT SESSION:  lumbar mobility and core strengthening; bridge;  MRI scheduled for Sat.  8:50 AM, 03/03/22 [facility] Car[facility] Small Jamese Trauger MPT [place] [facility] [place] [phone]

## 2022-03-04 LAB — CBC WITH DIFFERENTIAL/PLATELET
Absolute Monocytes: 371 cells/uL (ref 200–950)
Basophils Absolute: 21 cells/uL (ref 0–200)
Basophils Relative: 0.4 %
Eosinophils Absolute: 148 cells/uL (ref 15–500)
Eosinophils Relative: 2.8 %
HCT: 45 % (ref 38.5–50.0)
Hemoglobin: 15.1 g/dL (ref 13.2–17.1)
Lymphs Abs: 2443 cells/uL (ref 850–3900)
MCH: 32.8 pg (ref 27.0–33.0)
MCHC: 33.6 g/dL (ref 32.0–36.0)
MCV: 97.6 fL (ref 80.0–100.0)
MPV: 10.5 fL (ref 7.5–12.5)
Monocytes Relative: 7 %
Neutro Abs: 2316 cells/uL (ref 1500–7800)
Neutrophils Relative %: 43.7 %
Platelets: 283 10*3/uL (ref 140–400)
RBC: 4.61 10*6/uL (ref 4.20–5.80)
RDW: 11.9 % (ref 11.0–15.0)
Total Lymphocyte: 46.1 %
WBC: 5.3 10*3/uL (ref 3.8–10.8)

## 2022-03-04 LAB — COMPLETE METABOLIC PANEL WITH GFR
AG Ratio: 1.6 (calc) (ref 1.0–2.5)
ALT: 9 U/L (ref 9–46)
AST: 13 U/L (ref 10–40)
Albumin: 4.1 g/dL (ref 3.6–5.1)
Alkaline phosphatase (APISO): 59 U/L (ref 36–130)
BUN: 11 mg/dL (ref 7–25)
CO2: 26 mmol/L (ref 20–32)
Calcium: 9.4 mg/dL (ref 8.6–10.3)
Chloride: 105 mmol/L (ref 98–110)
Creat: 0.93 mg/dL (ref 0.60–1.29)
Globulin: 2.6 g/dL (calc) (ref 1.9–3.7)
Glucose, Bld: 87 mg/dL (ref 65–99)
Potassium: 4 mmol/L (ref 3.5–5.3)
Sodium: 139 mmol/L (ref 135–146)
Total Bilirubin: 0.3 mg/dL (ref 0.2–1.2)
Total Protein: 6.7 g/dL (ref 6.1–8.1)
eGFR: 105 mL/min/{1.73_m2} (ref >=60)

## 2022-03-04 LAB — C-REACTIVE PROTEIN: CRP: 2.6 mg/L (ref <8.0)

## 2022-03-04 LAB — HEPATITIS C RNA QUANTITATIVE
HCV Quantitative Log: <1.18 log IU/mL
HCV RNA, PCR, QN: <15 IU/mL

## 2022-03-04 LAB — SEDIMENTATION RATE: Sed Rate: 2 mm/h (ref 0–15)

## 2022-03-05 ENCOUNTER — Encounter: Payer: Self-pay | Admitting: Infectious Disease

## 2022-03-05 LAB — TESTOSTERONE,FREE AND TOTAL
Testosterone, Free: 1.8 pg/mL — ABNORMAL LOW (ref 6.8–21.5)
Testosterone: 235 ng/dL — ABNORMAL LOW (ref 264–916)

## 2022-03-06 ENCOUNTER — Ambulatory Visit (HOSPITAL_COMMUNITY): Payer: 59

## 2022-03-08 ENCOUNTER — Telehealth: Payer: Self-pay

## 2022-03-08 ENCOUNTER — Other Ambulatory Visit: Payer: Self-pay | Admitting: Infectious Disease

## 2022-03-08 DIAGNOSIS — F1990 Other psychoactive substance use, unspecified, uncomplicated: Secondary | ICD-10-CM

## 2022-03-08 DIAGNOSIS — G8929 Other chronic pain: Secondary | ICD-10-CM

## 2022-03-08 NOTE — Telephone Encounter (Signed)
Informed patient - per Bing Quarry, referral coordinator - MRI authorization was denied and canceled and she was told by patient that another provider will order MRI. Informed patient that since orthopedic is managing back pain. He would need to contact their office for further imaging regarding back pain. Patient voiced his understanding.  [place], CMA

## 2022-03-09 ENCOUNTER — Ambulatory Visit (HOSPITAL_COMMUNITY): Payer: 59 | Admitting: Physical Therapy

## 2022-03-09 ENCOUNTER — Encounter (HOSPITAL_COMMUNITY): Payer: Self-pay | Admitting: Physical Therapy

## 2022-03-09 DIAGNOSIS — M5441 Lumbago with sciatica, right side: Secondary | ICD-10-CM | POA: Diagnosis not present

## 2022-03-09 DIAGNOSIS — M5442 Lumbago with sciatica, left side: Secondary | ICD-10-CM | POA: Diagnosis not present

## 2022-03-09 DIAGNOSIS — M545 Low back pain, unspecified: Secondary | ICD-10-CM

## 2022-03-09 DIAGNOSIS — G8929 Other chronic pain: Secondary | ICD-10-CM | POA: Diagnosis not present

## 2022-03-09 LAB — FSH/LH
FSH: 3 m[IU]/mL (ref 1.5–12.4)
LH: 3 m[IU]/mL (ref 1.7–8.6)

## 2022-03-09 LAB — TESTOSTERONE,FREE AND TOTAL
Testosterone, Free: 2.7 pg/mL — ABNORMAL LOW (ref 6.8–21.5)
Testosterone: 320 ng/dL (ref 264–916)

## 2022-03-09 NOTE — Therapy (Signed)
OUTPATIENT PHYSICAL THERAPY THORACOLUMBAR EVALUATION   Patient Name: Andre Lynch MRN: [id number] DOB:1980-10-24, 41 y.o., male Today's Date: 03/09/2022   PT End of Session - 03/09/22 0807     Visit Number 3    Number of Visits 6    Date for PT Re-Evaluation 04/09/22    Authorization Type [employer]; no ded; no copay, no VL, no auth required    Progress Note Due on Visit 6    PT Start Time 0806    PT Stop Time 0850    PT Time Calculation (min) 44 min    Activity Tolerance Patient limited by pain;Patient tolerated treatment well    Behavior During Therapy [facility] for tasks assessed/performed              Past Medical History:  Diagnosis Date   Abdominal distension 10/14/2021   Anxiety    Back pain    Breast tenderness 10/14/2021   Chronic hepatitis C without hepatic coma (HCC) 09/17/2021   Chronic knee pain    Depression    Gastric ulcer    Hepatitis-C    History of narcotic addiction (HCC)    methadone tx   Intertrigo 10/14/2021   IVDU (intravenous drug user) 09/17/2021   Kidney stones    Low testosterone 03/02/2022   Lower extremity edema 09/17/2021   Lumbago 10/14/2021   RLS (restless legs syndrome)    Seizures (HCC)    last one early 2018   Substance abuse (HCC)    cocaine, benzos, marijuana, benzos, opiates   Past Surgical History:  Procedure Laterality Date   HERNIA REPAIR     WISDOM TOOTH EXTRACTION     Patient Active Problem List   Diagnosis Date Noted   Low testosterone 03/02/2022   ADHD 02/10/2022   Tobacco use 02/10/2022   Fatigue 02/10/2022   Preventative health care 02/10/2022   Chronic bilateral low back pain with sciatica 10/14/2021   Abdominal distension 10/14/2021   Breast tenderness 10/14/2021   Intertrigo 10/14/2021   Chronic hepatitis C without hepatic coma (HCC) 09/17/2021   IVDU (intravenous drug user) 09/17/2021   Lower extremity edema 09/17/2021   Hepatitis C antibody positive in blood 06/23/2021   Localized edema  06/23/2021   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/27/2020   Mental health disorder    Acute encephalopathy 06/25/2020   Substance use disorder 06/25/2020   Chronic pain syndrome 06/25/2020   Benzodiazepine withdrawal without complication (HCC)    Seizures (HCC) 06/24/2020   H/O: substance abuse (HCC) 07/28/2012   GAD (generalized anxiety disorder) 07/28/2012    PCP: Billie Lade, MD  REFERRING PROVIDER: Darreld Mclean, MD   REFERRING DIAG: M54.42,M54.41,G89.29 (ICD-10-CM) - Chronic bilateral low back pain with bilateral sciatica   Rationale for Evaluation and Treatment Rehabilitation  THERAPY DIAG:  Chronic bilateral low back pain with bilateral sciatica  Low back pain, unspecified back pain laterality, unspecified chronicity, unspecified whether sciatica present  ONSET DATE: 2020  SUBJECTIVE:  SUBJECTIVE STATEMENT: Reports MRI was cancelled due to insurance reasons. Pt states pain is more on the right side of the back today. Reports mattress change helped somewhat with sleeping.  Eval:Has had back trouble for years; in 2020 fell off ladder and landed on tailbone; woke up next morning right leg was numb. Saw Keeling; x-ray degenerative changes; no surgical history. Pain is still in lower back and then up and down back more on right side. Disturbs sleep; increased pain with standing more than 5 minutes.1/10 to  9/10 at most  PERTINENT HISTORY:  Waiting on disability Low testosterone Walked to therapy lives less than a mile away  PAIN:  Are you having pain? Yes: NPRS scale: 6/10 Pain location: low back right side more than left Pain description: aching, stabbing Aggravating factors: prolonged standing more than 5 minutes Relieving factors: sitting in chair with good posture and  support.   PRECAUTIONS: None  WEIGHT BEARING RESTRICTIONS No  FALLS:  Has patient fallen in last 6 months? No  LIVING ENVIRONMENT: Lives with: lives with their family Lives in: House/apartment Stairs: Yes: External: 2 steps; on right going up, on left going up, and can reach both Has following equipment at home: None  OCCUPATION: disability  PLOF: Independent  PATIENT GOALS get my back feeling better   OBJECTIVE:   DIAGNOSTIC FINDINGS:  Trying to get an MRI ordered  PATIENT SURVEYS:  FOTO 39  COGNITION:  Overall cognitive status: Within functional limits for tasks assessed     SENSATION: WFL   POSTURE: rounded shoulders; guarded posture  PALPATION: Tender lumbar paraspinals from T12 down to S1; muscle tightness noted thoracic spine  LUMBAR ROM:   Active  AROM  eval  Flexion 80% available  Extension 30% available **  Right lateral flexion 40% available**  Left lateral flexion 40% available**  Right rotation   Left rotation    (Blank rows = not tested)   LOWER EXTREMITY MMT:    MMT Right eval Left eval  Hip flexion 4-/5 4+/5  Hip extension 3+ 3+  Hip abduction    Hip adduction    Hip internal rotation    Hip external rotation    Knee flexion 3 3-  Knee extension 5 5  Ankle dorsiflexion 5 5  Ankle plantarflexion    Ankle inversion    Ankle eversion     (Blank rows = not tested)  FUNCTIONAL TESTS:  5 times sit to stand: 15.52 sec increased pain 2 minute walk test: not tested    TODAY'S TREATMENT  03/09/22 Prone lying x5' with moist heat Glute sets with heel squeeze 5" hold x10  Prone on elbows x10   Supine with emphasis on abdominal bracing:  SLR 2x10  Bridge 2x10  Isometric hip adduction 2x10  Isometric hip abduction 2x10  Ball press 2x10  4# DB overhead shoulder flexion 2x10   Sidelying clamshell 2x10    Standing RTB shoulder extension 2x10   03/03/22 Review of HEP and goals Prone: Prone lying x 5' with moist  heat Glute sets 5" hold x 10  Supine: LTR x 15 Abdominal bracing 5" x 10 Abdominal bracing with march 2 x 10 Abdominal bracing with hip adduction with ball 5" hold x 10 Abdominal bracing with hip abduction with RTB 2 x 10 Bridge x 5     Physical therapy evaluation and HEP.   PATIENT EDUCATION:  Education details: Patient educated on exam findings, POC, scope of PT, HEP, and good sitting posture. Person educated:  Patient Education method: Explanation, Demonstration, and Handouts Education comprehension: verbalized understanding, returned demonstration, verbal cues required, and tactile cues required   HOME EXERCISE PROGRAM: Access Code: [email or web address] URL: [email or web address] Date: 03/03/2022 Prepared by: AP - Rehab  Exercises - Lying Prone  - 3 x daily - 7 x weekly - 3 sets - 10 reps - 5-10 min hold - Supine Lower Trunk Rotation  - 3 x daily - 7 x weekly - 1 sets - 10 reps - 2-3"hold hold - Supine Gluteal Sets  - 3 x daily - 7 x weekly - 1 sets - 10 reps - 5" hold - Correct Seated Posture  - 1 x daily - 7 x weekly - 1 sets - 1 reps - Supine Transversus Abdominis Bracing - Hands on Stomach  - 3 x daily - 7 x weekly - 1 sets - 10 reps - 5" hold - Supine March  - 3 x daily - 7 x weekly - 2 sets - 10 reps - Supine Hip Adduction Isometric with Ball  - 3 x daily - 7 x weekly - 1 sets - 10 reps - Hooklying Clamshell with Resistance  - 3 x daily - 7 x weekly - 2 sets - 10 reps  ASSESSMENT:  CLINICAL IMPRESSION: Progressed core exercises and lumbar ROM. Education for symptom awareness and avoidance of peripheralizing activities and postures. Pt verbalized understanding. Able to participate with all exercises, no increase in radicular symptoms. Patient will continue to benefit from skilled physical therapy services to further improve independence with functional mobility.   OBJECTIVE IMPAIRMENTS decreased activity tolerance, decreased endurance, decreased knowledge of  condition, decreased mobility, decreased ROM, decreased strength, hypomobility, increased fascial restrictions, impaired perceived functional ability, increased muscle spasms, impaired flexibility, postural dysfunction, and pain.   ACTIVITY LIMITATIONS carrying, lifting, bending, sitting, standing, squatting, sleeping, stairs, transfers, reach over head, hygiene/grooming, locomotion level, and caring for others  PARTICIPATION LIMITATIONS: meal prep, cleaning, laundry, community activity, occupation, and yard work   [facility] POTENTIAL: Good  CLINICAL DECISION MAKING: Stable/uncomplicated  EVALUATION COMPLEXITY: Low   GOALS: Goals reviewed with patient? Yes  SHORT TERM GOALS: Target date: 03/19/2022   patient will be independent with initial HEP  Baseline: Goal status: IN PROGRESS  2.  Patient will improve 5 x STS score from 15.52 sec to 14 sec to demonstrate improved functional mobility and increased lower extremity strength.  Baseline:  Goal status: IN PROGRESS  LONG TERM GOALS: Target date: 04/09/2022  Patient will be independent in self management strategies to improve quality of life and functional outcomes.  Baseline:  Goal status: IN PROGRESS  2.  Patient will improve FOTO score to predicted value to demonstrate improved functional mobility  Baseline: 39 Goal status: IN PROGRESS  3.  Patient will report at least 50% improvement in overall symptoms and/or function to demonstrate improved functional mobility  Baseline:  Goal status: IN PROGRESS  4.  Patient will improve 5 x STS score from 15.54 sec to 12 sec to demonstrate improved functional mobility and increased lower extremity strength.  Baseline:  Goal status: IN PROGRESS  5.   Patient will increase tested lower extremity MMT's to 5/5 to promote return to ambulation community distances with minimal deviation.  Baseline: see above Goal status: IN PROGRESS   PLAN: PT FREQUENCY: 1x/week  PT DURATION: 6  weeks  PLANNED INTERVENTIONS: Therapeutic exercises, Therapeutic activity, Neuromuscular re-education, Balance training, Gait training, Patient/Family education, Joint manipulation, Joint mobilization, Stair training, Orthotic/Fit training, DME instructions, Aquatic Therapy, Dry  Needling, Electrical stimulation, Spinal manipulation, Spinal mobilization, Cryotherapy, Moist heat, Compression bandaging, scar mobilization, Splintting, Taping, Traction, Ultrasound, Ionotophoresis 4mg /ml Dexamethasone, and Manual therapy   PLAN FOR NEXT SESSION:  lumbar mobility and core strengthening; increase volume/load and progress into seated and standing positions as able.    8:53 AM, 03/09/22 Candie [place], PT, DPT Physical Therapist Acute [facility] Services [facility]

## 2022-03-10 ENCOUNTER — Encounter: Payer: Self-pay | Admitting: Orthopaedic Surgery

## 2022-03-11 ENCOUNTER — Ambulatory Visit (HOSPITAL_COMMUNITY)
Admission: RE | Admit: 2022-03-11 | Discharge: 2022-03-11 | Disposition: A | Discharge status: Home / Self Care | Payer: 59 | Source: Ambulatory Visit | Attending: Infectious Disease | Admitting: Infectious Disease

## 2022-03-11 DIAGNOSIS — M5441 Lumbago with sciatica, right side: Secondary | ICD-10-CM | POA: Diagnosis not present

## 2022-03-11 DIAGNOSIS — R69 Illness, unspecified: Secondary | ICD-10-CM | POA: Diagnosis not present

## 2022-03-11 DIAGNOSIS — G8929 Other chronic pain: Secondary | ICD-10-CM | POA: Diagnosis not present

## 2022-03-11 DIAGNOSIS — M5442 Lumbago with sciatica, left side: Secondary | ICD-10-CM | POA: Diagnosis not present

## 2022-03-11 DIAGNOSIS — F1990 Other psychoactive substance use, unspecified, uncomplicated: Secondary | ICD-10-CM | POA: Insufficient documentation

## 2022-03-11 DIAGNOSIS — M4126 Other idiopathic scoliosis, lumbar region: Secondary | ICD-10-CM | POA: Diagnosis not present

## 2022-03-11 DIAGNOSIS — M545 Low back pain, unspecified: Secondary | ICD-10-CM | POA: Diagnosis not present

## 2022-03-12 ENCOUNTER — Telehealth: Payer: Self-pay

## 2022-03-12 DIAGNOSIS — R69 Illness, unspecified: Secondary | ICD-10-CM | POA: Diagnosis not present

## 2022-03-12 DIAGNOSIS — F4312 Post-traumatic stress disorder, chronic: Secondary | ICD-10-CM | POA: Diagnosis not present

## 2022-03-12 DIAGNOSIS — F319 Bipolar disorder, unspecified: Secondary | ICD-10-CM | POA: Diagnosis not present

## 2022-03-12 DIAGNOSIS — F411 Generalized anxiety disorder: Secondary | ICD-10-CM | POA: Diagnosis not present

## 2022-03-12 NOTE — Telephone Encounter (Signed)
Called patient, relayed that MRI showed no infection in spine and recommended he follow up with his orthopedic doctor. Patient verbalized understanding and has no further questions.   Beryle Flock, RN

## 2022-03-12 NOTE — Telephone Encounter (Signed)
-----   Message from Truman Hayward, MD sent at 03/12/2022 11:08 AM EDT ----- Regarding: FW: No infection in spine. He can follow up w other MD Re findings on scan ----- Message ----- From: Interface, Rad Results In Sent: 03/11/2022   8:07 PM EDT To: Truman Hayward, MD

## 2022-03-15 ENCOUNTER — Ambulatory Visit (INDEPENDENT_AMBULATORY_CARE_PROVIDER_SITE_OTHER): Payer: 59 | Admitting: Internal Medicine

## 2022-03-15 ENCOUNTER — Encounter: Payer: Self-pay | Admitting: Internal Medicine

## 2022-03-15 VITALS — BP 138/88 | HR 85 | Ht 69.0 in | Wt 186.2 lb

## 2022-03-15 DIAGNOSIS — Z2821 Immunization not carried out because of patient refusal: Secondary | ICD-10-CM | POA: Diagnosis not present

## 2022-03-15 DIAGNOSIS — F411 Generalized anxiety disorder: Secondary | ICD-10-CM | POA: Diagnosis not present

## 2022-03-15 DIAGNOSIS — E291 Testicular hypofunction: Secondary | ICD-10-CM | POA: Diagnosis not present

## 2022-03-15 DIAGNOSIS — F99 Mental disorder, not otherwise specified: Secondary | ICD-10-CM | POA: Diagnosis not present

## 2022-03-15 DIAGNOSIS — F1911 Other psychoactive substance abuse, in remission: Secondary | ICD-10-CM

## 2022-03-15 DIAGNOSIS — R69 Illness, unspecified: Secondary | ICD-10-CM | POA: Diagnosis not present

## 2022-03-15 MED ORDER — TESTOSTERONE 25 MG/2.5GM (1%) TD GEL: 50.0000 mg | Freq: Every day | TRANSDERMAL | 1 refills | Status: DC

## 2022-03-15 NOTE — Assessment & Plan Note (Signed)
Currently followed by a psychiatrist with [facility] in [place], [place].  He is prescribed Klonopin, gabapentin, and propranolol for anxiety relief.  His psychiatrist also manages Suboxone and Concerta prescriptions.  Today he states that he is not pleased with his psychiatrist because he does not feel like he is listened to when describing poorly controlled anxiety.  He is interested in either having me take over his prescriptions or establishing care with a different psychiatrist. -I have placed a new psychiatry referral today at his request.

## 2022-03-15 NOTE — Patient Instructions (Signed)
It was a pleasure to see you today.  Thank you for giving [place] the opportunity to be involved in your care.  Below is a brief recap of your visit and next steps.  We will plan to see you again in 1 month  Summary I have prescribed Androgel today for treatment of low testosterone level. We will also check labs. I have placed a referral to psychiatry in [place] to establish care.  Next steps Follow up in 1 month I will notify you of lab results

## 2022-03-15 NOTE — Progress Notes (Signed)
Established Patient Office Visit  Subjective   Patient ID: Andre Lynch, male    DOB: 1980-06-08  Age: 41 y.o. MRN: [id number]  Chief Complaint  Patient presents with   Follow-up   Mr. Andre Lynch returns to care today.  He is a 41 year old male with a past medical history significant for tobacco use, previous substance use, ADHD, anxiety, and HCV (treated).  He was last seen by me to establish care on 9/12.  At that time he endorsed fatigue and chronic low back pain.  Testosterone levels were checked at his request and were subsequently low.  He has since had repeat levels drawn as well as FSH/LH levels checked.  He returns today to discuss results and neck steps.  In the interim, Mr. Andre Lynch has been seen by orthopedic surgery and ID in follow-up.  He has had an MRI of his lumbar spine completed as well.  Today [name] states that he feels fairly well.  He continues to endorse fatigue.  He is also not satisfied with his psychiatrist at [facility].  He is interested in either having me take over his prescriptions or being referred to a different psychiatrist.  He is otherwise asymptomatic and without concerns.  Past Medical History:  Diagnosis Date   Abdominal distension 10/14/2021   Anxiety    Back pain    Breast tenderness 10/14/2021   Chronic hepatitis C without hepatic coma ([place]) 09/17/2021   Chronic knee pain    Depression    Gastric ulcer    Hepatitis-C    History of narcotic addiction ([place])    methadone tx   Intertrigo 10/14/2021   IVDU (intravenous drug user) 09/17/2021   Kidney stones    Low testosterone 03/02/2022   Lower extremity edema 09/17/2021   Lumbago 10/14/2021   RLS (restless legs syndrome)    Seizures ([place])    last one early 2018   Substance abuse (HCC)    cocaine, benzos, marijuana, benzos, opiates   Past Surgical History:  Procedure Laterality Date   HERNIA REPAIR     WISDOM TOOTH EXTRACTION     Social History   Tobacco Use    Smoking status: Every Day    Packs/day: 0.50    Years: 20.00    Total pack years: 10.00    Types: Cigarettes   Smokeless tobacco: Never  Vaping Use   Vaping Use: Some days  Substance Use Topics   Alcohol use: Not Currently   Drug use: Not Currently   Family History  Problem Relation Age of Onset   Cancer Mother        breast cancer survivor   Allergies  Allergen Reactions   Ibuprofen Other (See Comments)    Cannot take due to stomach ulcers   Tylenol [Acetaminophen] Other (See Comments)    Cannot take due to stomach ulcers   Review of Systems  Constitutional:  Positive for malaise/fatigue.  Psychiatric/Behavioral:  The patient is nervous/anxious.   All other systems reviewed and are negative.    Objective:     BP 138/88   Pulse 85   Ht 5' 9"  (1.753 m)   Wt 186 lb 3.2 oz (84.5 kg)   SpO2 95%   BMI 27.50 kg/m   Physical Exam Vitals reviewed.  Constitutional:      General: He is not in acute distress.    Appearance: Normal appearance. He is not ill-appearing.  HENT:     Head: Normocephalic and atraumatic.  Nose: Nose normal. No congestion or rhinorrhea.     Mouth/Throat:     Mouth: Mucous membranes are moist.     Pharynx: Oropharynx is clear.  Eyes:     Extraocular Movements: Extraocular movements intact.     Conjunctiva/sclera: Conjunctivae normal.     Pupils: Pupils are equal, round, and reactive to light.  Cardiovascular:     Rate and Rhythm: Normal rate and regular rhythm.     Pulses: Normal pulses.     Heart sounds: Normal heart sounds. No murmur heard. Pulmonary:     Effort: Pulmonary effort is normal.     Breath sounds: Normal breath sounds. No wheezing, rhonchi or rales.  Abdominal:     General: Abdomen is flat. Bowel sounds are normal. There is no distension.     Palpations: Abdomen is soft.     Tenderness: There is no abdominal tenderness.  Musculoskeletal:        General: No swelling or deformity. Normal range of motion.     Cervical  back: Normal range of motion.  Skin:    General: Skin is warm and dry.     Capillary Refill: Capillary refill takes less than 2 seconds.  Neurological:     General: No focal deficit present.     Mental Status: He is alert and oriented to person, place, and time.     Motor: No weakness.  Psychiatric:        Mood and Affect: Mood normal.        Behavior: Behavior normal.        Thought Content: Thought content normal.    Last CBC Lab Results  Component Value Date   WBC 5.3 03/02/2022   HGB 15.1 03/02/2022   HCT 45.0 03/02/2022   MCV 97.6 03/02/2022   MCH 32.8 03/02/2022   RDW 11.9 03/02/2022   PLT 283 90/21/1155   Last metabolic panel Lab Results  Component Value Date   GLUCOSE 87 03/02/2022   NA 139 03/02/2022   K 4.0 03/02/2022   CL 105 03/02/2022   CO2 26 03/02/2022   BUN 11 03/02/2022   CREATININE 0.93 03/02/2022   EGFR 105 03/02/2022   CALCIUM 9.4 03/02/2022   PHOS 3.2 06/25/2020   PROT 6.7 03/02/2022   ALBUMIN 4.4 02/09/2022   LABGLOB 2.4 02/09/2022   AGRATIO 1.8 02/09/2022   BILITOT 0.3 03/02/2022   ALKPHOS 62 02/09/2022   AST 13 03/02/2022   ALT 9 03/02/2022   ANIONGAP 9 06/23/2021   Last lipids Lab Results  Component Value Date   CHOL 175 02/09/2022   HDL 52 02/09/2022   LDLCALC 103 (H) 02/09/2022   TRIG 114 02/09/2022   CHOLHDL 3.4 02/09/2022   Last hemoglobin A1c Lab Results  Component Value Date   HGBA1C 5.7 (H) 02/09/2022   Last thyroid functions Lab Results  Component Value Date   TSH 1.690 06/24/2020   The 10-year ASCVD risk score (Arnett DK, et al., 2019) is: 3.7%    Assessment & Plan:   Problem List Items Addressed This Visit       Secondary male hypogonadism    Recently endorsing fatigue.  He has multiple low 8 AM testosterone levels.  FSH/LH levels are normal, suggesting secondary hypogonadism.  He is interested in starting testosterone therapy. -I have prescribed AndroGel today.  We reviewed appropriate application and  potential side effects.  PDMP reviewed. -Checking TSH/T4, prolactin, iron studies, and AM cortisol level.  Further work-up pending results. -He will follow-up  in 1 month      GAD (generalized anxiety disorder) (Chronic)    Currently followed by a psychiatrist with [facility] in [place], [place].  He is prescribed Klonopin, gabapentin, and propranolol for anxiety relief.  His psychiatrist also manages Suboxone and Concerta prescriptions.  Today he states that he is not pleased with his psychiatrist because he does not feel like he is listened to when describing poorly controlled anxiety.  He is interested in either having me take over his prescriptions or establishing care with a different psychiatrist. -I have placed a new psychiatry referral today at his request.      Return in about 1 month (around 04/15/2022).    Johnette Abraham, MD

## 2022-03-15 NOTE — Assessment & Plan Note (Addendum)
Recently endorsing fatigue.  He has multiple low 8 AM testosterone levels.  FSH/LH levels are normal, suggesting secondary hypogonadism.  He is interested in starting testosterone therapy. -I have prescribed AndroGel today.  We reviewed appropriate application and potential side effects.  PDMP reviewed. -Checking TSH/T4, prolactin, iron studies, and AM cortisol level.  Further work-up pending results. -He will follow-up in 1 month

## 2022-03-16 LAB — TSH+FREE T4
Free T4: 1.29 ng/dL (ref 0.82–1.77)
TSH: 1.28 u[IU]/mL (ref 0.450–4.500)

## 2022-03-16 LAB — IRON,TIBC AND FERRITIN PANEL
Ferritin: 48 ng/mL (ref 30–400)
Iron Saturation: 15 % (ref 15–55)
Iron: 50 ug/dL (ref 38–169)
Total Iron Binding Capacity: 327 ug/dL (ref 250–450)
UIBC: 277 ug/dL (ref 111–343)

## 2022-03-16 LAB — PROLACTIN: Prolactin: 3.8 ng/mL — ABNORMAL LOW (ref 4.0–15.2)

## 2022-03-16 LAB — CORTISOL: Cortisol: 6.8 ug/dL (ref 6.2–19.4)

## 2022-03-17 ENCOUNTER — Encounter: Payer: Self-pay | Admitting: Internal Medicine

## 2022-03-17 ENCOUNTER — Encounter (HOSPITAL_COMMUNITY): Payer: 59

## 2022-03-18 ENCOUNTER — Ambulatory Visit: Payer: 59 | Admitting: Orthopaedic Surgery

## 2022-03-18 ENCOUNTER — Telehealth: Payer: Self-pay

## 2022-03-18 ENCOUNTER — Encounter: Payer: Self-pay | Admitting: Orthopaedic Surgery

## 2022-03-18 VITALS — BP 142/98 | HR 96 | Ht 69.0 in | Wt 186.0 lb

## 2022-03-18 DIAGNOSIS — M5441 Lumbago with sciatica, right side: Secondary | ICD-10-CM

## 2022-03-18 DIAGNOSIS — M5442 Lumbago with sciatica, left side: Secondary | ICD-10-CM

## 2022-03-18 DIAGNOSIS — G894 Chronic pain syndrome: Secondary | ICD-10-CM | POA: Diagnosis not present

## 2022-03-18 DIAGNOSIS — G8929 Other chronic pain: Secondary | ICD-10-CM

## 2022-03-18 NOTE — Patient Instructions (Signed)
If you get into Neurosurgery and they treat you, you cancel your appt with Dr.Keeling  You have been referred to [facility] on [facility] in [place]. They will call you to schedule the appointment. If you have not heard anything in one week call them to schedule [phone]. This referral has to be uploaded to another system because they are not on the same medical records system as we are. Please allow [place] two business days to get this uploaded for you.

## 2022-03-18 NOTE — Telephone Encounter (Signed)
Patient came by the office to see why the medicine was denied needs prior authorization.  [employer] prior authorization phone # [phone]  Testosterone (ANDROGEL) 25 MG/2.5GM (1%) GEL [[id number]]

## 2022-03-18 NOTE — Progress Notes (Signed)
My back is worse.  He had a MRI of the lumbar spine ordered by his family doctor.  It showed: IMPRESSION: 1. L5-S1 left subarticular and foraminal disc protrusion, which may contact the descending left S1 nerve roots. 2. No spinal canal stenosis or neural foraminal narrowing. 3. Levoscoliosis of the lumbar spine.  I have explained the findings.  He has been to PT with no help.  I will have him seen by neurosurgery.  Spine/Pelvis examination:  Inspection:  Overall, sacoiliac joint benign and hips nontender; without crepitus or defects.   Thoracic spine inspection: Alignment normal without kyphosis present   Lumbar spine inspection:  Alignment  with normal lumbar lordosis, without scoliosis apparent.   Thoracic spine palpation:  without tenderness of spinal processes   Lumbar spine palpation: without tenderness of lumbar area; without tightness of lumbar muscles    Range of Motion:   Lumbar flexion, forward flexion is normal without pain or tenderness    Lumbar extension is full without pain or tenderness   Left lateral bend is normal without pain or tenderness   Right lateral bend is normal without pain or tenderness   Straight leg raising is normal  Strength & tone: normal   Stability overall normal stability  Encounter Diagnoses  Name Primary?   Chronic bilateral low back pain with bilateral sciatica Yes   Chronic pain syndrome    To neurosurgery.  He is on pain clinic medicine and I will let them continue this.  I will not give any narcotics.  Call if any problem.  Precautions discussed.  RTC one month.  He may cancel as needed.  Electronically Signed Sanjuana Kava, MD 10/19/202312:03 PM

## 2022-03-19 ENCOUNTER — Telehealth (HOSPITAL_COMMUNITY): Payer: 59 | Admitting: Psychiatry

## 2022-03-19 ENCOUNTER — Other Ambulatory Visit: Payer: Self-pay | Admitting: Internal Medicine

## 2022-03-19 ENCOUNTER — Encounter (HOSPITAL_COMMUNITY): Payer: Self-pay

## 2022-03-19 DIAGNOSIS — E291 Testicular hypofunction: Secondary | ICD-10-CM

## 2022-03-19 MED ORDER — "BD SAFETYGLIDE SHIELDED NEEDLE 21G X 1-1/2"" MISC": 1.0000 | 0 refills | Status: DC

## 2022-03-19 MED ORDER — TESTOSTERONE CYPIONATE 200 MG/ML IM SOLN: 200.0000 mg | INTRAMUSCULAR | 0 refills | Status: DC

## 2022-03-19 MED ORDER — "BD SYRINGE/NEEDLE 23G X 1"" 3 ML MISC": 1.0000 | 0 refills | Status: DC

## 2022-03-22 ENCOUNTER — Encounter: Payer: Self-pay | Admitting: Orthopaedic Surgery

## 2022-03-24 ENCOUNTER — Encounter (HOSPITAL_COMMUNITY): Payer: 59

## 2022-03-26 ENCOUNTER — Emergency Department (HOSPITAL_COMMUNITY)
Admission: EM | Admit: 2022-03-26 | Discharge: 2022-03-26 | Discharge status: Against Medical Advice | Payer: 59 | Attending: Emergency Medicine | Admitting: Emergency Medicine

## 2022-03-26 ENCOUNTER — Encounter (HOSPITAL_COMMUNITY): Payer: Self-pay | Admitting: Emergency Medicine

## 2022-03-26 ENCOUNTER — Other Ambulatory Visit: Payer: Self-pay

## 2022-03-26 ENCOUNTER — Emergency Department (HOSPITAL_COMMUNITY): Payer: 59

## 2022-03-26 DIAGNOSIS — F419 Anxiety disorder, unspecified: Secondary | ICD-10-CM | POA: Insufficient documentation

## 2022-03-26 DIAGNOSIS — Z79899 Other long term (current) drug therapy: Secondary | ICD-10-CM | POA: Diagnosis not present

## 2022-03-26 DIAGNOSIS — F1124 Opioid dependence with opioid-induced mood disorder: Secondary | ICD-10-CM | POA: Diagnosis not present

## 2022-03-26 DIAGNOSIS — R69 Illness, unspecified: Secondary | ICD-10-CM | POA: Diagnosis not present

## 2022-03-26 NOTE — ED Notes (Signed)
Pt not in treatment room.

## 2022-03-26 NOTE — ED Provider Notes (Signed)
[facility] EMERGENCY DEPARTMENT Provider Note   CSN: [id number] Arrival date & time: 03/26/22  1133     History {Add pertinent medical, surgical, social history, OB history to HPI:1} Chief Complaint  Patient presents with   Anxiety    Andre Lynch is a 41 y.o. male with a past medical history of anxiety substance abuse presenting to the emergency department for evaluation of anxiety.  Patient states that he has had increased anxiety in the last 3 days with associated chest pain, shortness of breath and nausea without vomiting..  Denies having any stressors recently.  He described the chest pain as sharp, constant, nonradiating, located on the left chest.  Denies any weakness, diaphoresis.  Patient was seen by a psychiatrist this morning who sent him a prescription however patient got another attack on the way to pick up his medication prompted him to go to the emergency department for evaluation.  Patient has been taking Klonopin half milligram tablet twice a day with no relief in the last 3 to 4 days. Denies any thoughts of hurting himself or others.   HPI     Home Medications Prior to Admission medications   Medication Sig Start Date End Date Taking? Authorizing Provider  buprenorphine-naloxone (SUBOXONE) 8-2 mg SUBL SL tablet Place 1 tablet under the tongue 2 (two) times daily. 02/28/22   [provider]  CAPLYTA 42 MG capsule SMARTSIG:1 Capsule(s) By Mouth Every Evening 01/10/22   [provider]  clonazePAM (KLONOPIN) 0.5 MG tablet Take 0.5 mg by mouth 2 (two) times daily as needed. 03/15/22   [provider]  clonazePAM (KLONOPIN) 1 MG tablet Take 1 mg by mouth 3 (three) times daily as needed. 05/17/20   [provider]  gabapentin (NEURONTIN) 300 MG capsule Take by mouth. 03/12/22   [provider]  gabapentin (NEURONTIN) 600 MG tablet Take 600 mg by mouth 3 (three) times daily.    [provider]  methocarbamol (ROBAXIN)  500 MG tablet Take 1 tablet (500 mg total) by mouth 3 (three) times daily. 01/28/22   Sanjuana Kava, MD  methylphenidate 54 MG PO CR tablet Take 54 mg by mouth every morning. 01/30/22   [provider]  NEEDLE, DISP, 21 G (BD SAFETYGLIDE SHIELDED NEEDLE) 21G X 1-1/2" MISC 1 Needle by Does not apply route every 14 (fourteen) days. 03/19/22   Johnette Abraham, MD  SYRINGE-NEEDLE, DISP, 3 ML (B-D SYRINGE/NEEDLE 3CC/23GX1") 23G X 1" 3 ML MISC 1 each by Does not apply route every 14 (fourteen) days. 03/19/22   Johnette Abraham, MD  testosterone cypionate (DEPOTESTOSTERONE CYPIONATE) 200 MG/ML injection Inject 1 mL (200 mg total) into the muscle every 14 (fourteen) days. 03/19/22   Johnette Abraham, MD      Allergies    Ibuprofen and Tylenol [acetaminophen]    Review of Systems   Review of Systems  Psychiatric/Behavioral:  The patient is nervous/anxious.     Physical Exam Updated Vital Signs BP 125/81 (BP Location: Right Arm)   Pulse 92   Temp 98.2 F (36.8 C) (Oral)   Resp 17   SpO2 93%  Physical Exam Vitals and nursing note reviewed.  Constitutional:      Appearance: Normal appearance.  HENT:     Head: Normocephalic and atraumatic.     Mouth/Throat:     Mouth: Mucous membranes are moist.  Eyes:     General: No scleral icterus. Cardiovascular:     Rate and Rhythm: Normal rate and  regular rhythm.     Pulses: Normal pulses.     Heart sounds: Normal heart sounds.  Pulmonary:     Effort: Pulmonary effort is normal.     Breath sounds: Normal breath sounds.  Abdominal:     General: Abdomen is flat.     Palpations: Abdomen is soft.     Tenderness: There is no abdominal tenderness.  Musculoskeletal:        General: No deformity.  Skin:    General: Skin is warm.     Findings: No rash.  Neurological:     General: No focal deficit present.     Mental Status: He is alert.  Psychiatric:        Mood and Affect: Mood normal.     Comments: Patient appears anxious.  Alert and  oriented x4.  No SI/HI.     ED Results / Procedures / Treatments   Labs (all labs ordered are listed, but only abnormal results are displayed) Labs Reviewed  BASIC METABOLIC PANEL  CBC  TROPONIN I (HIGH SENSITIVITY)    EKG None  Radiology No results found.  Procedures Procedures  {Document cardiac monitor, telemetry assessment procedure when appropriate:1}  Medications Ordered in ED Medications - No data to display  ED Course/ Medical Decision Making/ A&P                           Medical Decision Making Amount and/or Complexity of Data Reviewed Labs: ordered. Radiology: ordered.   This patient presents to the ED for concern of anxiety and chest pain, this involves an extensive number of treatment options, and is a complaint that carries with it a high risk of complications and morbidity.  The differential diagnosis includes panic attack, ACS, substance abuse. Co morbidities that complicate the patient evaluation  See HPI Additional history obtained:  Additional history obtained from EMR Lab Tests:  I Ordered, and personally interpreted labs including troponin, CBC, BMP Imaging Studies ordered:  I ordered imaging studies including: Chest x-ray pending Cardiac Monitoring: / EKG:  The patient was maintained on a cardiac monitor.  I personally viewed and interpreted the cardiac monitored which showed an underlying rhythm of: sinus rhythm Consultations Obtained:  na Problem List / ED Course / Critical interventions / Medication management  Vitals signs within normal range and stable throughout visit Laboratory/imaging studies significant for: See above On physical examination, patient is afebrile and appears in no acute distress.  He does appear conscious.  Denies thought of harming himself or or others.  Patient has been taking Klonopin for anxiety.  States this has not worked in the last few days.  I ordered chest pain work-up including troponin, chest x-ray,  EKG.  Patient eloped. Patient's presentations are most concerned for panic attack. Low suspicion for ACS. I have reviewed the patients home medicines and have made adjustments as needed Treatment plan discussed with patient.  Pt acknowledged understanding was agreeable to the plan. Social Determinants of Health:  N/A Test / Admission / Dispo - Considered:  Patient eloped after being seen by me.  {Document critical care time when appropriate:1} {Document review of labs and clinical decision tools ie heart score, Chads2Vasc2 etc:1}  {Document your independent review of radiology images, and any outside records:1} {Document your discussion with family members, caretakers, and with consultants:1} {Document social determinants of health affecting pt's care:1} {Document your decision making why or why not admission, treatments were needed:1} Final Clinical Impression(s) /  ED Diagnoses Final diagnoses:  None    Rx / DC Orders ED Discharge Orders     None

## 2022-03-26 NOTE — ED Triage Notes (Signed)
Pt presents with "panic attacks"  Hx of same. States he normally takes 0.5 of klonopin twice a day but it hasn't worked the past 2 days. "Can't seem to get control of it this time"

## 2022-03-26 NOTE — ED Notes (Signed)
Unable to locate patient.

## 2022-03-26 NOTE — ED Notes (Signed)
Notified PA, pt not in room.

## 2022-03-30 DIAGNOSIS — M5440 Lumbago with sciatica, unspecified side: Secondary | ICD-10-CM | POA: Diagnosis not present

## 2022-03-31 ENCOUNTER — Telehealth (HOSPITAL_COMMUNITY): Payer: Self-pay

## 2022-03-31 ENCOUNTER — Encounter (HOSPITAL_COMMUNITY): Payer: Self-pay

## 2022-03-31 ENCOUNTER — Ambulatory Visit (HOSPITAL_COMMUNITY): Payer: 59 | Attending: Orthopaedic Surgery

## 2022-03-31 DIAGNOSIS — M545 Low back pain, unspecified: Secondary | ICD-10-CM | POA: Insufficient documentation

## 2022-03-31 DIAGNOSIS — M5441 Lumbago with sciatica, right side: Secondary | ICD-10-CM | POA: Insufficient documentation

## 2022-03-31 DIAGNOSIS — G8929 Other chronic pain: Secondary | ICD-10-CM | POA: Insufficient documentation

## 2022-03-31 DIAGNOSIS — M5442 Lumbago with sciatica, left side: Secondary | ICD-10-CM | POA: Insufficient documentation

## 2022-03-31 NOTE — Telephone Encounter (Signed)
Spoke with patient regarding missed appointment today; states he meant to call and cancel as he injured his back helping replace a hot water heater in a rental home.  Saw his neurosurgeon yesterday who wants him to continue with therapy.  He plans to keep his next appointment next week 11/6.  8:49 AM, 03/31/22 Amiliana Foutz Small Cornelis Kluver MPT [facility] [phone]

## 2022-04-05 ENCOUNTER — Encounter: Payer: Self-pay | Admitting: Internal Medicine

## 2022-04-07 ENCOUNTER — Ambulatory Visit (HOSPITAL_COMMUNITY): Payer: 59

## 2022-04-07 DIAGNOSIS — M5442 Lumbago with sciatica, left side: Secondary | ICD-10-CM | POA: Diagnosis not present

## 2022-04-07 DIAGNOSIS — G8929 Other chronic pain: Secondary | ICD-10-CM

## 2022-04-07 DIAGNOSIS — M5441 Lumbago with sciatica, right side: Secondary | ICD-10-CM | POA: Diagnosis not present

## 2022-04-07 DIAGNOSIS — M545 Low back pain, unspecified: Secondary | ICD-10-CM | POA: Diagnosis not present

## 2022-04-07 NOTE — Therapy (Signed)
OUTPATIENT PHYSICAL THERAPY THORACOLUMBAR PROGRESS NOTE Progress Note Reporting Period 02/26/22 to 04/07/22  See note below for Objective Data and Assessment of Progress/Goals.       Patient Name: JORDIN VICENCIO MRN: [id number] DOB:08/11/1980, 41 y.o., male Today's Date: 04/07/2022   PT End of Session - 04/07/22 0724     Visit Number 4    Number of Visits 12    Date for PT Re-Evaluation 05/19/22    Authorization Type [employer]; no ded; no copay, no VL, no auth required    Progress Note Due on Visit 6    PT Start Time 0724    PT Stop Time 0805    PT Time Calculation (min) 41 min    Activity Tolerance Patient limited by pain;Patient tolerated treatment well    Behavior During Therapy [facility] for tasks assessed/performed              Past Medical History:  Diagnosis Date   Abdominal distension 10/14/2021   Anxiety    Back pain    Breast tenderness 10/14/2021   Chronic hepatitis C without hepatic coma ([place]) 09/17/2021   Chronic knee pain    Depression    Gastric ulcer    Hepatitis-C    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   Intertrigo 10/14/2021   IVDU (intravenous drug user) 09/17/2021   Kidney stones    Low testosterone 03/02/2022   Lower extremity edema 09/17/2021   Lumbago 10/14/2021   RLS (restless legs syndrome)    Seizures ([place])    last one early 2018   Substance abuse ([place])    cocaine, benzos, marijuana, benzos, opiates   Past Surgical History:  Procedure Laterality Date   HERNIA REPAIR     WISDOM TOOTH EXTRACTION     Patient Active Problem List   Diagnosis Date Noted   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   ADHD 02/10/2022   Tobacco use 02/10/2022   Fatigue 02/10/2022   Preventative health care 02/10/2022   Chronic bilateral low back pain with sciatica 10/14/2021   Abdominal distension 10/14/2021   Breast tenderness 10/14/2021   Intertrigo 10/14/2021   Chronic hepatitis C without hepatic coma ([place]) 09/17/2021   IVDU  (intravenous drug user) 09/17/2021   Lower extremity edema 09/17/2021   Hepatitis C antibody positive in blood 06/23/2021   Localized edema 06/23/2021   High risk heterosexual behavior 12/08/2020   Insomnia 09/19/2020   History of manic depressive disorder 09/19/2020   History of intravenous drug abuse ([place]) 09/19/2020   Exposure to human immunodeficiency virus 09/19/2020   Anxiety 09/19/2020   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[place]) 06/27/2020   Mental health disorder    Acute encephalopathy 06/25/2020   Substance use disorder 06/25/2020   Chronic pain syndrome 06/25/2020   Benzodiazepine withdrawal without complication ([place])    Seizures ([place]) 06/24/2020   Cannabis abuse, episodic use 05/17/2020   Opioid use disorder 07/27/2018   Opioid dependence on agonist therapy ([place]) 07/27/2018   Cigarette nicotine dependence without complication 68/34/1962   H/O: substance abuse ([place]) 07/28/2012   GAD (generalized anxiety disorder) 07/28/2012    PCP: Johnette Abraham, MD  REFERRING PROVIDER: Sanjuana Kava, MD   REFERRING DIAG: M54.42,M54.41,G89.29 (ICD-10-CM) - Chronic bilateral low back pain with bilateral sciatica   Rationale for Evaluation and Treatment Rehabilitation  THERAPY DIAG:  Chronic bilateral low back pain with bilateral sciatica - Plan: PT plan of care cert/re-cert, PT plan of care cert/re-cert  Low back pain, unspecified  back pain laterality, unspecified chronicity, unspecified whether sciatica present - Plan: PT plan of care cert/re-cert, PT plan of care cert/re-cert  ONSET DATE: 5053  SUBJECTIVE:                                                                                                                                                                                           SUBJECTIVE STATEMENT: Minimal change in back pain; feels he is getting stronger as he is exercising at home; missed some time in therapy due to pulling a muscle in his chest and back trying to fix a  water heater.   Eval:Has had back trouble for years; in 2020 fell off ladder and landed on tailbone; woke up next morning right leg was numb. Saw Keeling; x-ray degenerative changes; no surgical history. Pain is still in lower back and then up and down back more on right side. Disturbs sleep; increased pain with standing more than 5 minutes.1/10 to  9/10 at most  PERTINENT HISTORY:  Waiting on disability Low testosterone Walked to therapy lives less than a mile away  PAIN:  Are you having pain? Yes: NPRS scale: 4/10 Pain location: low back right side more than left Pain description: aching, stabbing Aggravating factors: prolonged standing more than 5 minutes Relieving factors: sitting in chair with good posture and support.   PRECAUTIONS: None  WEIGHT BEARING RESTRICTIONS No  FALLS:  Has patient fallen in last 6 months? No  LIVING ENVIRONMENT: Lives with: lives with their family Lives in: House/apartment Stairs: Yes: External: 2 steps; on right going up, on left going up, and can reach both Has following equipment at home: None  OCCUPATION: disability  PLOF: Independent  PATIENT GOALS get my back feeling better   OBJECTIVE:   DIAGNOSTIC FINDINGS:  Trying to get an MRI ordered  PATIENT SURVEYS:  FOTO 39  COGNITION:  Overall cognitive status: Within functional limits for tasks assessed     SENSATION: WFL   POSTURE: rounded shoulders; guarded posture  PALPATION: Tender lumbar paraspinals from T12 down to S1; muscle tightness noted thoracic spine  LUMBAR ROM:   Active  AROM  eval AROM 04/07/22  Flexion 80% available 85% available  Extension 30% available ** 40% available**  Right lateral flexion 40% available**   Left lateral flexion 40% available**   Right rotation    Left rotation     (Blank rows = not tested)   LOWER EXTREMITY MMT:    MMT Right eval Left eval Right 04/07/22 Left 04/07/22  Hip flexion 4-/5 4+/5 4+ 5  Hip extension 3+ 3+ 4 4   Hip abduction  Hip adduction      Hip internal rotation      Hip external rotation      Knee flexion 3 3- 4 4  Knee extension _0 Ankle dorsiflexion _1 Ankle plantarflexion      Ankle inversion      Ankle eversion       (Blank rows = not tested)  FUNCTIONAL TESTS:  5 times sit to stand: 15.52 sec increased pain 2 minute walk test: not tested    TODAY'S TREATMENT  04/07/22 Progress note FOTO 57 5 times sit to stand 11.32 sec MMT's see above  Prone: Prone on elbow x 1' Prone glute sets 5" x 10  Supine: LTR x 15 Transverse abdominus contraction 5" hold x 10 Transverse abdominus contraction with march x 10 Bridge x 10 Bridge with ball squeeze for hip Adduction x 10      03/09/22 Prone lying x5' with moist heat Glute sets with heel squeeze 5" hold x10  Prone on elbows x10   Supine with emphasis on abdominal bracing:  SLR 2x10  Bridge 2x10  Isometric hip adduction 2x10  Isometric hip abduction 2x10  Ball press 2x10  4# DB overhead shoulder flexion 2x10   Sidelying clamshell 2x10    Standing RTB shoulder extension 2x10   03/03/22 Review of HEP and goals Prone: Prone lying x 5' with moist heat Glute sets 5" hold x 10  Supine: LTR x 15 Abdominal bracing 5" x 10 Abdominal bracing with march 2 x 10 Abdominal bracing with hip adduction with ball 5" hold x 10 Abdominal bracing with hip abduction with RTB 2 x 10 Bridge x 5     Physical therapy evaluation and HEP.   PATIENT EDUCATION:  Education details: Patient educated on exam findings, POC, scope of PT, HEP, and good sitting posture. Person educated: Patient Education method: Explanation, Demonstration, and Handouts Education comprehension: verbalized understanding, returned demonstration, verbal cues required, and tactile cues required   HOME EXERCISE PROGRAM: Access Code: [email or web address] URL: [email or web address] Date: 03/03/2022 Prepared by: AP -  Rehab  Exercises - Lying Prone  - 3 x daily - 7 x weekly - 3 sets - 10 reps - 5-10 min hold - Supine Lower Trunk Rotation  - 3 x daily - 7 x weekly - 1 sets - 10 reps - 2-3"hold hold - Supine Gluteal Sets  - 3 x daily - 7 x weekly - 1 sets - 10 reps - 5" hold - Correct Seated Posture  - 1 x daily - 7 x weekly - 1 sets - 1 reps - Supine Transversus Abdominis Bracing - Hands on Stomach  - 3 x daily - 7 x weekly - 1 sets - 10 reps - 5" hold - Supine March  - 3 x daily - 7 x weekly - 2 sets - 10 reps - Supine Hip Adduction Isometric with Ball  - 3 x daily - 7 x weekly - 1 sets - 10 reps - Hooklying Clamshell with Resistance  - 3 x daily - 7 x weekly - 2 sets - 10 reps  ASSESSMENT:  CLINICAL IMPRESSION: Progress note today; also received new prescription for therapy from neurosurgeon, Dr. Saintclair Halsted.  Improved score FOTO and 5 x sit to stand demonstrating improving functional mobility and increasing lower extremity strength. Continues with high pain levels in his low back with activity.  Met STG's and 2/5 LTG's.   Patient will continue to benefit from skilled physical  therapy services to further improve independence with functional mobility and to address unmet and partially met goals.     OBJECTIVE IMPAIRMENTS decreased activity tolerance, decreased endurance, decreased knowledge of condition, decreased mobility, decreased ROM, decreased strength, hypomobility, increased fascial restrictions, impaired perceived functional ability, increased muscle spasms, impaired flexibility, postural dysfunction, and pain.   ACTIVITY LIMITATIONS carrying, lifting, bending, sitting, standing, squatting, sleeping, stairs, transfers, reach over head, hygiene/grooming, locomotion level, and caring for others  PARTICIPATION LIMITATIONS: meal prep, cleaning, laundry, community activity, occupation, and yard work   [employer] POTENTIAL: Good  CLINICAL DECISION MAKING: Stable/uncomplicated  EVALUATION COMPLEXITY:  Low   GOALS: Goals reviewed with patient? Yes  SHORT TERM GOALS: Target date: 03/19/2022   patient will be independent with initial HEP  Baseline: Goal status: MET  2.  Patient will improve 5 x STS score from 15.52 sec to 14 sec to demonstrate improved functional mobility and increased lower extremity strength.  Baseline: 04/07/22 11/32 Goal status: MET  LONG TERM GOALS: Target date: 04/09/2022  Patient will be independent in self management strategies to improve quality of life and functional outcomes.  Baseline:  Goal status: IN PROGRESS  2.  Patient will improve FOTO score to predicted value to demonstrate improved functional mobility  Baseline: 39; 57 04/07/22 Goal status: MET  3.  Patient will report at least 50% improvement in overall symptoms and/or function to demonstrate improved functional mobility  Baseline:  Goal status: IN PROGRESS  4.  Patient will improve 5 x STS score from 15.54 sec to 12 sec to demonstrate improved functional mobility and increased lower extremity strength.  Baseline: 04/07/22 11.32 sec Goal status: MET  5.   Patient will increase tested lower extremity MMT's to 5/5 to promote return to ambulation community distances with minimal deviation.  Baseline: see above Goal status: IN PROGRESS   PLAN: PT FREQUENCY: 1x/week  PT DURATION: 6 weeks  PLANNED INTERVENTIONS: Therapeutic exercises, Therapeutic activity, Neuromuscular re-education, Balance training, Gait training, Patient/Family education, Joint manipulation, Joint mobilization, Stair training, Orthotic/Fit training, DME instructions, Aquatic Therapy, Dry Needling, Electrical stimulation, Spinal manipulation, Spinal mobilization, Cryotherapy, Moist heat, Compression bandaging, scar mobilization, Splintting, Taping, Traction, Ultrasound, Ionotophoresis 32m/ml Dexamethasone, and Manual therapy   PLAN FOR NEXT SESSION:  lumbar mobility and core strengthening; increase volume/load and  progress into seated and standing positions as able. Extend therapy 1 x a week x 6 weeks per Dr. CSaintclair Halsted seeing both KLuna Glasgowand CSaintclair Halstedat this time. Sees Keeling 11/19;Saintclair Halstedbeginning of December.  8:06 AM, 04/07/22 Kalyani Maeda Small Corey Caulfield MPT [facility] #[phone]

## 2022-04-08 ENCOUNTER — Encounter: Payer: Self-pay | Admitting: "Endocrinology

## 2022-04-08 ENCOUNTER — Encounter: Payer: Self-pay | Admitting: Internal Medicine

## 2022-04-08 ENCOUNTER — Ambulatory Visit (INDEPENDENT_AMBULATORY_CARE_PROVIDER_SITE_OTHER): Payer: 59 | Admitting: "Endocrinology

## 2022-04-08 VITALS — BP 118/76 | HR 92 | Ht 69.0 in | Wt 186.8 lb

## 2022-04-08 DIAGNOSIS — R69 Illness, unspecified: Secondary | ICD-10-CM | POA: Diagnosis not present

## 2022-04-08 DIAGNOSIS — E291 Testicular hypofunction: Secondary | ICD-10-CM

## 2022-04-08 DIAGNOSIS — F17200 Nicotine dependence, unspecified, uncomplicated: Secondary | ICD-10-CM | POA: Diagnosis not present

## 2022-04-08 MED ORDER — TESTOSTERONE CYPIONATE 200 MG/ML IM SOLN: 100.0000 mg | INTRAMUSCULAR | 0 refills | Status: DC

## 2022-04-08 NOTE — Progress Notes (Signed)
04/09/2022                                            Endocrinology Consult Note   Andre Lynch, 41 y.o., male   Chief Complaint  Patient presents with   Establish Care    Hypogonadism in male     Past Medical History:  Diagnosis Date   Abdominal distension 10/14/2021   Anxiety    Back pain    Breast tenderness 10/14/2021   Chronic hepatitis C without hepatic coma (HCC) 09/17/2021   Chronic knee pain    Depression    Gastric ulcer    Hepatitis-C    History of narcotic addiction (HCC)    methadone tx   Intertrigo 10/14/2021   IVDU (intravenous drug user) 09/17/2021   Kidney stones    Low testosterone 03/02/2022   Lower extremity edema 09/17/2021   Lumbago 10/14/2021   RLS (restless legs syndrome)    Seizures (HCC)    last one early 2018   Substance abuse (HCC)    cocaine, benzos, marijuana, benzos, opiates   Past Surgical History:  Procedure Laterality Date   HERNIA REPAIR     WISDOM TOOTH EXTRACTION     Social History   Socioeconomic History   Marital status: Single    Spouse name: Not on file   Number of children: Not on file   Years of education: Not on file   Highest education level: Not on file  Occupational History   Occupation: unemployed  Tobacco Use   Smoking status: Every Day    Packs/day: 0.50    Years: 20.00    Total pack years: 10.00    Types: Cigarettes   Smokeless tobacco: Never  Vaping Use   Vaping Use: Some days  Substance and Sexual Activity   Alcohol use: Not Currently   Drug use: Not Currently   Sexual activity: Not Currently    Birth control/protection: None  Other Topics Concern   Not on file  Social History Narrative   Not on file   Social Determinants of Health   Financial Resource Strain: Not on file  Food Insecurity: Not on file  Transportation Needs: Not on file  Physical Activity: Not on file  Stress: Not on file  Social Connections: Not on file   Outpatient Encounter Medications as of 04/08/2022  Medication  Sig   buprenorphine-naloxone (SUBOXONE) 8-2 mg SUBL SL tablet Place 1 tablet under the tongue 2 (two) times daily.   CAPLYTA 42 MG capsule SMARTSIG:1 Capsule(s) By Mouth Every Evening   clonazePAM (KLONOPIN) 0.5 MG tablet Take 0.5 mg by mouth 2 (two) times daily as needed.   gabapentin (NEURONTIN) 300 MG capsule Take by mouth 2 (two) times daily.   methocarbamol (ROBAXIN) 500 MG tablet Take 1 tablet (500 mg total) by mouth 3 (three) times daily.   methylphenidate 54 MG PO CR tablet Take 54 mg by mouth every morning.   NEEDLE, DISP, 21 G (BD SAFETYGLIDE SHIELDED NEEDLE) 21G X 1-1/2" MISC 1 Needle by Does not apply route every 14 (fourteen) days.   SYRINGE-NEEDLE, DISP, 3 ML (B-D SYRINGE/NEEDLE 3CC/23GX1") 23G X 1" 3 ML MISC 1 each by Does not apply route every 14 (fourteen) days.   testosterone cypionate (DEPOTESTOSTERONE CYPIONATE) 200 MG/ML injection Inject 0.5 mLs (100 mg total) into the muscle every 14 (fourteen) days.   [DISCONTINUED] clonazePAM (  KLONOPIN) 1 MG tablet Take 1 mg by mouth 3 (three) times daily as needed.   [DISCONTINUED] gabapentin (NEURONTIN) 600 MG tablet Take 600 mg by mouth 3 (three) times daily.   [DISCONTINUED] testosterone cypionate (DEPOTESTOSTERONE CYPIONATE) 200 MG/ML injection Inject 1 mL (200 mg total) into the muscle every 14 (fourteen) days.   No facility-administered encounter medications on file as of 04/08/2022.   ALLERGIES: Allergies  Allergen Reactions   Ibuprofen Other (See Comments)    Cannot take due to stomach ulcers   Tylenol [Acetaminophen] Other (See Comments)    Cannot take due to stomach ulcers    VACCINATION STATUS: Immunization History  Administered Date(s) Administered   Hepatitis A, Adult 12/24/2021   Hepb-cpg 12/24/2021, 03/02/2022   Influenza Split 07/27/2018   Tdap 06/01/2011, 05/31/2018    HPI: Andre Lynch is a 41 y.o.-year-old man, being seen in consultation for evaluation and management of low testosterone requested by by  his   provider  Billie Lade, MD.  He was found to have hypogonadism with total testostetone of 235 on February 26, 2022.   He admits for decreased libido. Has difficulty obtaining or maintaining an erection. He denies  trauma to testes,  chemotherapy,  testicular irradiation,  nor genitourinary surgery. No h/o cryptorchidism. He denies history of  mumps orchitis/ history of  autoimmune disorders.  He grew and went through puberty like his peers.  He does not father any children and has no plan to father any children.  He wishes to be continued on testosterone treatment. No incomplete/delayed sexual development.     No breast discomfort/gynecomastia. No abnormal sense of smell (only allergies). No hot flushes. No vision problems.  No report of changing headaches. No FH of hypogonadism/infertility . No Family history of hemochromatosis or pituitary tumors. No recent rapid weight change. No chronic diseases. No chronic pain. Not on opiates, does not take steroids.  No more than 2 drinks a day of alcohol at a time, and this is rarely. Admittedly, he has used several courses of opioids for pain control over the years. No anabolic steroids use. No herbal medicines. -He is currently on Suboxone, clonazepam, gabapentin, Robaxin. He was started on testosterone 200 mg IM every 2 weeks and his most recent testosterone was 320 on March 03, 2022.  He does not have family  Or personal history of premature  cardiac disease.   ROS: Constitutional: no weight gain/loss, no fatigue, no subjective hyperthermia/hypothermia Eyes: no blurry vision, no xerophthalmia ENT: no sore throat, no nodules palpated in throat, no dysphagia/odynophagia, no hoarseness Cardiovascular: no CP/SOB/palpitations/leg swelling Respiratory: no cough/SOB Gastrointestinal: no N/V/D/C Musculoskeletal: no muscle/joint aches Skin: no rashes Neurological: no tremors/numbness/tingling/dizziness Psychiatric: no  depression/anxiety  PE: BP 118/76   Pulse 92   Ht 5\' 9"  (1.753 m)   Wt 186 lb 12.8 oz (84.7 kg)   BMI 27.59 kg/m  Wt Readings from Last 3 Encounters:  04/08/22 186 lb 12.8 oz (84.7 kg)  03/18/22 186 lb (84.4 kg)  03/15/22 186 lb 3.2 oz (84.5 kg)   Constitutional:  Body mass index is 27.59 kg/m.,  not in acute distress, + Normal State of Mind Eyes: PERRLA, EOMI, no exophthalmos ENT: moist mucous membranes, no thyromegaly, no cervical lymphadenopathy Cardiovascular: RRR, No MRG Respiratory: CTA B Gastrointestinal: abdomen soft, NT, ND, BS+ Musculoskeletal: no deformities, strength intact in all 4 Skin: moist, warm, no rashes Neurological: no tremor with outstretched hands, DTR normal in all 4 Genital exam: normal male escutcheon, no  inguinal LAD, normal phallus, laterally shrunk testes to  < 15 cc,  no testicular masses, no penile discharge.  No gynecomastia.   CMP ( most recent) CMP     Component Value Date/Time   NA 139 03/02/2022 0918   NA 140 02/09/2022 1027   K 4.0 03/02/2022 0918   CL 105 03/02/2022 0918   CO2 26 03/02/2022 0918   GLUCOSE 87 03/02/2022 0918   BUN 11 03/02/2022 0918   BUN 12 02/09/2022 1027   CREATININE 0.93 03/02/2022 0918   CALCIUM 9.4 03/02/2022 0918   PROT 6.7 03/02/2022 0918   PROT 6.8 02/09/2022 1027   ALBUMIN 4.4 02/09/2022 1027   AST 13 03/02/2022 0918   ALT 9 03/02/2022 0918   ALT 28 09/17/2021 1039   ALKPHOS 62 02/09/2022 1027   BILITOT 0.3 03/02/2022 0918   BILITOT 0.4 02/09/2022 1027   GFRNONAA >60 06/23/2021 1936   GFRAA >60 11/16/2018 1039     Diabetic Labs (most recent): Lab Results  Component Value Date   HGBA1C 5.7 (H) 02/09/2022     Lipid Panel ( most recent) Lipid Panel     Component Value Date/Time   CHOL 175 02/09/2022 1027   TRIG 114 02/09/2022 1027   HDL 52 02/09/2022 1027   CHOLHDL 3.4 02/09/2022 1027   LDLCALC 103 (H) 02/09/2022 1027   LABVLDL 20 02/09/2022 1027      Lab Results  Component Value  Date   TSH 1.280 03/15/2022   TSH 1.690 06/24/2020   FREET4 1.29 03/15/2022      Latest Reference Range & Units 02/26/22 08:12 03/03/22 09:02  Testosterone 264 - 916 ng/dL 956 (L) 213  Testosterone Free 6.8 - 21.5 pg/mL 1.8 (L) 2.7 (L)     ASSESSMENT: 1. Hypogonadism   PLAN:  I have examined the patient, reviewed his labs and have had a long discussion with the patient regarding his testosterone results.   - I have discussed potential causes of hypogonadism, diagnosis of hypogonadism,  and relevant workup to confirm the diagnosis of hypogonadism before initiating testosterone replacement therapy.   -  I also discussed adverse effects of unnecessary testosterone replacement short-term and long-term.  -It is beneficial to determine etiology of hypogonadism before initiation of treatment if possible.  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  Instead of withdrawal of treatment, he is advised to lower his testosterone to 100 mg IM every other week. -I have approached him for more complete laboratory work including repeat of testosterone (total and free), SHBG; FSH/LH; prolactin, ferritin in a morning sample of serum for his next visit.  He will not be considered for pituitary/sella imaging at this time.  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 I gave him the option of staying off of testosterone to keep his fertility, however at this time, he is not interested in keeping his fertility and wishes to be continued on testosterone treatment.  2.  Smoking  The patient was counseled on the dangers of tobacco use, and was advised to quit.  Reviewed strategies to maximize success, including removing cigarettes and smoking materials from environment.    - Time spent with the patient: 50 minutes, of which >50% was  spent in obtaining information about his symptoms, reviewing his previous labs, evaluations, and treatments, counseling him about his hypogonadism, and  developing a plan to confirm the diagnosis and long term treatment as necessary.  Andre Lynch participated in the discussions, expressed understanding, and voiced agreement with the above plans.  All questions were answered to his satisfaction. he is encouraged to contact clinic should he have any questions or concerns prior to his return visit.  Return in about 3 months (around 07/09/2022) for Fasting Labs  in AM B4 8, [place] with Jarrius Huaracha.  Marquis Lunch, MD [facility] [address] [place], [place] [address] Phone: [phone]  Fax: [phone]   04/08/2022, 9:47 PM  This note was partially dictated with voice recognition software. Similar sounding words can be transcribed inadequately or may not  be corrected upon review.

## 2022-04-09 DIAGNOSIS — F17200 Nicotine dependence, unspecified, uncomplicated: Secondary | ICD-10-CM | POA: Insufficient documentation

## 2022-04-11 ENCOUNTER — Encounter: Payer: Self-pay | Admitting: Internal Medicine

## 2022-04-15 ENCOUNTER — Ambulatory Visit (HOSPITAL_COMMUNITY): Payer: 59

## 2022-04-15 ENCOUNTER — Ambulatory Visit: Payer: 59 | Admitting: Orthopaedic Surgery

## 2022-04-15 ENCOUNTER — Encounter: Payer: Self-pay | Admitting: Orthopaedic Surgery

## 2022-04-15 VITALS — BP 120/70 | HR 99 | Ht 69.0 in | Wt 186.0 lb

## 2022-04-15 DIAGNOSIS — M5442 Lumbago with sciatica, left side: Secondary | ICD-10-CM | POA: Diagnosis not present

## 2022-04-15 DIAGNOSIS — G894 Chronic pain syndrome: Secondary | ICD-10-CM

## 2022-04-15 DIAGNOSIS — G8929 Other chronic pain: Secondary | ICD-10-CM | POA: Diagnosis not present

## 2022-04-15 DIAGNOSIS — M5441 Lumbago with sciatica, right side: Secondary | ICD-10-CM | POA: Diagnosis not present

## 2022-04-15 DIAGNOSIS — M545 Low back pain, unspecified: Secondary | ICD-10-CM | POA: Diagnosis not present

## 2022-04-15 MED ORDER — TIZANIDINE HCL 4 MG PO TABS: ORAL_TABLET | ORAL | 3 refills | Status: DC

## 2022-04-15 NOTE — Progress Notes (Signed)
My back is the same  He saw the neurosurgeon.  PT was recommended and he has been going to PT.  He still hurts.  He will ask neurosurgeon at next visit in two weeks about possible epidural.  I will change to Zanaflex.  Stop the Robaxin.  Spine/Pelvis examination:  Inspection:  Overall, sacoiliac joint benign and hips nontender; without crepitus or defects.   Thoracic spine inspection: Alignment normal without kyphosis present   Lumbar spine inspection:  Alignment  with normal lumbar lordosis, without scoliosis apparent.   Thoracic spine palpation:  without tenderness of spinal processes   Lumbar spine palpation: without tenderness of lumbar area; without tightness of lumbar muscles    Range of Motion:   Lumbar flexion, forward flexion is normal without pain or tenderness    Lumbar extension is full without pain or tenderness   Left lateral bend is normal without pain or tenderness   Right lateral bend is normal without pain or tenderness   Straight leg raising is normal  Strength & tone: normal   Stability overall normal stability  Encounter Diagnoses  Name Primary?   Chronic bilateral low back pain with bilateral sciatica Yes   Chronic pain syndrome    Return in one month.  Call if any problem.  Precautions discussed.  Electronically Signed Darreld Mclean, MD 11/16/20238:14 AM

## 2022-04-15 NOTE — Patient Instructions (Addendum)
 As the weather changes and gets cooler, you may notice you are affected more. You may have more pain in your joints. This is normal. Dress warmly and make sure that area is covered well.   Dr.Keeling is here all day on Tuesdays, Wednesday mornings, and Thursday mornings. If you need anything such as a medication refill, please either call BEFORE the end of the day on [facility] or send a message through [employer]. Your pharmacy can send a refill request for you. Calling by the end of the day on [facility] allows [place] time to send Dr.Keeling the request and for him to respond before he leaves on Thursdays.  If Dr. Hilda Lias is out of the office, we may send it to one of the other providers and they may not refill it for the same amount that your original prescription is for.   MY NAME IS  AND I AM DR.KEELING'S ASSISTANT. IF YOU NEED ANYTHING, PLEASE DO NOT HESITATE TO EITHER SEND ME A MESSAGE VIA MYCHART OR CALL THE OFFICE [phone] AND LEAVE A MESSAGE FOR ME. I WILL RESPOND WITHIN 24-48 BUSINESS HOURS.   Ask Dr. Wynetta Emery at [facility] about a possible epidural spine injection.   STOP the Robaxin.   START Tizanidine and take as directed.

## 2022-04-15 NOTE — Therapy (Signed)
OUTPATIENT PHYSICAL THERAPY THORACOLUMBAR TREATMENT      Patient Name: Andre Lynch MRN: [id number] DOB:04/20/1981, 41 y.o., male Today's Date: 04/15/2022   PT End of Session - 04/15/22 0951     Visit Number 5    Number of Visits 12    Date for PT Re-Evaluation 05/19/22    Authorization Type [employer]; no ded; no copay, no VL, no auth required    Progress Note Due on Visit 12    PT Start Time 0950    PT Stop Time 1030    PT Time Calculation (min) 40 min    Activity Tolerance Patient limited by pain;Patient tolerated treatment well    Behavior During Therapy [facility] for tasks assessed/performed              Past Medical History:  Diagnosis Date   Abdominal distension 10/14/2021   Anxiety    Back pain    Breast tenderness 10/14/2021   Chronic hepatitis C without hepatic coma ([place]) 09/17/2021   Chronic knee pain    Depression    Gastric ulcer    Hepatitis-C    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   Intertrigo 10/14/2021   IVDU (intravenous drug user) 09/17/2021   Kidney stones    Low testosterone 03/02/2022   Lower extremity edema 09/17/2021   Lumbago 10/14/2021   RLS (restless legs syndrome)    Seizures ([place])    last one early 2018   Substance abuse ([place])    cocaine, benzos, marijuana, benzos, opiates   Past Surgical History:  Procedure Laterality Date   HERNIA REPAIR     WISDOM TOOTH EXTRACTION     Patient Active Problem List   Diagnosis Date Noted   Current smoker 04/09/2022   Hypogonadism, male 03/02/2022   ADHD 02/10/2022   Tobacco use 02/10/2022   Fatigue 02/10/2022   Preventative health care 02/10/2022   Chronic bilateral low back pain with sciatica 10/14/2021   Abdominal distension 10/14/2021   Breast tenderness 10/14/2021   Intertrigo 10/14/2021   Chronic hepatitis C without hepatic coma ([place]) 09/17/2021   IVDU (intravenous drug user) 09/17/2021   Lower extremity edema 09/17/2021   Hepatitis C antibody positive in blood  06/23/2021   Localized edema 06/23/2021   High risk heterosexual behavior 12/08/2020   Insomnia 09/19/2020   History of manic depressive disorder 09/19/2020   History of intravenous drug abuse ([place]) 09/19/2020   Exposure to human immunodeficiency virus 09/19/2020   Anxiety 09/19/2020   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[place]) 06/27/2020   Mental health disorder    Acute encephalopathy 06/25/2020   Substance use disorder 06/25/2020   Chronic pain syndrome 06/25/2020   Benzodiazepine withdrawal without complication ([place])    Seizures ([place]) 06/24/2020   Cannabis abuse, episodic use 05/17/2020   Opioid use disorder 07/27/2018   Opioid dependence on agonist therapy ([place]) 07/27/2018   Cigarette nicotine dependence without complication 85/88/5027   H/O: substance abuse ([place]) 07/28/2012   GAD (generalized anxiety disorder) 07/28/2012    PCP: Johnette Abraham, MD  REFERRING PROVIDER: Sanjuana Kava, MD   REFERRING DIAG: M54.42,M54.41,G89.29 (ICD-10-CM) - Chronic bilateral low back pain with bilateral sciatica   Rationale for Evaluation and Treatment Rehabilitation  THERAPY DIAG:  Chronic bilateral low back pain with bilateral sciatica  Low back pain, unspecified back pain laterality, unspecified chronicity, unspecified whether sciatica present  ONSET DATE: 2020  SUBJECTIVE:  SUBJECTIVE STATEMENT: Saw [facility] this morning; thinks he would benefit from back injection from neurosurgeon.  Gave him a prescription for Zanaflex   Eval:Has had back trouble for years; in 2020 fell off ladder and landed on tailbone; woke up next morning right leg was numb. Saw [facility]; x-ray degenerative changes; no surgical history. Pain is still in lower back and then up and down back more on right side. Disturbs sleep; increased  pain with standing more than 5 minutes.1/10 to  9/10 at most  PERTINENT HISTORY:  Waiting on disability Low testosterone Walked to therapy lives less than a mile away  PAIN:  Are you having pain? Yes: NPRS scale: 5/10 Pain location: low back right side more than left Pain description: aching, stabbing Aggravating factors: prolonged standing more than 5 minutes Relieving factors: sitting in chair with good posture and support.   PRECAUTIONS: None  WEIGHT BEARING RESTRICTIONS No  FALLS:  Has patient fallen in last 6 months? No  LIVING ENVIRONMENT: Lives with: lives with their family Lives in: House/apartment Stairs: Yes: External: 2 steps; on right going up, on left going up, and can reach both Has following equipment at home: None  OCCUPATION: disability  PLOF: Independent  PATIENT GOALS get my back feeling better   OBJECTIVE:   DIAGNOSTIC FINDINGS:  Trying to get an MRI ordered  PATIENT SURVEYS:  FOTO 39  COGNITION:  Overall cognitive status: Within functional limits for tasks assessed     SENSATION: WFL   POSTURE: rounded shoulders; guarded posture  PALPATION: Tender lumbar paraspinals from T12 down to S1; muscle tightness noted thoracic spine  LUMBAR ROM:   Active  AROM  eval AROM 04/07/22  Flexion 80% available 85% available  Extension 30% available ** 40% available**  Right lateral flexion 40% available**   Left lateral flexion 40% available**   Right rotation    Left rotation     (Blank rows = not tested)   LOWER EXTREMITY MMT:    MMT Right eval Left eval Right 04/07/22 Left 04/07/22  Hip flexion 4-/5 4+/5 4+ 5  Hip extension 3+ 3+ 4 4  Hip abduction      Hip adduction      Hip internal rotation      Hip external rotation      Knee flexion 3 3- 4 4  Knee extension _0 Ankle dorsiflexion _1 Ankle plantarflexion      Ankle inversion      Ankle eversion       (Blank rows = not tested)  FUNCTIONAL TESTS:  5 times sit  to stand: 15.52 sec increased pain 2 minute walk test: not tested    TODAY'S TREATMENT  04/15/22 Prone: Prone lying x 1' Prone on elbows x 1' Glute sets 5" x 10 Hamstring curls 2# 2 x 10 each Hip extension 2 x 10  Supine: LTR x 20 Transverse abdominus contraction 5" hold x 20 Transverse abdominus contraction with ball for hip adduction 5" x 10 Bridge with ball for hip adduction x 10 Bridge with belt for hip abduction x 10 Dead bug 2 x 10  Sit to stand 2 x 10 no UE assist  Standing: Heel/toe raises 2 x 10 Slant board 5 x 20" BTB scapular retraction 2 x 10 BTB shoulder extension 2 x 10    04/07/22 Progress note FOTO 57 5 times sit to stand 11.32 sec MMT's see above  Prone: Prone on elbow x 1' Prone  glute sets 5" x 10  Supine: LTR x 15 Transverse abdominus contraction 5" hold x 10 Transverse abdominus contraction with march x 10 Bridge x 10 Bridge with ball squeeze for hip Adduction x 10      03/09/22 Prone lying x5' with moist heat Glute sets with heel squeeze 5" hold x10  Prone on elbows x10   Supine with emphasis on abdominal bracing:  SLR 2x10  Bridge 2x10  Isometric hip adduction 2x10  Isometric hip abduction 2x10  Ball press 2x10  4# DB overhead shoulder flexion 2x10   Sidelying clamshell 2x10    Standing RTB shoulder extension 2x10   03/03/22 Review of HEP and goals Prone: Prone lying x 5' with moist heat Glute sets 5" hold x 10  Supine: LTR x 15 Abdominal bracing 5" x 10 Abdominal bracing with march 2 x 10 Abdominal bracing with hip adduction with ball 5" hold x 10 Abdominal bracing with hip abduction with RTB 2 x 10 Bridge x 5     Physical therapy evaluation and HEP.   PATIENT EDUCATION:  Education details: Patient educated on exam findings, POC, scope of PT, HEP, and good sitting posture. Person educated: Patient Education method: Explanation, Demonstration, and Handouts Education comprehension: verbalized  understanding, returned demonstration, verbal cues required, and tactile cues required   HOME EXERCISE PROGRAM: 04/15/22 scapular retractions, shoulder extensions, calf stretch Access Code: [email or web address] URL: [email or web address] Date: 03/03/2022 Prepared by: [facility] - Rehab  Exercises - Lying Prone  - 3 x daily - 7 x weekly - 3 sets - 10 reps - 5-10 min hold - Supine Lower Trunk Rotation  - 3 x daily - 7 x weekly - 1 sets - 10 reps - 2-3"hold hold - Supine Gluteal Sets  - 3 x daily - 7 x weekly - 1 sets - 10 reps - 5" hold - Correct Seated Posture  - 1 x daily - 7 x weekly - 1 sets - 1 reps - Supine Transversus Abdominis Bracing - Hands on Stomach  - 3 x daily - 7 x weekly - 1 sets - 10 reps - 5" hold - Supine March  - 3 x daily - 7 x weekly - 2 sets - 10 reps - Supine Hip Adduction Isometric with Ball  - 3 x daily - 7 x weekly - 1 sets - 10 reps - Hooklying Clamshell with Resistance  - 3 x daily - 7 x weekly - 2 sets - 10 reps  ASSESSMENT:  CLINICAL IMPRESSION: Today's session continued with lumbar mobility and core and lower extremity strengthening.  Added weighted hamstring curls today and dead bug for core strengthening.  Progressed to standing exercise without issue.  Increased thera band strength and issued blue theraband and anchor for home use with HEP. Updated HEP.  Patient will continue to benefit from skilled physical therapy services to further improve independence with functional mobility and to address unmet and partially met goals.     OBJECTIVE IMPAIRMENTS decreased activity tolerance, decreased endurance, decreased knowledge of condition, decreased mobility, decreased ROM, decreased strength, hypomobility, increased fascial restrictions, impaired perceived functional ability, increased muscle spasms, impaired flexibility, postural dysfunction, and pain.   ACTIVITY LIMITATIONS carrying, lifting, bending, sitting, standing, squatting, sleeping, stairs, transfers, reach  over head, hygiene/grooming, locomotion level, and caring for others  PARTICIPATION LIMITATIONS: meal prep, cleaning, laundry, community activity, occupation, and yard work   [employer] POTENTIAL: Good  CLINICAL DECISION MAKING: Stable/uncomplicated  EVALUATION COMPLEXITY: Low   GOALS: Goals reviewed with patient?  Yes  SHORT TERM GOALS: Target date: 03/19/2022   patient will be independent with initial HEP  Baseline: Goal status: MET  2.  Patient will improve 5 x STS score from 15.52 sec to 14 sec to demonstrate improved functional mobility and increased lower extremity strength.  Baseline: 04/07/22 11/32 Goal status: MET  LONG TERM GOALS: Target date: 04/09/2022  Patient will be independent in self management strategies to improve quality of life and functional outcomes.  Baseline:  Goal status: IN PROGRESS  2.  Patient will improve FOTO score to predicted value to demonstrate improved functional mobility  Baseline: 39; 57 04/07/22 Goal status: MET  3.  Patient will report at least 50% improvement in overall symptoms and/or function to demonstrate improved functional mobility  Baseline:  Goal status: IN PROGRESS  4.  Patient will improve 5 x STS score from 15.54 sec to 12 sec to demonstrate improved functional mobility and increased lower extremity strength.  Baseline: 04/07/22 11.32 sec Goal status: MET  5.   Patient will increase tested lower extremity MMT's to 5/5 to promote return to ambulation community distances with minimal deviation.  Baseline: see above Goal status: IN PROGRESS   PLAN: PT FREQUENCY: 1x/week  PT DURATION: 6 weeks  PLANNED INTERVENTIONS: Therapeutic exercises, Therapeutic activity, Neuromuscular re-education, Balance training, Gait training, Patient/Family education, Joint manipulation, Joint mobilization, Stair training, Orthotic/Fit training, DME instructions, Aquatic Therapy, Dry Needling, Electrical stimulation, Spinal manipulation,  Spinal mobilization, Cryotherapy, Moist heat, Compression bandaging, scar mobilization, Splintting, Taping, Traction, Ultrasound, Ionotophoresis 17m/ml Dexamethasone, and Manual therapy   PLAN FOR NEXT SESSION:  lumbar mobility and core strengthening; increase volume/load and progress into seated and standing positions as able. Extend therapy 1 x a week x 6 weeks per Dr. CSaintclair Halsted seeing both KLuna Glasgowand CSaintclair Halstedat this time.   9:52 AM, 04/15/22 Zayden Hahne Small Odyn Turko MPT [facility] [place] #[phone]

## 2022-04-19 ENCOUNTER — Encounter: Payer: Self-pay | Admitting: Internal Medicine

## 2022-04-19 ENCOUNTER — Encounter (HOSPITAL_COMMUNITY): Payer: 59

## 2022-04-19 ENCOUNTER — Ambulatory Visit (INDEPENDENT_AMBULATORY_CARE_PROVIDER_SITE_OTHER): Payer: 59 | Admitting: Internal Medicine

## 2022-04-19 VITALS — BP 125/80 | HR 80 | Ht 69.0 in | Wt 189.2 lb

## 2022-04-19 DIAGNOSIS — E291 Testicular hypofunction: Secondary | ICD-10-CM

## 2022-04-19 NOTE — Patient Instructions (Signed)
It was a pleasure to see you today.  Thank you for giving [place] the opportunity to be involved in your care.  Below is a brief recap of your visit and next steps.  We will plan to see you again in 4 weeks (12/14)  Summary No medication changes today We will repeat labs before 12/14 and follow up on that date

## 2022-04-19 NOTE — Assessment & Plan Note (Signed)
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  He previously met criteria for diagnosis based on multiple low 8 AM testosterone levels with normal FSH/LH levels.  I suspect that his hypogonadism is attributable to previous opioid use.  He was referred to endocrinology for evaluation, but his preference is to not continue to receive care at their office.  He would prefer for me to manage his hypogonadism.  His last testosterone injection was 11/16 and he states that injections have benefited his energy and libido levels. -Andre Lynch's next injection will be on 11/30.  I have requested repeat labs prior to the date of his first injection in December (12/14).  We will plan for follow-up at that time to assess his testosterone levels.  I reviewed with him that ideally we can keep him on the lowest effective dose of testosterone, which may mean decreasing to 100 mg biweekly depending on his lab results.  He expressed understanding.  We will plan for follow-up on 12/14.

## 2022-04-19 NOTE — Progress Notes (Signed)
Established Patient Office Visit  Subjective   Patient ID: Andre Lynch, male    DOB: [birth year]  Age: 41 y.o. MRN: [id number]  Chief Complaint  Patient presents with   Follow-up   Andre Lynch returns to care today.  He was last seen by me on 10/16 at which time testosterone therapy was prescribed for treatment of secondary hypogonadism.  A new psychiatry referral was also placed at his request due to his displeasure with his psychiatrist at that time.  In the interim he presented to the ED on 10/27 for anxiety and eventually eloped.  Has been seen by endocrinology and orthopedic surgery this month.  Andre Lynch reports feeling well today.  He continues to endorse chronic back pain and states that he is scheduled to see neurosurgery later this month.  He has been completing testosterone injections as previously prescribed.  His last injection was last Thursday (11/16).  He has noted increased energy and libido since starting testosterone injections.  He has no additional concerns to discuss today.  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   Intertrigo 10/14/2021   IVDU (intravenous drug user) 09/17/2021   Kidney stones    Low testosterone 03/02/2022   Lower extremity edema 09/17/2021   Lumbago 10/14/2021   RLS (restless legs syndrome)    Seizures ([place])    last one early 2018   Substance abuse ([facility])    cocaine, benzos, marijuana, benzos, opiates   Past Surgical History:  Procedure Laterality Date   HERNIA REPAIR     WISDOM TOOTH EXTRACTION     Social History   Tobacco Use   Smoking status: Every Day    Packs/day: 0.50    Years: 20.00    Total pack years: 10.00    Types: Cigarettes   Smokeless tobacco: Never   Vaping Use   Vaping Use: Some days  Substance Use Topics   Alcohol use: Not Currently   Drug use: Not Currently   Family History  Problem Relation Age of Onset   Thyroid disease Mother    Cancer Mother        breast cancer survivor   Hypertension Father    Allergies  Allergen Reactions   Ibuprofen Other (See Comments)    Cannot take due to stomach ulcers   Tylenol [Acetaminophen] Other (See Comments)    Cannot take due to stomach ulcers   Review of Systems  Musculoskeletal:  Positive for back pain (chronic lumbar back pain).     Objective:     BP 125/80   Pulse 80   Ht _0  (1.753 m)   Wt 189 lb 3.2 oz (85.8 kg)   SpO2 96%   BMI 27.94 kg/m   Physical Exam Vitals reviewed.  Constitutional:      General: He is not in acute distress.    Appearance: Normal appearance. He is not ill-appearing.  HENT:     Head: Normocephalic and atraumatic.     Nose: Nose normal. No congestion or rhinorrhea.     Mouth/Throat:     Mouth: Mucous membranes are moist.     Pharynx: Oropharynx is clear.  Eyes:  Extraocular Movements: Extraocular movements intact.     Conjunctiva/sclera: Conjunctivae normal.     Pupils: Pupils are equal, round, and reactive to light.  Cardiovascular:     Rate and Rhythm: Normal rate and regular rhythm.     Pulses: Normal pulses.     Heart sounds: Normal heart sounds. No murmur heard. Pulmonary:     Effort: Pulmonary effort is normal.     Breath sounds: Normal breath sounds. No wheezing, rhonchi or rales.  Abdominal:     General: Abdomen is flat. Bowel sounds are normal. There is no distension.     Palpations: Abdomen is soft.     Tenderness: There is no abdominal tenderness.  Musculoskeletal:        General: No swelling or deformity. Normal range of motion.     Cervical back: Normal range of motion.  Skin:    General: Skin is warm and dry.     Capillary Refill: Capillary refill takes less than 2 seconds.  Neurological:     General: No  focal deficit present.     Mental Status: He is alert and oriented to person, place, and time.     Motor: No weakness.  Psychiatric:        Mood and Affect: Mood normal.        Behavior: Behavior normal.        Thought Content: Thought content normal.    Last CBC Lab Results  Component Value Date   WBC 5.3 03/02/2022   HGB 15.1 03/02/2022   HCT 45.0 03/02/2022   MCV 97.6 03/02/2022   MCH 32.8 03/02/2022   RDW 11.9 03/02/2022   PLT 283 16/03/9603   Last metabolic panel Lab Results  Component Value Date   GLUCOSE 87 03/02/2022   NA 139 03/02/2022   K 4.0 03/02/2022   CL 105 03/02/2022   CO2 26 03/02/2022   BUN 11 03/02/2022   CREATININE 0.93 03/02/2022   EGFR 105 03/02/2022   CALCIUM 9.4 03/02/2022   PHOS 3.2 06/25/2020   PROT 6.7 03/02/2022   ALBUMIN 4.4 02/09/2022   LABGLOB 2.4 02/09/2022   AGRATIO 1.8 02/09/2022   BILITOT 0.3 03/02/2022   ALKPHOS 62 02/09/2022   AST 13 03/02/2022   ALT 9 03/02/2022   ANIONGAP 9 06/23/2021   Last lipids Lab Results  Component Value Date   CHOL 175 02/09/2022   HDL 52 02/09/2022   LDLCALC 103 (H) 02/09/2022   TRIG 114 02/09/2022   CHOLHDL 3.4 02/09/2022   Last hemoglobin A1c Lab Results  Component Value Date   HGBA1C 5.7 (H) 02/09/2022   Last thyroid functions Lab Results  Component Value Date   TSH 1.280 03/15/2022   The 10-year ASCVD risk score (Arnett DK, et al., 2019) is: 3.1%    Assessment & Plan:   Problem List Items Addressed This Visit       Hypogonadism, male - Primary    He returns to care today to 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  He previously met criteria for diagnosis based on multiple low 8 AM testosterone levels with normal FSH/LH levels.  I suspect that his hypogonadism is attributable to previous opioid use.  He was referred to endocrinology for evaluation, but his preference is to not continue to receive care at their office.  He would prefer for me to manage his hypogonadism.  His last  testosterone injection was 11/16 and he states that injections have benefited his energy and libido levels. -Andre Lynch's next injection will  be on 11/30.  I have requested repeat labs prior to the date of his first injection in December (12/14).  We will plan for follow-up at that time to assess his testosterone levels.  I reviewed with him that ideally we can keep him on the lowest effective dose of testosterone, which may mean decreasing to 100 mg biweekly depending on his lab results.  He expressed understanding.  We will plan for follow-up on 12/14.       Return in about 24 days (around 05/13/2022) for HypoG.    Johnette Abraham, MD

## 2022-04-28 ENCOUNTER — Encounter: Payer: Self-pay | Admitting: Orthopaedic Surgery

## 2022-04-28 ENCOUNTER — Encounter (HOSPITAL_COMMUNITY): Payer: 59

## 2022-04-28 MED ORDER — CYCLOBENZAPRINE HCL 10 MG PO TABS: 10.0000 mg | ORAL_TABLET | Freq: Every day | ORAL | 0 refills | Status: DC

## 2022-04-29 DIAGNOSIS — Z6828 Body mass index (BMI) 28.0-28.9, adult: Secondary | ICD-10-CM | POA: Diagnosis not present

## 2022-04-29 DIAGNOSIS — M5440 Lumbago with sciatica, unspecified side: Secondary | ICD-10-CM | POA: Diagnosis not present

## 2022-04-29 DIAGNOSIS — M47816 Spondylosis without myelopathy or radiculopathy, lumbar region: Secondary | ICD-10-CM | POA: Diagnosis not present

## 2022-04-30 DIAGNOSIS — Z419 Encounter for procedure for purposes other than remedying health state, unspecified: Secondary | ICD-10-CM | POA: Diagnosis not present

## 2022-05-03 ENCOUNTER — Encounter (HOSPITAL_COMMUNITY): Payer: 59

## 2022-05-05 DIAGNOSIS — Z79899 Other long term (current) drug therapy: Secondary | ICD-10-CM | POA: Diagnosis not present

## 2022-05-10 ENCOUNTER — Encounter (HOSPITAL_COMMUNITY): Payer: 59

## 2022-05-10 ENCOUNTER — Telehealth: Payer: Self-pay | Admitting: Orthopaedic Surgery

## 2022-05-10 NOTE — Telephone Encounter (Signed)
Patient called to cancel his appointment with Dr. Hilda Lias for 05/14/23.  He stated that he was able to get his shots from the neurosurgeon.  He wanted Dr. Hilda Lias to know.

## 2022-05-11 ENCOUNTER — Other Ambulatory Visit: Payer: Self-pay

## 2022-05-11 DIAGNOSIS — R5383 Other fatigue: Secondary | ICD-10-CM

## 2022-05-11 DIAGNOSIS — E291 Testicular hypofunction: Secondary | ICD-10-CM

## 2022-05-11 DIAGNOSIS — R7989 Other specified abnormal findings of blood chemistry: Secondary | ICD-10-CM

## 2022-05-13 ENCOUNTER — Ambulatory Visit: Payer: 59 | Admitting: Orthopaedic Surgery

## 2022-05-13 ENCOUNTER — Ambulatory Visit (INDEPENDENT_AMBULATORY_CARE_PROVIDER_SITE_OTHER): Payer: 59 | Admitting: Internal Medicine

## 2022-05-13 ENCOUNTER — Encounter: Payer: Self-pay | Admitting: Internal Medicine

## 2022-05-13 ENCOUNTER — Telehealth: Payer: Self-pay | Admitting: Internal Medicine

## 2022-05-13 ENCOUNTER — Other Ambulatory Visit: Payer: Self-pay | Admitting: Internal Medicine

## 2022-05-13 ENCOUNTER — Other Ambulatory Visit: Payer: Self-pay

## 2022-05-13 VITALS — BP 128/89 | HR 93 | Ht 69.0 in | Wt 192.4 lb

## 2022-05-13 DIAGNOSIS — E611 Iron deficiency: Secondary | ICD-10-CM | POA: Diagnosis not present

## 2022-05-13 DIAGNOSIS — F17200 Nicotine dependence, unspecified, uncomplicated: Secondary | ICD-10-CM

## 2022-05-13 DIAGNOSIS — E291 Testicular hypofunction: Secondary | ICD-10-CM

## 2022-05-13 DIAGNOSIS — Z720 Tobacco use: Secondary | ICD-10-CM

## 2022-05-13 MED ORDER — TESTOSTERONE CYPIONATE 200 MG/ML IM SOLN: 200.0000 mg | INTRAMUSCULAR | 0 refills | Status: DC

## 2022-05-13 MED ORDER — "BD SAFETYGLIDE SHIELDED NEEDLE 21G X 1-1/2"" MISC": 1.0000 | 0 refills | Status: DC

## 2022-05-13 MED ORDER — IRON (FERROUS SULFATE) 325 (65 FE) MG PO TABS: 325.0000 mg | ORAL_TABLET | Freq: Every day | ORAL | 0 refills | Status: AC

## 2022-05-13 MED ORDER — "BD SYRINGE/NEEDLE 23G X 1"" 3 ML MISC": 1.0000 | 0 refills | Status: DC

## 2022-05-13 NOTE — Telephone Encounter (Signed)
Med has not been received at pharmcy testosterone cypionate (DEPOTESTOSTERONE CYPIONATE) 200 MG/ML injection [[id number]]   [facility]  Patient was just seen.

## 2022-05-13 NOTE — Telephone Encounter (Signed)
Returned call

## 2022-05-13 NOTE — Progress Notes (Signed)
Established Patient Office Visit  Subjective   Patient ID: Andre Lynch, male    DOB: 20-Jan-1981  Age: 41 y.o. MRN: [id number]  Chief Complaint  Patient presents with   Hypogonadism    Follow up   Andre Lynch returns to care today for follow-up.  He was last seen by me 11/20 for follow-up of hypogonadism.  There have been no acute interval events.  Today he reports feeling well.  He has no acute concerns to discuss.  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   Intertrigo 10/14/2021   IVDU (intravenous drug user) 09/17/2021   Kidney stones    Low testosterone 03/02/2022   Lower extremity edema 09/17/2021   Lumbago 10/14/2021   RLS (restless legs syndrome)    Seizures ([place])    last one early 2018   Substance abuse ([facility])    cocaine, benzos, marijuana, benzos, opiates   Past Surgical History:  Procedure Laterality Date   HERNIA REPAIR     WISDOM TOOTH EXTRACTION     Social History   Tobacco Use   Smoking status: Every Day    Packs/day: 0.50    Years: 20.00    Total pack years: 10.00    Types: Cigarettes   Smokeless tobacco: Never  Vaping Use   Vaping Use: Some days  Substance Use Topics   Alcohol use: Not Currently   Drug use: Not Currently   Family History  Problem Relation Age of Onset   Thyroid disease Mother    Cancer Mother        breast cancer survivor   Hypertension Father    Allergies  Allergen Reactions   Ibuprofen Other (See Comments)    Cannot take due to stomach ulcers   Tylenol [Acetaminophen] Other (See Comments)    Cannot take due to stomach ulcers   Review of Systems  Musculoskeletal:  Positive for back pain (Chronic lumbar back pain).  All other systems reviewed and are  negative.    Objective:     BP 128/89   Pulse 93   Ht _0  (1.753 m)   Wt 192 lb 6.4 oz (87.3 kg)   SpO2 94%   BMI 28.41 kg/m  BP Readings from Last 3 Encounters:  05/13/22 128/89  04/19/22 125/80  04/15/22 120/70   Physical Exam Vitals reviewed.  Constitutional:      General: He is not in acute distress.    Appearance: Normal appearance. He is not ill-appearing.  HENT:     Head: Normocephalic and atraumatic.     Nose: Nose normal. No congestion or rhinorrhea.     Mouth/Throat:     Mouth: Mucous membranes are moist.     Pharynx: Oropharynx is clear.  Eyes:     Extraocular Movements: Extraocular movements intact.     Conjunctiva/sclera: Conjunctivae normal.     Pupils: Pupils are equal, round, and reactive to light.  Cardiovascular:     Rate and Rhythm: Normal rate and regular rhythm.     Pulses: Normal pulses.     Heart sounds: Normal heart sounds. No murmur heard. Pulmonary:     Effort: Pulmonary effort is  normal.     Breath sounds: Normal breath sounds. No wheezing, rhonchi or rales.  Abdominal:     General: Abdomen is flat. Bowel sounds are normal. There is no distension.     Palpations: Abdomen is soft.     Tenderness: There is no abdominal tenderness.  Musculoskeletal:        General: No swelling or deformity. Normal range of motion.     Cervical back: Normal range of motion.  Skin:    General: Skin is warm and dry.     Capillary Refill: Capillary refill takes less than 2 seconds.  Neurological:     General: No focal deficit present.     Mental Status: He is alert and oriented to person, place, and time.     Motor: No weakness.  Psychiatric:        Mood and Affect: Mood normal.        Behavior: Behavior normal.        Thought Content: Thought content normal.    Last CBC Lab Results  Component Value Date   WBC 6.6 05/11/2022   HGB 14.9 05/11/2022   HCT 42.6 05/11/2022   MCV 94 05/11/2022   MCH 33.0 05/11/2022   RDW 11.6 05/11/2022   PLT 307  42/87/6811   Last metabolic panel Lab Results  Component Value Date   GLUCOSE 87 03/02/2022   NA 139 03/02/2022   K 4.0 03/02/2022   CL 105 03/02/2022   CO2 26 03/02/2022   BUN 11 03/02/2022   CREATININE 0.93 03/02/2022   EGFR 105 03/02/2022   CALCIUM 9.4 03/02/2022   PHOS 3.2 06/25/2020   PROT 6.7 03/02/2022   ALBUMIN 4.4 02/09/2022   LABGLOB 2.4 02/09/2022   AGRATIO 1.8 02/09/2022   BILITOT 0.3 03/02/2022   ALKPHOS 62 02/09/2022   AST 13 03/02/2022   ALT 9 03/02/2022   ANIONGAP 9 06/23/2021   Last lipids Lab Results  Component Value Date   CHOL 175 02/09/2022   HDL 52 02/09/2022   LDLCALC 103 (H) 02/09/2022   TRIG 114 02/09/2022   CHOLHDL 3.4 02/09/2022   Last hemoglobin A1c Lab Results  Component Value Date   HGBA1C 5.7 (H) 02/09/2022   Last thyroid functions Lab Results  Component Value Date   TSH 1.280 03/15/2022   The 10-year ASCVD risk score (Arnett DK, et al., 2019) is: 3.2%    Assessment & Plan:   Problem List Items Addressed This Visit       Hypogonadism, male    Currently on testosterone supplementation, 200 mg biweekly injections.  He endorses symptomatic improvement since starting testosterone therapy.  His testosterone level was checked earlier this week and is within goal. -No changes today.  Continue current therapy.      Tobacco use    He continues to smoke 0.5 packs/day of cigarettes and has been smoking since age 2.  Again expresses an interest in quitting but declines cessation resources. -The patient was counseled on the dangers of tobacco use, and was advised to quit.  Reviewed strategies to maximize success, including removing cigarettes and smoking materials from environment, stress management, substitution of other forms of reinforcement, support of family/friends, and written materials.       Iron deficiency - Primary    Noted on labs from earlier this week.  I have recommended that he start iron supplementation.  Rx provided  today.      Return in about 3 months (around 08/12/2022).    Doren Custard  Malcolm Metro, MD

## 2022-05-13 NOTE — Patient Instructions (Addendum)
It was a pleasure to see you today.  Thank you for giving [place] the opportunity to be involved in your care.  Below is a brief recap of your visit and next steps.  We will plan to see you again in 3 months.  Summary Start iron supplementation for iron deficiency We will plan for follow up in 3 months.

## 2022-05-14 LAB — CBC WITH DIFFERENTIAL/PLATELET
Basophils Absolute: 0 10*3/uL (ref 0.0–0.2)
Basos: 1 %
EOS (ABSOLUTE): 0.2 10*3/uL (ref 0.0–0.4)
Eos: 2 %
Hematocrit: 42.6 % (ref 37.5–51.0)
Hemoglobin: 14.9 g/dL (ref 13.0–17.7)
Immature Grans (Abs): 0 10*3/uL (ref 0.0–0.1)
Immature Granulocytes: 0 %
Lymphocytes Absolute: 2.7 10*3/uL (ref 0.7–3.1)
Lymphs: 40 %
MCH: 33 pg (ref 26.6–33.0)
MCHC: 35 g/dL (ref 31.5–35.7)
MCV: 94 fL (ref 79–97)
Monocytes Absolute: 0.6 10*3/uL (ref 0.1–0.9)
Monocytes: 9 %
Neutrophils Absolute: 3.2 10*3/uL (ref 1.4–7.0)
Neutrophils: 48 %
Platelets: 307 10*3/uL (ref 150–450)
RBC: 4.52 x10E6/uL (ref 4.14–5.80)
RDW: 11.6 % (ref 11.6–15.4)
WBC: 6.6 10*3/uL (ref 3.4–10.8)

## 2022-05-14 LAB — IRON,TIBC AND FERRITIN PANEL
Ferritin: 24 ng/mL — ABNORMAL LOW (ref 30–400)
Iron Saturation: 15 % (ref 15–55)
Iron: 51 ug/dL (ref 38–169)
Total Iron Binding Capacity: 345 ug/dL (ref 250–450)
UIBC: 294 ug/dL (ref 111–343)

## 2022-05-14 LAB — PSA: Prostate Specific Ag, Serum: 1.4 ng/mL (ref 0.0–4.0)

## 2022-05-14 LAB — TESTOSTERONE,FREE AND TOTAL
Testosterone, Free: 12.5 pg/mL (ref 6.8–21.5)
Testosterone: 661 ng/dL (ref 264–916)

## 2022-05-17 ENCOUNTER — Encounter (HOSPITAL_COMMUNITY): Payer: 59

## 2022-05-18 ENCOUNTER — Encounter (HOSPITAL_COMMUNITY): Payer: 59

## 2022-05-19 DIAGNOSIS — E611 Iron deficiency: Secondary | ICD-10-CM | POA: Insufficient documentation

## 2022-05-19 NOTE — Assessment & Plan Note (Signed)
Currently on testosterone supplementation, 200 mg biweekly injections.  He endorses symptomatic improvement since starting testosterone therapy.  His testosterone level was checked earlier this week and is within goal. -No changes today.  Continue current therapy.

## 2022-05-19 NOTE — Assessment & Plan Note (Signed)
He continues to smoke 0.5 packs/day of cigarettes and has been smoking since age 41.  Again expresses an interest in quitting but declines cessation resources. -The patient was counseled on the dangers of tobacco use, and was advised to quit.  Reviewed strategies to maximize success, including removing cigarettes and smoking materials from environment, stress management, substitution of other forms of reinforcement, support of family/friends, and written materials.

## 2022-05-19 NOTE — Assessment & Plan Note (Signed)
Noted on labs from earlier this week.  I have recommended that he start iron supplementation.  Rx provided today.

## 2022-05-31 DIAGNOSIS — Z419 Encounter for procedure for purposes other than remedying health state, unspecified: Secondary | ICD-10-CM | POA: Diagnosis not present

## 2022-06-02 ENCOUNTER — Telehealth: Payer: Self-pay | Admitting: Orthopaedic Surgery

## 2022-06-02 DIAGNOSIS — F411 Generalized anxiety disorder: Secondary | ICD-10-CM | POA: Diagnosis not present

## 2022-06-02 DIAGNOSIS — F4312 Post-traumatic stress disorder, chronic: Secondary | ICD-10-CM | POA: Diagnosis not present

## 2022-06-02 DIAGNOSIS — Z79899 Other long term (current) drug therapy: Secondary | ICD-10-CM | POA: Diagnosis not present

## 2022-06-02 DIAGNOSIS — F900 Attention-deficit hyperactivity disorder, predominantly inattentive type: Secondary | ICD-10-CM | POA: Diagnosis not present

## 2022-06-02 DIAGNOSIS — Z5181 Encounter for therapeutic drug level monitoring: Secondary | ICD-10-CM | POA: Diagnosis not present

## 2022-06-02 DIAGNOSIS — F319 Bipolar disorder, unspecified: Secondary | ICD-10-CM | POA: Diagnosis not present

## 2022-06-02 MED ORDER — CYCLOBENZAPRINE HCL 10 MG PO TABS: 10.0000 mg | ORAL_TABLET | Freq: Every day | ORAL | 0 refills | Status: DC

## 2022-06-03 DIAGNOSIS — M47816 Spondylosis without myelopathy or radiculopathy, lumbar region: Secondary | ICD-10-CM | POA: Diagnosis not present

## 2022-06-03 DIAGNOSIS — Z6828 Body mass index (BMI) 28.0-28.9, adult: Secondary | ICD-10-CM | POA: Diagnosis not present

## 2022-06-07 ENCOUNTER — Other Ambulatory Visit: Payer: Self-pay | Admitting: Internal Medicine

## 2022-06-07 DIAGNOSIS — E291 Testicular hypofunction: Secondary | ICD-10-CM

## 2022-06-07 MED ORDER — TESTOSTERONE CYPIONATE 200 MG/ML IM SOLN: 200.0000 mg | INTRAMUSCULAR | 0 refills | Status: DC

## 2022-06-07 MED ORDER — "BD SYRINGE/NEEDLE 23G X 1"" 3 ML MISC": 1.0000 | 0 refills | Status: DC

## 2022-06-07 MED ORDER — "BD SAFETYGLIDE SHIELDED NEEDLE 21G X 1-1/2"" MISC": 1.0000 | 0 refills | Status: DC

## 2022-06-11 ENCOUNTER — Encounter: Payer: Self-pay | Admitting: Internal Medicine

## 2022-06-11 NOTE — Telephone Encounter (Signed)
Appointment scheduled 01.16.2023 with Dr Court Joy.

## 2022-06-15 ENCOUNTER — Ambulatory Visit: Payer: 59 | Admitting: Internal Medicine

## 2022-06-22 ENCOUNTER — Ambulatory Visit (INDEPENDENT_AMBULATORY_CARE_PROVIDER_SITE_OTHER): Payer: Medicaid Other | Admitting: Internal Medicine

## 2022-06-22 ENCOUNTER — Encounter: Payer: Self-pay | Admitting: Internal Medicine

## 2022-06-22 VITALS — BP 135/90 | HR 100 | Ht 69.0 in | Wt 195.8 lb

## 2022-06-22 DIAGNOSIS — Z23 Encounter for immunization: Secondary | ICD-10-CM | POA: Diagnosis not present

## 2022-06-22 DIAGNOSIS — L304 Erythema intertrigo: Secondary | ICD-10-CM | POA: Diagnosis not present

## 2022-06-22 DIAGNOSIS — R600 Localized edema: Secondary | ICD-10-CM | POA: Diagnosis not present

## 2022-06-22 MED ORDER — FLUCONAZOLE 100 MG PO TABS: 100.0000 mg | ORAL_TABLET | Freq: Every day | ORAL | 0 refills | Status: AC

## 2022-06-22 NOTE — Patient Instructions (Signed)
It was a pleasure to see you today.  Thank you for giving [place] the opportunity to be involved in your care.  Below is a brief recap of your visit and next steps.  We will plan to see you again in March.  Summary Start fluconazole x 7 days for groin rash You will receive your flu shot today Follow up in March

## 2022-06-22 NOTE — Progress Notes (Signed)
Acute Office Visit  Subjective:     Patient ID: Andre Lynch, male    DOB: Jan 21, 1981, 42 y.o.   MRN: [id number]  Chief Complaint  Patient presents with   Foot Swelling    Hands and feet swelling   Andre Lynch presents today for an acute visit to discuss transient edema in his hands and feet bilaterally.  He states that now the edema has mostly resolved compared to when he called to schedule his appointment.  This has happened multiple times on and off for the last 1.5 years.  He denies associated symptoms such as dyspnea on exertion, orthopnea/PND.  He also endorses an inguinal rash today that has previously been treated with fluconazole.  He is interested in a refill of medication today.  Andre Lynch would also like to receive his flu shot.  Review of Systems  Respiratory:  Negative for shortness of breath.   Cardiovascular:  Negative for orthopnea, leg swelling and PND.  Musculoskeletal:        Edema in hands and feet bilaterally  Skin:  Positive for rash (Inguinal rash).  All other systems reviewed and are negative.     Objective:    BP (!) 135/90   Pulse 100   Ht 5\' 9"  (1.753 m)   Wt 195 lb 12.8 oz (88.8 kg)   SpO2 96%   BMI 28.91 kg/m   Physical Exam Vitals reviewed.  Constitutional:      Appearance: Normal appearance.  HENT:     Head: Normocephalic and atraumatic.  Cardiovascular:     Rate and Rhythm: Normal rate and regular rhythm.  Pulmonary:     Effort: Pulmonary effort is normal.     Breath sounds: Normal breath sounds.  Abdominal:     General: Abdomen is flat. Bowel sounds are normal.     Palpations: Abdomen is soft.  Musculoskeletal:     Right lower leg: No edema.     Left lower leg: No edema.     Comments: Trace edema encompassing the left hand.  No edema appreciated in the right hand.  No pedal edema appreciated bilaterally.  Skin:    Findings: Rash (Intertriginous bilateral inguinal rash) present.  Neurological:     Mental Status: He is alert.        Assessment & Plan:   Problem List Items Addressed This Visit       Intertrigo - Primary    Noted on exam.  I have prescribed fluconazole 100 mg daily x 7 days.      Localized edema    Presents today for an acute visit for evaluation of edema in the hands and feet bilaterally.  This occurs intermittently and has mostly resolved today compared to when he called to schedule appointment.  No additional signs/symptoms of volume overload.  -I suspect this is a side effect of gabapentin.  Previous workup for cardiac or protein abnormalities has been unremarkable.  No medication changes today.  He is not interested in discontinuing or adjusting his current gabapentin dose.      Need for influenza vaccination    Influenza vaccine administered today      Meds ordered this encounter  Medications   fluconazole (DIFLUCAN) 100 MG tablet    Sig: Take 1 tablet (100 mg total) by mouth daily for 7 days.    Dispense:  7 tablet    Refill:  0   Return if symptoms worsen or fail to improve.  Johnette Abraham, MD

## 2022-06-22 NOTE — Assessment & Plan Note (Signed)
Presents today for an acute visit for evaluation of edema in the hands and feet bilaterally.  This occurs intermittently and has mostly resolved today compared to when he called to schedule appointment.  No additional signs/symptoms of volume overload.  -I suspect this is a side effect of gabapentin.  Previous workup for cardiac or protein abnormalities has been unremarkable.  No medication changes today.  He is not interested in discontinuing or adjusting his current gabapentin dose.

## 2022-06-22 NOTE — Assessment & Plan Note (Signed)
Noted on exam.  I have prescribed fluconazole 100 mg daily x 7 days.

## 2022-06-22 NOTE — Assessment & Plan Note (Signed)
Influenza vaccine administered today.

## 2022-06-28 ENCOUNTER — Encounter (HOSPITAL_COMMUNITY): Payer: Self-pay | Admitting: Physical Therapy

## 2022-06-28 ENCOUNTER — Ambulatory Visit (HOSPITAL_COMMUNITY): Payer: Medicaid Other | Attending: Orthopaedic Surgery | Admitting: Physical Therapy

## 2022-06-28 DIAGNOSIS — R29898 Other symptoms and signs involving the musculoskeletal system: Secondary | ICD-10-CM | POA: Diagnosis not present

## 2022-06-28 DIAGNOSIS — M5459 Other low back pain: Secondary | ICD-10-CM | POA: Insufficient documentation

## 2022-06-28 DIAGNOSIS — M6281 Muscle weakness (generalized): Secondary | ICD-10-CM | POA: Diagnosis not present

## 2022-06-28 DIAGNOSIS — R2689 Other abnormalities of gait and mobility: Secondary | ICD-10-CM | POA: Diagnosis not present

## 2022-06-28 NOTE — Patient Instructions (Signed)

## 2022-06-28 NOTE — Addendum Note (Signed)
Addended by: Mearl Latin on: 06/28/2022 12:13 PM   Modules accepted: Orders

## 2022-06-28 NOTE — Therapy (Signed)
OUTPATIENT PHYSICAL THERAPY THORACOLUMBAR EVALUATION   Patient Name: Andre Lynch MRN: [id number] DOB:02-02-1981, 42 y.o., male Today's Date: 06/28/2022  END OF SESSION:  PT End of Session - 06/28/22 1121     Visit Number 1    Number of Visits 6    Date for PT Re-Evaluation 08/09/22    Authorization Type Medicaid [employer]    Authorization Time Period 6 visits requested - check auth    PT Start Time 1122    PT Stop Time 1201    PT Time Calculation (min) 39 min    Activity Tolerance Patient limited by pain;Patient tolerated treatment well    Behavior During Therapy [facility] for tasks assessed/performed             Past Medical History:  Diagnosis Date   Abdominal distension 10/14/2021   Anxiety    Back pain    Breast tenderness 10/14/2021   Chronic hepatitis C without hepatic coma ([place]) 09/17/2021   Chronic knee pain    Depression    Gastric ulcer    Hepatitis-C    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   Intertrigo 10/14/2021   IVDU (intravenous drug user) 09/17/2021   Kidney stones    Low testosterone 03/02/2022   Lower extremity edema 09/17/2021   Lumbago 10/14/2021   RLS (restless legs syndrome)    Seizures ([place])    last one early 2018   Substance abuse ([place])    cocaine, benzos, marijuana, benzos, opiates   Past Surgical History:  Procedure Laterality Date   HERNIA REPAIR     WISDOM TOOTH EXTRACTION     Patient Active Problem List   Diagnosis Date Noted   Need for influenza vaccination 06/22/2022   Iron deficiency 05/19/2022   Current smoker 04/09/2022   Hypogonadism, male 03/02/2022   ADHD 02/10/2022   Tobacco use 02/10/2022   Fatigue 02/10/2022   Preventative health care 02/10/2022   Chronic bilateral low back pain with sciatica 10/14/2021   Abdominal distension 10/14/2021   Breast tenderness 10/14/2021   Intertrigo 10/14/2021   Chronic hepatitis C without hepatic coma ([place]) 09/17/2021   IVDU (intravenous drug user) 09/17/2021   Lower  extremity edema 09/17/2021   Hepatitis C antibody positive in blood 06/23/2021   Localized edema 06/23/2021   High risk heterosexual behavior 12/08/2020   Insomnia 09/19/2020   History of manic depressive disorder 09/19/2020   History of intravenous drug abuse ([place]) 09/19/2020   Exposure to human immunodeficiency virus 09/19/2020   Anxiety 09/19/2020   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[place]) 06/27/2020   Mental health disorder    Acute encephalopathy 06/25/2020   Substance use disorder 06/25/2020   Chronic pain syndrome 06/25/2020   Benzodiazepine withdrawal without complication ([place])    Seizures ([place]) 06/24/2020   Cannabis abuse, episodic use 05/17/2020   Opioid use disorder 07/27/2018   Opioid dependence on agonist therapy ([place]) 07/27/2018   Cigarette nicotine dependence without complication 43/32/9518   H/O: substance abuse ([place]) 07/28/2012   GAD (generalized anxiety disorder) 07/28/2012    PCP: Marland Kitchen MD  REFERRING PROVIDER: Kary Kos, MD  REFERRING DIAG: [phone] (ICD-10-CM) - Spondylosis without myelopathy or radiculopathy, lumbar region  Rationale for Evaluation and Treatment: Rehabilitation  THERAPY DIAG:  Other low back pain  Muscle weakness (generalized)  Other abnormalities of gait and mobility  Other symptoms and signs involving the musculoskeletal system  ONSET DATE: Chronic  SUBJECTIVE:  SUBJECTIVE STATEMENT: Patient states he fell off ladder in 2020 and things have just got worse. States PT was helping a little bit when he was here at the end of 2023. He still does some of the home exercises. Twisting makes back worse.   PERTINENT HISTORY:  Waiting on disability,Low testosterone  PAIN:  Are you having pain? Yes: NPRS scale: 4/10 Pain location: low back Pain  description: achy Aggravating factors: twisting Relieving factors: sitting  PRECAUTIONS: None  WEIGHT BEARING RESTRICTIONS: No  FALLS:  Has patient fallen in last 6 months? No  LIVING ENVIRONMENT: Lives with: lives with their family Lives in: House/apartment Stairs: Yes: External: 2 steps; on right going up, on left going up, and can reach both Has following equipment at home: None  OCCUPATION: disabilty  PLOF: Independent  PATIENT GOALS: relieve some pain  NEXT MD VISIT: none scheduled  OBJECTIVE:   DIAGNOSTIC FINDINGS:  MRI 03/11/22 IMPRESSION: 1. L5-S1 left subarticular and foraminal disc protrusion, which may contact the descending left S1 nerve roots. 2. No spinal canal stenosis or neural foraminal narrowing. 3. Levoscoliosis of the lumbar spine  PATIENT SURVEYS: Modified Oswestry 28/50   SCREENING FOR RED FLAGS: Bowel or bladder incontinence: No Spinal tumors: No Cauda equina syndrome: No Compression fracture: No Abdominal aneurysm: No  COGNITION: Overall cognitive status: Within functional limits for tasks assessed     SENSATION: WFL   POSTURE: rounded shoulders and forward head  PALPATION: Grossly TTP lumbar paraspinals with greatest tenderness lower, increased L upper lumbar/thoracic paraspinals muscle mass and resting tone; hypomobile thoracic spine, hypomobile and painful lumbar spine  LUMBAR ROM:   AROM eval  Flexion 0% limited  Extension 25% limited *  Right lateral flexion 25% limited *  Left lateral flexion 25% limited  Right rotation   Left rotation    (Blank rows = not tested) *=pain  LOWER EXTREMITY ROM:   WFL for tasks assessed    LOWER EXTREMITY MMT:  slight shakiness with all MMT  MMT Right eval Left eval  Hip flexion 4+ 4+  Hip extension 4 4  Hip abduction 4+ 4+  Hip adduction    Hip internal rotation    Hip external rotation    Knee flexion 4+ 4+  Knee extension 4+ 4+  Ankle dorsiflexion 4+ 4+  Ankle  plantarflexion    Ankle inversion    Ankle eversion     (Blank rows = not tested)    FUNCTIONAL TESTS:  5 times sit to stand: 10.07  GAIT: Distance walked: 100 feet Assistive device utilized: None Level of assistance: Complete Independence Comments: WFL  TODAY'S TREATMENT:                                                                                                                              DATE:  06/28/22 Manual: STM to L lumbar paraspinals pre and post dry needling for trigger point identification and muscular relaxation.   Trigger Point  Dry-Needling  Treatment instructions: Expect mild to moderate muscle soreness. S/S of pneumothorax if dry needled over a lung field, and to seek immediate medical attention should they occur. Patient verbalized understanding of these instructions and education.  Patient Consent Given: Yes Education handout provided: Yes Muscles treated: L lower lumbar paraspinals Electrical stimulation performed: No Parameters: N/A Treatment response/outcome: decrease in tissue tension, decrease in symptoms    PATIENT EDUCATION:  Education details: Patient educated on exam findings, POC, scope of PT, HEP, and dry needling. Person educated: Patient Education method: Explanation, Demonstration, and Handouts Education comprehension: verbalized understanding, returned demonstration, verbal cues required, and tactile cues required  HOME EXERCISE PROGRAM: Continuing HEP from end of 2022  ASSESSMENT:  CLINICAL IMPRESSION: Patient a 42 y.o. y.o. male who was seen today for physical therapy evaluation and treatment for LBP. Patient presents with pain limited deficits in lumbar spine and LE strength, ROM, endurance, activity tolerance, and functional mobility with ADL. Patient is having to modify and restrict ADL as indicated by outcome measure score as well as subjective information and objective measures which is affecting overall participation. Patient will  benefit from skilled physical therapy in order to improve function and reduce impairment.  OBJECTIVE IMPAIRMENTS: decreased activity tolerance, decreased endurance, decreased mobility, difficulty walking, decreased ROM, decreased strength, hypomobility, increased muscle spasms, impaired flexibility, improper body mechanics, postural dysfunction, and pain.   ACTIVITY LIMITATIONS: carrying, lifting, bending, sitting, standing, squatting, stairs, transfers, reach over head, locomotion level, and caring for others  PARTICIPATION LIMITATIONS: meal prep, cleaning, laundry, shopping, community activity, occupation, and yard work  PERSONAL FACTORS: Time since onset of injury/illness/exacerbation are also affecting patient's functional outcome.   REHAB POTENTIAL: Good  CLINICAL DECISION MAKING: Stable/uncomplicated  EVALUATION COMPLEXITY: Low   GOALS: Goals reviewed with patient? Yes  SHORT TERM GOALS: Target date: 07/19/2022    Patient will be independent with HEP in order to improve functional outcomes. Baseline:  Goal status: INITIAL  2.  Patient will report at least 25% improvement in symptoms for improved quality of life. Baseline:  Goal status: INITIAL    LONG TERM GOALS: Target date: 08/09/2022    Patient will report at least 75% improvement in symptoms for improved quality of life. Baseline:  Goal status: INITIAL  2.  Patient will improve Oswestry score by at least 5 points in order to indicate improved tolerance to activity. Baseline: 28/50 Goal status: INITIAL  3.  Patient will demonstrate at least 25% improvement in lumbar ROM in all restricted planes for improved ability to move trunk while completing chores. Baseline: see above Goal status: INITIAL  4.  Patient will demonstrate grade of 5/5 MMT grade in all tested musculature as evidence of improved strength to assist with stair ambulation and gait.  Baseline: see above Goal status: INITIAL     PLAN:  PT  FREQUENCY: 1x/week  PT DURATION: 6 weeks  PLANNED INTERVENTIONS: Therapeutic exercises, Therapeutic activity, Neuromuscular re-education, Balance training, Gait training, Patient/Family education, Joint manipulation, Joint mobilization, Stair training, Orthotic/Fit training, DME instructions, Aquatic Therapy, Dry Needling, Electrical stimulation, Spinal manipulation, Spinal mobilization, Cryotherapy, Moist heat, Compression bandaging, scar mobilization, Splintting, Taping, Traction, Ultrasound, Ionotophoresis 4mg /ml Dexamethasone, and Manual therapy  PLAN FOR NEXT SESSION: f/u with dry needling, continue if needed; core and glute strength, lumbar and thoracic mobility   Mearl Latin, PT 06/28/2022, 12:11 PM

## 2022-06-30 DIAGNOSIS — F4312 Post-traumatic stress disorder, chronic: Secondary | ICD-10-CM | POA: Diagnosis not present

## 2022-06-30 DIAGNOSIS — F900 Attention-deficit hyperactivity disorder, predominantly inattentive type: Secondary | ICD-10-CM | POA: Diagnosis not present

## 2022-06-30 DIAGNOSIS — Z5181 Encounter for therapeutic drug level monitoring: Secondary | ICD-10-CM | POA: Diagnosis not present

## 2022-06-30 DIAGNOSIS — Z79899 Other long term (current) drug therapy: Secondary | ICD-10-CM | POA: Diagnosis not present

## 2022-06-30 DIAGNOSIS — F411 Generalized anxiety disorder: Secondary | ICD-10-CM | POA: Diagnosis not present

## 2022-06-30 DIAGNOSIS — F319 Bipolar disorder, unspecified: Secondary | ICD-10-CM | POA: Diagnosis not present

## 2022-07-01 DIAGNOSIS — Z419 Encounter for procedure for purposes other than remedying health state, unspecified: Secondary | ICD-10-CM | POA: Diagnosis not present

## 2022-07-06 ENCOUNTER — Other Ambulatory Visit: Payer: Self-pay | Admitting: Internal Medicine

## 2022-07-06 DIAGNOSIS — E291 Testicular hypofunction: Secondary | ICD-10-CM

## 2022-07-06 MED ORDER — "BD SYRINGE/NEEDLE 23G X 1"" 3 ML MISC": 1.0000 | 0 refills | Status: AC

## 2022-07-06 MED ORDER — "BD SAFETYGLIDE SHIELDED NEEDLE 21G X 1-1/2"" MISC": 1.0000 | 0 refills | Status: AC

## 2022-07-06 MED ORDER — TESTOSTERONE CYPIONATE 200 MG/ML IM SOLN: 200.0000 mg | INTRAMUSCULAR | 0 refills | Status: DC

## 2022-07-12 ENCOUNTER — Ambulatory Visit: Payer: 59 | Admitting: "Endocrinology

## 2022-07-19 ENCOUNTER — Encounter (HOSPITAL_COMMUNITY): Payer: Self-pay | Admitting: Physical Therapy

## 2022-07-19 ENCOUNTER — Ambulatory Visit (HOSPITAL_COMMUNITY): Payer: Medicaid Other | Attending: Orthopaedic Surgery | Admitting: Physical Therapy

## 2022-07-19 DIAGNOSIS — M5459 Other low back pain: Secondary | ICD-10-CM | POA: Diagnosis not present

## 2022-07-19 NOTE — Therapy (Signed)
OUTPATIENT PHYSICAL THERAPY THORACOLUMBAR EVALUATION   Patient Name: Andre Lynch MRN: [id number] DOB:[birth year], 42 y.o., male Today's Date: 07/19/2022  END OF SESSION:  PT End of Session - 07/19/22 0859     Visit Number 2    Number of Visits 6    Date for PT Re-Evaluation 08/09/22    Authorization Type Medicaid [employer]    Authorization Time Period 10 approved 1/29 to 08/27/22    Authorization - Visit Number 1    Authorization - Number of Visits 10    Progress Note Due on Visit 6    PT Start Time 0900    PT Stop Time 0940    PT Time Calculation (min) 40 min    Activity Tolerance Patient tolerated treatment well    Behavior During Therapy [facility] for tasks assessed/performed             Past Medical History:  Diagnosis Date   Abdominal distension 10/14/2021   Anxiety    Back pain    Breast tenderness 10/14/2021   Chronic hepatitis C without hepatic coma ([place]) 09/17/2021   Chronic knee pain    Depression    Gastric ulcer    Hepatitis-C    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   Intertrigo 10/14/2021   IVDU (intravenous drug user) 09/17/2021   Kidney stones    Low testosterone 03/02/2022   Lower extremity edema 09/17/2021   Lumbago 10/14/2021   RLS (restless legs syndrome)    Seizures (Home)    last one early 2018   Substance abuse ([place])    cocaine, benzos, marijuana, benzos, opiates   Past Surgical History:  Procedure Laterality Date   HERNIA REPAIR     WISDOM TOOTH EXTRACTION     Patient Active Problem List   Diagnosis Date Noted   Need for influenza vaccination 06/22/2022   Iron deficiency 05/19/2022   Current smoker 04/09/2022   Hypogonadism, male 03/02/2022   ADHD 02/10/2022   Tobacco use 02/10/2022   Fatigue 02/10/2022   Preventative health care 02/10/2022   Chronic bilateral low back pain with sciatica 10/14/2021   Abdominal distension 10/14/2021   Breast tenderness 10/14/2021   Intertrigo 10/14/2021   Chronic hepatitis C without  hepatic coma ([place]) 09/17/2021   IVDU (intravenous drug user) 09/17/2021   Lower extremity edema 09/17/2021   Hepatitis C antibody positive in blood 06/23/2021   Localized edema 06/23/2021   High risk heterosexual behavior 12/08/2020   Insomnia 09/19/2020   History of manic depressive disorder 09/19/2020   History of intravenous drug abuse ([place]) 09/19/2020   Exposure to human immunodeficiency virus 09/19/2020   Anxiety 09/19/2020   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[place]) 06/27/2020   Mental health disorder    Acute encephalopathy 06/25/2020   Substance use disorder 06/25/2020   Chronic pain syndrome 06/25/2020   Benzodiazepine withdrawal without complication ([place])    Seizures ([place]) 06/24/2020   Cannabis abuse, episodic use 05/17/2020   Opioid use disorder 07/27/2018   Opioid dependence on agonist therapy ([place]) 07/27/2018   Cigarette nicotine dependence without complication [id number]   H/O: substance abuse ([place]) 07/28/2012   GAD (generalized anxiety disorder) 07/28/2012    PCP: Marland Kitchen MD  REFERRING PROVIDER: Kary Kos, MD  REFERRING DIAG: [phone] (ICD-10-CM) - Spondylosis without myelopathy or radiculopathy, lumbar region  Rationale for Evaluation and Treatment: Rehabilitation  THERAPY DIAG:  Other low back pain  ONSET DATE: Chronic  SUBJECTIVE:  SUBJECTIVE STATEMENT: No noted effects from dry needling. Not much different. No bruising or soreness.   PERTINENT HISTORY:  Waiting on disability,Low testosterone  PAIN:  Are you having pain? Yes: NPRS scale: 4/10 Pain location: low back Pain description: achy Aggravating factors: twisting Relieving factors: sitting  PRECAUTIONS: None  WEIGHT BEARING RESTRICTIONS: No  FALLS:  Has patient fallen in last 6 months? No  LIVING  ENVIRONMENT: Lives with: lives with their family Lives in: House/apartment Stairs: Yes: External: 2 steps; on right going up, on left going up, and can reach both Has following equipment at home: None  OCCUPATION: disabilty  PLOF: Independent  PATIENT GOALS: relieve some pain  NEXT MD VISIT: none scheduled  OBJECTIVE:   DIAGNOSTIC FINDINGS:  MRI 03/11/22 IMPRESSION: 1. L5-S1 left subarticular and foraminal disc protrusion, which may contact the descending left S1 nerve roots. 2. No spinal canal stenosis or neural foraminal narrowing. 3. Levoscoliosis of the lumbar spine  PATIENT SURVEYS: Modified Oswestry 28/50   SCREENING FOR RED FLAGS: Bowel or bladder incontinence: No Spinal tumors: No Cauda equina syndrome: No Compression fracture: No Abdominal aneurysm: No  COGNITION: Overall cognitive status: Within functional limits for tasks assessed     SENSATION: WFL   POSTURE: rounded shoulders and forward head  PALPATION: Grossly TTP lumbar paraspinals with greatest tenderness lower, increased L upper lumbar/thoracic paraspinals muscle mass and resting tone; hypomobile thoracic spine, hypomobile and painful lumbar spine  LUMBAR ROM:   AROM eval  Flexion 0% limited  Extension 25% limited *  Right lateral flexion 25% limited *  Left lateral flexion 25% limited  Right rotation   Left rotation    (Blank rows = not tested) *=pain  LOWER EXTREMITY ROM:   WFL for tasks assessed    LOWER EXTREMITY MMT:  slight shakiness with all MMT  MMT Right eval Left eval  Hip flexion 4+ 4+  Hip extension 4 4  Hip abduction 4+ 4+  Hip adduction    Hip internal rotation    Hip external rotation    Knee flexion 4+ 4+  Knee extension 4+ 4+  Ankle dorsiflexion 4+ 4+  Ankle plantarflexion    Ankle inversion    Ankle eversion     (Blank rows = not tested)    FUNCTIONAL TESTS:  5 times sit to stand: 10.07  GAIT: Distance walked: 100 feet Assistive device utilized:  None Level of assistance: Complete Independence Comments: WFL  TODAY'S TREATMENT:                                                                                                                              DATE:  07/19/22 Ab brace 10 x 5" Ab march x 20 SLR 2 x 10 Bridge 2 x 10 with 5 sec holds (notes pain) LTR 5 x 10" (notes pain)  SKTC 5 x 10" Piriformis stretch 10 x 10"   Sidleying hip abduction 2 x 10   Manual STM  to bilat lumbar paraspinal pre and post dry needling for trigger point identification and surface area preparation   Trigger Point Dry-Needling  Treatment instructions: Expect mild to moderate muscle soreness. S/S of pneumothorax if dry needled over a lung field, and to seek immediate medical attention should they occur. Patient verbalized understanding of these instructions and education.  Patient Consent Given: Yes Education handout provided: Yes Muscles treated: Bilat lower lumbar paraspinals Electrical stimulation performed: No Parameters: N/A Treatment response/outcome: decrease in tissue tension, decrease in symptoms  06/28/22 Manual: STM to L lumbar paraspinals pre and post dry needling for trigger point identification and muscular relaxation.   Trigger Point Dry-Needling  Treatment instructions: Expect mild to moderate muscle soreness. S/S of pneumothorax if dry needled over a lung field, and to seek immediate medical attention should they occur. Patient verbalized understanding of these instructions and education.  Patient Consent Given: Yes Education handout provided: Yes Muscles treated: L lower lumbar paraspinals Electrical stimulation performed: No Parameters: N/A Treatment response/outcome: decrease in tissue tension, decrease in symptoms    PATIENT EDUCATION:  Education details: Patient educated on exam findings, POC, scope of PT, HEP, and dry needling. Person educated: Patient Education method: Explanation, Demonstration, and  Handouts Education comprehension: verbalized understanding, returned demonstration, verbal cues required, and tactile cues required  HOME EXERCISE PROGRAM: Continuing HEP from end of 2022  ASSESSMENT:  CLINICAL IMPRESSION: Somewhat pain limited with exercise. Able to complete most activity but with increased pain. Tolerated dry needling well. Patient remains limited by core and LE weakness which is likely contributing to ongoing pain. Patient will continue to benefit from skilled therapy services to reduce remaining deficits and improve functional ability.    OBJECTIVE IMPAIRMENTS: decreased activity tolerance, decreased endurance, decreased mobility, difficulty walking, decreased ROM, decreased strength, hypomobility, increased muscle spasms, impaired flexibility, improper body mechanics, postural dysfunction, and pain.   ACTIVITY LIMITATIONS: carrying, lifting, bending, sitting, standing, squatting, stairs, transfers, reach over head, locomotion level, and caring for others  PARTICIPATION LIMITATIONS: meal prep, cleaning, laundry, shopping, community activity, occupation, and yard work  PERSONAL FACTORS: Time since onset of injury/illness/exacerbation are also affecting patient's functional outcome.   REHAB POTENTIAL: Good  CLINICAL DECISION MAKING: Stable/uncomplicated  EVALUATION COMPLEXITY: Low   GOALS: Goals reviewed with patient? Yes  SHORT TERM GOALS: Target date: 07/19/2022    Patient will be independent with HEP in order to improve functional outcomes. Baseline:  Goal status: INITIAL  2.  Patient will report at least 25% improvement in symptoms for improved quality of life. Baseline:  Goal status: INITIAL    LONG TERM GOALS: Target date: 08/09/2022    Patient will report at least 75% improvement in symptoms for improved quality of life. Baseline:  Goal status: INITIAL  2.  Patient will improve Oswestry score by at least 5 points in order to indicate improved  tolerance to activity. Baseline: 28/50 Goal status: INITIAL  3.  Patient will demonstrate at least 25% improvement in lumbar ROM in all restricted planes for improved ability to move trunk while completing chores. Baseline: see above Goal status: INITIAL  4.  Patient will demonstrate grade of 5/5 MMT grade in all tested musculature as evidence of improved strength to assist with stair ambulation and gait.  Baseline: see above Goal status: INITIAL     PLAN:  PT FREQUENCY: 1x/week  PT DURATION: 6 weeks  PLANNED INTERVENTIONS: Therapeutic exercises, Therapeutic activity, Neuromuscular re-education, Balance training, Gait training, Patient/Family education, Joint manipulation, Joint mobilization, Stair training, Orthotic/Fit training,  DME instructions, Aquatic Therapy, Dry Needling, Electrical stimulation, Spinal manipulation, Spinal mobilization, Cryotherapy, Moist heat, Compression bandaging, scar mobilization, Splintting, Taping, Traction, Ultrasound, Ionotophoresis 54m/ml Dexamethasone, and Manual therapy  PLAN FOR NEXT SESSION: f/u with dry needling, continue if needed; core and glute strength, lumbar and thoracic mobility  9:41 AM, 07/19/22 CJosue HectorPT DPT  Physical Therapist with [facility] [phone]

## 2022-07-21 ENCOUNTER — Encounter: Payer: Self-pay | Admitting: Orthopaedic Surgery

## 2022-07-21 ENCOUNTER — Ambulatory Visit (INDEPENDENT_AMBULATORY_CARE_PROVIDER_SITE_OTHER): Payer: Medicaid Other | Admitting: Orthopaedic Surgery

## 2022-07-21 VITALS — BP 163/113 | HR 87 | Ht 69.0 in | Wt 194.6 lb

## 2022-07-21 DIAGNOSIS — G894 Chronic pain syndrome: Secondary | ICD-10-CM | POA: Diagnosis not present

## 2022-07-21 DIAGNOSIS — M5442 Lumbago with sciatica, left side: Secondary | ICD-10-CM | POA: Diagnosis not present

## 2022-07-21 DIAGNOSIS — G8929 Other chronic pain: Secondary | ICD-10-CM

## 2022-07-21 DIAGNOSIS — M5441 Lumbago with sciatica, right side: Secondary | ICD-10-CM | POA: Diagnosis not present

## 2022-07-21 MED ORDER — CYCLOBENZAPRINE HCL 10 MG PO TABS: 10.0000 mg | ORAL_TABLET | Freq: Every day | ORAL | 4 refills | Status: DC

## 2022-07-21 NOTE — Patient Instructions (Addendum)
 Medication Instructions:  Flexeril will be called in for you today Testing/Procedures: none Follow-Up:3 MTHS  If you need a refill on your  medications prescribed by Dr. Luna Glasgow before your next appointment, please call your pharmacy.  DR.KEELING'S SCHEDULE IS AS FOLLOWS: TUESDAY: ALL DAY WEDNESDAY: MORNING ONLY THURSDAY: MORNING ONLY PLEASE CALL OR SEND A MESSAGE VIA MYCHART BY WEDNESDAY MORNING SO THAT I CAN SEND A REQUEST TO HIM. HE LEAVES THURSDAY BY 11:30AM. HE DOES NOT RETURN TO THE OFFICE UNTIL TUESDAY MORNINGS AND HE DOES NOT CHECK HIS WORK MESSAGES DURING HIS TIME AWAY FROM THE OFFICE. I'M  AND IF YOU NEED SOMETHING, SEND ME A MYCHART MESSAGE OR CALL. MYCHART IS THE FASTEST WAY TO GET IN CONTACT WITH ME.

## 2022-07-21 NOTE — Progress Notes (Signed)
I still hurt.  He has lower back pain with radiation to the left side and sometimes to the right side.  He is not improving.  He has seen neurosurgeon who recommended PT.  He has been to PT and I have reviewed the notes.  He has had dry needling.  He is not improving.  He has no weakness, no trauma.  He had his disability claim denied again.  Lumbar spine is tender but no spasm, ROM is good, muscle tone and strength is normal, weakly positive SLR left at 30, gait normal.  Encounter Diagnoses  Name Primary?   Chronic bilateral low back pain with bilateral sciatica Yes   Chronic pain syndrome    Continue his medication.  The flexeril is helping and I will refill.  Continue his exercises.  He can stop PT if he desires  Return in three months.  Call if any problem.  Precautions discussed.  Electronically Signed Sanjuana Kava, MD 2/21/202410:37 AM

## 2022-07-26 ENCOUNTER — Encounter (HOSPITAL_COMMUNITY): Payer: Medicaid Other | Admitting: Physical Therapy

## 2022-07-28 DIAGNOSIS — F900 Attention-deficit hyperactivity disorder, predominantly inattentive type: Secondary | ICD-10-CM | POA: Diagnosis not present

## 2022-07-28 DIAGNOSIS — Z5181 Encounter for therapeutic drug level monitoring: Secondary | ICD-10-CM | POA: Diagnosis not present

## 2022-07-28 DIAGNOSIS — Z79899 Other long term (current) drug therapy: Secondary | ICD-10-CM | POA: Diagnosis not present

## 2022-07-28 DIAGNOSIS — F4312 Post-traumatic stress disorder, chronic: Secondary | ICD-10-CM | POA: Diagnosis not present

## 2022-07-28 DIAGNOSIS — F319 Bipolar disorder, unspecified: Secondary | ICD-10-CM | POA: Diagnosis not present

## 2022-07-28 DIAGNOSIS — F411 Generalized anxiety disorder: Secondary | ICD-10-CM | POA: Diagnosis not present

## 2022-07-29 ENCOUNTER — Encounter: Payer: Self-pay | Admitting: Radiology

## 2022-07-30 DIAGNOSIS — Z419 Encounter for procedure for purposes other than remedying health state, unspecified: Secondary | ICD-10-CM | POA: Diagnosis not present

## 2022-08-02 ENCOUNTER — Other Ambulatory Visit: Payer: Self-pay

## 2022-08-02 ENCOUNTER — Other Ambulatory Visit: Payer: Self-pay | Admitting: Internal Medicine

## 2022-08-02 DIAGNOSIS — E291 Testicular hypofunction: Secondary | ICD-10-CM

## 2022-08-02 MED ORDER — TESTOSTERONE CYPIONATE 200 MG/ML IM SOLN: 200.0000 mg | INTRAMUSCULAR | 0 refills | Status: DC

## 2022-08-03 ENCOUNTER — Encounter (HOSPITAL_COMMUNITY): Payer: Medicaid Other | Admitting: Physical Therapy

## 2022-08-03 ENCOUNTER — Other Ambulatory Visit: Payer: Self-pay

## 2022-08-03 DIAGNOSIS — E291 Testicular hypofunction: Secondary | ICD-10-CM

## 2022-08-03 MED ORDER — TESTOSTERONE CYPIONATE 200 MG/ML IM SOLN: 200.0000 mg | INTRAMUSCULAR | 0 refills | Status: DC

## 2022-08-11 ENCOUNTER — Encounter (HOSPITAL_COMMUNITY): Payer: Medicaid Other | Admitting: Physical Therapy

## 2022-08-13 ENCOUNTER — Ambulatory Visit: Payer: 59 | Admitting: Internal Medicine

## 2022-08-17 ENCOUNTER — Ambulatory Visit (INDEPENDENT_AMBULATORY_CARE_PROVIDER_SITE_OTHER): Payer: Medicaid Other | Admitting: Internal Medicine

## 2022-08-17 ENCOUNTER — Encounter: Payer: Self-pay | Admitting: Internal Medicine

## 2022-08-17 ENCOUNTER — Encounter (HOSPITAL_COMMUNITY): Payer: Medicaid Other | Admitting: Physical Therapy

## 2022-08-17 VITALS — BP 141/84 | HR 92 | Ht 69.0 in | Wt 194.0 lb

## 2022-08-17 DIAGNOSIS — E291 Testicular hypofunction: Secondary | ICD-10-CM | POA: Diagnosis not present

## 2022-08-17 DIAGNOSIS — E611 Iron deficiency: Secondary | ICD-10-CM | POA: Diagnosis not present

## 2022-08-17 DIAGNOSIS — J189 Pneumonia, unspecified organism: Secondary | ICD-10-CM

## 2022-08-17 DIAGNOSIS — D225 Melanocytic nevi of trunk: Secondary | ICD-10-CM

## 2022-08-17 MED ORDER — AZITHROMYCIN 250 MG PO TABS: ORAL_TABLET | ORAL | 0 refills | Status: DC

## 2022-08-17 NOTE — Assessment & Plan Note (Signed)
Nevus present in middle of back with various pigmentation and irregular border. -Dermatology referral placed for further evaluation

## 2022-08-17 NOTE — Progress Notes (Signed)
Established Patient Office Visit  Subjective   Patient ID: Andre Lynch, male    DOB: 1980/09/11  Age: 42 y.o. MRN: [id number]  Chief Complaint  Patient presents with   iron def    Follow up   Sinus Problem   Mr. Andre Lynch returns to care today for routine follow-up.  He was last evaluated by me on 1/23 for an acute visit in the setting of intertrigo and localized edema.  In the interim he has attended physical therapy and been seen by orthopedic surgery for follow-up.  There have otherwise been no acute interval events.  Mr. Andre Lynch reports feeling poorly today.  He endorses symptoms of sinus congestion and a cough productive of dark gray/green sputum.  He endorses subjective fever/chills.  He is unaware of any recent sick contacts.  He has been taking over-the-counter cough/cold medication without significant symptom relief.  His additional concern is a mole located on his back that he would like for me to evaluate.  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   Intertrigo 10/14/2021   IVDU (intravenous drug user) 09/17/2021   Kidney stones    Low testosterone 03/02/2022   Lower extremity edema 09/17/2021   Lumbago 10/14/2021   RLS (restless legs syndrome)    Seizures ([place])    last one early 2018   Substance abuse ([facility])    cocaine, benzos, marijuana, benzos, opiates   Past Surgical History:  Procedure Laterality Date   HERNIA REPAIR     WISDOM TOOTH EXTRACTION     Social History   Tobacco Use   Smoking status: Every Day    Packs/day: 0.50    Years: 20.00    Additional pack years: 0.00    Total pack years: 10.00    Types: Cigarettes   Smokeless tobacco: Never  Vaping Use   Vaping Use: Some days  Substance Use  Topics   Alcohol use: Not Currently   Drug use: Not Currently   Family History  Problem Relation Age of Onset   Thyroid disease Mother    Cancer Mother        breast cancer survivor   Hypertension Father    Allergies  Allergen Reactions   Ibuprofen Other (See Comments)    Cannot take due to stomach ulcers   Tylenol [Acetaminophen] Other (See Comments)    Cannot take due to stomach ulcers   Review of Systems  Constitutional:  Positive for chills and malaise/fatigue. Negative for fever.  HENT:  Positive for congestion and sinus pain. Negative for sore throat.   Respiratory:  Positive for cough and sputum production. Negative for hemoptysis, shortness of breath and wheezing.   Cardiovascular:  Negative for chest pain, palpitations and leg swelling.  Gastrointestinal:  Negative for abdominal pain, blood in stool, constipation, diarrhea, nausea and vomiting.  Genitourinary:  Negative for dysuria and hematuria.  Musculoskeletal:  Negative for myalgias.  Skin:  Negative for itching and rash.  Neurological:  Negative for dizziness and headaches.  Psychiatric/Behavioral:  Negative for depression and suicidal ideas.       Objective:     BP (!) 141/84   Pulse 92  Ht 5\' 9"  (1.753 m)   Wt 194 lb (88 kg)   SpO2 93%   BMI 28.65 kg/m  BP Readings from Last 3 Encounters:  08/17/22 (!) 141/84  07/21/22 (!) 163/113  06/22/22 (!) 135/90   Physical Exam Vitals reviewed.  Constitutional:      General: He is not in acute distress.    Appearance: Normal appearance. He is not ill-appearing.  HENT:     Head: Normocephalic and atraumatic.     Right Ear: External ear normal.     Left Ear: External ear normal.     Nose: Congestion present. No rhinorrhea.     Mouth/Throat:     Mouth: Mucous membranes are moist.     Pharynx: Oropharynx is clear. No oropharyngeal exudate or posterior oropharyngeal erythema.  Eyes:     General: No scleral icterus.    Extraocular Movements: Extraocular  movements intact.     Conjunctiva/sclera: Conjunctivae normal.     Pupils: Pupils are equal, round, and reactive to light.  Cardiovascular:     Rate and Rhythm: Normal rate and regular rhythm.     Pulses: Normal pulses.     Heart sounds: Normal heart sounds. No murmur heard. Pulmonary:     Effort: Pulmonary effort is normal.     Breath sounds: Rhonchi present. No wheezing or rales.  Abdominal:     General: Abdomen is flat. Bowel sounds are normal. There is no distension.     Palpations: Abdomen is soft.     Tenderness: There is no abdominal tenderness.  Musculoskeletal:        General: No swelling or deformity. Normal range of motion.     Cervical back: Normal range of motion.  Skin:    General: Skin is warm and dry.     Capillary Refill: Capillary refill takes less than 2 seconds.     Findings: Lesion (Multi pigmented nevus with irregular shape present on back in the thoracic region.) present.  Neurological:     General: No focal deficit present.     Mental Status: He is alert and oriented to person, place, and time.     Motor: No weakness.  Psychiatric:        Mood and Affect: Mood normal.        Behavior: Behavior normal.        Thought Content: Thought content normal.   Last CBC Lab Results  Component Value Date   WBC 6.6 05/11/2022   HGB 14.9 05/11/2022   HCT 42.6 05/11/2022   MCV 94 05/11/2022   MCH 33.0 05/11/2022   RDW 11.6 05/11/2022   PLT 307 [id number]   Last metabolic panel Lab Results  Component Value Date   GLUCOSE 87 03/02/2022   NA 139 03/02/2022   K 4.0 03/02/2022   CL 105 03/02/2022   CO2 26 03/02/2022   BUN 11 03/02/2022   CREATININE 0.93 03/02/2022   EGFR 105 03/02/2022   CALCIUM 9.4 03/02/2022   PHOS 3.2 06/25/2020   PROT 6.7 03/02/2022   ALBUMIN 4.4 02/09/2022   LABGLOB 2.4 02/09/2022   AGRATIO 1.8 02/09/2022   BILITOT 0.3 03/02/2022   ALKPHOS 62 02/09/2022   AST 13 03/02/2022   ALT 9 03/02/2022   ANIONGAP 9 06/23/2021   Last  lipids Lab Results  Component Value Date   CHOL 175 02/09/2022   HDL 52 02/09/2022   LDLCALC 103 (H) 02/09/2022   TRIG 114 02/09/2022   CHOLHDL 3.4 02/09/2022   Last hemoglobin  A1c Lab Results  Component Value Date   HGBA1C 5.7 (H) 02/09/2022   Last thyroid functions Lab Results  Component Value Date   TSH 1.280 03/15/2022   The 10-year ASCVD risk score (Arnett DK, et al., 2019) is: 3.8%    Assessment & Plan:   Problem List Items Addressed This Visit       CAP (community acquired pneumonia) - Primary    He endorses a cough productive of dark gray/green sputum that has been present since Saturday (3/16).  He additionally endorses subjective fever/chills.  His symptoms have not improved with use of OTC cough/cold medications. 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  He is currently prescribed testosterone cypionate 200 mg biweekly injections.  He continues to endorse symptomatic improvement since initiation of testosterone therapy. -Repeat free/total testosterone levels ordered today      Nevus of back    Nevus present in middle of back with various pigmentation and irregular border. -Dermatology referral placed for further evaluation      Iron deficiency    Noted on recent labs.  Oral iron supplementation recommended.  States that he took iron supplement for a period of time but is currently off supplementation. -Repeat iron studies ordered today       Return in about 6 months (around 02/17/2023).    Johnette Abraham, MD

## 2022-08-17 NOTE — Assessment & Plan Note (Signed)
Noted on recent labs.  Oral iron supplementation recommended.  States that he took iron supplement for a period of time but is currently off supplementation. -Repeat iron studies ordered today

## 2022-08-17 NOTE — Assessment & Plan Note (Signed)
He endorses a cough productive of dark gray/green sputum that has been present since Saturday (3/16).  He additionally endorses subjective fever/chills.  His symptoms have not improved with use of OTC cough/cold medications. -Z-Pak prescribed today

## 2022-08-17 NOTE — Patient Instructions (Signed)
It was a pleasure to see you today.  Thank you for giving [place] the opportunity to be involved in your care.  Below is a brief recap of your visit and next steps.  We will plan to see you again in 6 months.  Summary Zpak prescribed today Repeat labs ordered Dermatology referral placed Follow up in 6 months

## 2022-08-17 NOTE — Assessment & Plan Note (Signed)
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  He is currently prescribed testosterone cypionate 200 mg biweekly injections.  He continues to endorse symptomatic improvement since initiation of testosterone therapy. -Repeat free/total testosterone levels ordered today

## 2022-08-18 ENCOUNTER — Other Ambulatory Visit: Payer: Self-pay | Admitting: Internal Medicine

## 2022-08-18 DIAGNOSIS — E291 Testicular hypofunction: Secondary | ICD-10-CM

## 2022-08-18 LAB — CBC WITH DIFFERENTIAL/PLATELET
Basophils Absolute: 0.1 10*3/uL (ref 0.0–0.2)
Basos: 1 %
EOS (ABSOLUTE): 0.2 10*3/uL (ref 0.0–0.4)
Eos: 1 %
Hematocrit: 45.5 % (ref 37.5–51.0)
Hemoglobin: 15.1 g/dL (ref 13.0–17.7)
Immature Grans (Abs): 0 10*3/uL (ref 0.0–0.1)
Immature Granulocytes: 0 %
Lymphocytes Absolute: 2.6 10*3/uL (ref 0.7–3.1)
Lymphs: 23 %
MCH: 31.2 pg (ref 26.6–33.0)
MCHC: 33.2 g/dL (ref 31.5–35.7)
MCV: 94 fL (ref 79–97)
Monocytes Absolute: 0.8 10*3/uL (ref 0.1–0.9)
Monocytes: 8 %
Neutrophils Absolute: 7.3 10*3/uL — ABNORMAL HIGH (ref 1.4–7.0)
Neutrophils: 67 %
Platelets: 419 10*3/uL (ref 150–450)
RBC: 4.84 x10E6/uL (ref 4.14–5.80)
RDW: 13.7 % (ref 11.6–15.4)
WBC: 10.9 10*3/uL — ABNORMAL HIGH (ref 3.4–10.8)

## 2022-08-18 LAB — IRON,TIBC AND FERRITIN PANEL
Ferritin: 86 ng/mL (ref 30–400)
Iron Saturation: 11 % — ABNORMAL LOW (ref 15–55)
Iron: 37 ug/dL — ABNORMAL LOW (ref 38–169)
Total Iron Binding Capacity: 337 ug/dL (ref 250–450)
UIBC: 300 ug/dL (ref 111–343)

## 2022-08-18 LAB — TESTOSTERONE,FREE AND TOTAL
Testosterone, Free: >50 pg/mL — ABNORMAL HIGH (ref 6.8–21.5)
Testosterone: >1500 ng/dL — ABNORMAL HIGH (ref 264–916)

## 2022-08-19 ENCOUNTER — Encounter: Payer: Self-pay | Admitting: Internal Medicine

## 2022-08-24 ENCOUNTER — Encounter (HOSPITAL_COMMUNITY): Payer: Medicaid Other | Admitting: Physical Therapy

## 2022-08-25 DIAGNOSIS — F4312 Post-traumatic stress disorder, chronic: Secondary | ICD-10-CM | POA: Diagnosis not present

## 2022-08-25 DIAGNOSIS — F900 Attention-deficit hyperactivity disorder, predominantly inattentive type: Secondary | ICD-10-CM | POA: Diagnosis not present

## 2022-08-25 DIAGNOSIS — F411 Generalized anxiety disorder: Secondary | ICD-10-CM | POA: Diagnosis not present

## 2022-08-25 DIAGNOSIS — Z5181 Encounter for therapeutic drug level monitoring: Secondary | ICD-10-CM | POA: Diagnosis not present

## 2022-08-25 DIAGNOSIS — F319 Bipolar disorder, unspecified: Secondary | ICD-10-CM | POA: Diagnosis not present

## 2022-08-30 ENCOUNTER — Encounter: Payer: Self-pay | Admitting: Internal Medicine

## 2022-08-30 DIAGNOSIS — Z419 Encounter for procedure for purposes other than remedying health state, unspecified: Secondary | ICD-10-CM | POA: Diagnosis not present

## 2022-08-30 DIAGNOSIS — L304 Erythema intertrigo: Secondary | ICD-10-CM

## 2022-08-30 MED ORDER — FLUCONAZOLE 100 MG PO TABS: 100.0000 mg | ORAL_TABLET | Freq: Every day | ORAL | 0 refills | Status: AC

## 2022-09-02 DIAGNOSIS — M4126 Other idiopathic scoliosis, lumbar region: Secondary | ICD-10-CM | POA: Diagnosis not present

## 2022-09-22 DIAGNOSIS — F319 Bipolar disorder, unspecified: Secondary | ICD-10-CM | POA: Diagnosis not present

## 2022-09-22 DIAGNOSIS — Z5181 Encounter for therapeutic drug level monitoring: Secondary | ICD-10-CM | POA: Diagnosis not present

## 2022-09-29 DIAGNOSIS — Z419 Encounter for procedure for purposes other than remedying health state, unspecified: Secondary | ICD-10-CM | POA: Diagnosis not present

## 2022-10-18 ENCOUNTER — Encounter: Payer: Self-pay | Admitting: Orthopaedic Surgery

## 2022-10-19 ENCOUNTER — Ambulatory Visit: Payer: Medicaid Other | Admitting: Orthopaedic Surgery

## 2022-10-19 ENCOUNTER — Telehealth: Payer: Self-pay | Admitting: Radiology

## 2022-10-19 DIAGNOSIS — G8929 Other chronic pain: Secondary | ICD-10-CM

## 2022-10-19 NOTE — Telephone Encounter (Signed)
Per MyChart message- please advise on a referral?   From:Ladarian CORRIN HUBBART      Sent:10/18/2022  7:44 PM EDT        ZO:XWRUE Hilda Lias, MD   Subject:Tuesday appointment   Something has come up I'm not going to be able to make my appointment tomorrow morning because I don't have a ride I was just wanting to see if I could get a referral to [facility] in [facility] Dr Wynetta Emery at [facility] surgery has not been able to get my shots for me.

## 2022-10-20 ENCOUNTER — Encounter: Payer: Self-pay | Admitting: Radiology

## 2022-10-20 DIAGNOSIS — F900 Attention-deficit hyperactivity disorder, predominantly inattentive type: Secondary | ICD-10-CM | POA: Diagnosis not present

## 2022-10-20 DIAGNOSIS — F319 Bipolar disorder, unspecified: Secondary | ICD-10-CM | POA: Diagnosis not present

## 2022-10-20 DIAGNOSIS — F411 Generalized anxiety disorder: Secondary | ICD-10-CM | POA: Diagnosis not present

## 2022-10-20 DIAGNOSIS — Z5181 Encounter for therapeutic drug level monitoring: Secondary | ICD-10-CM | POA: Diagnosis not present

## 2022-10-20 DIAGNOSIS — F4312 Post-traumatic stress disorder, chronic: Secondary | ICD-10-CM | POA: Diagnosis not present

## 2022-10-20 NOTE — Telephone Encounter (Signed)
Referral placed, patient advised.

## 2022-10-20 NOTE — Addendum Note (Signed)
Addended by: Signa Kell on: 10/20/2022 02:35 PM   Modules accepted: Orders

## 2022-10-30 DIAGNOSIS — Z419 Encounter for procedure for purposes other than remedying health state, unspecified: Secondary | ICD-10-CM | POA: Diagnosis not present

## 2022-11-17 DIAGNOSIS — F4312 Post-traumatic stress disorder, chronic: Secondary | ICD-10-CM | POA: Diagnosis not present

## 2022-11-17 DIAGNOSIS — F411 Generalized anxiety disorder: Secondary | ICD-10-CM | POA: Diagnosis not present

## 2022-11-17 DIAGNOSIS — F900 Attention-deficit hyperactivity disorder, predominantly inattentive type: Secondary | ICD-10-CM | POA: Diagnosis not present

## 2022-11-17 DIAGNOSIS — Z5181 Encounter for therapeutic drug level monitoring: Secondary | ICD-10-CM | POA: Diagnosis not present

## 2022-11-17 DIAGNOSIS — F319 Bipolar disorder, unspecified: Secondary | ICD-10-CM | POA: Diagnosis not present

## 2022-11-29 DIAGNOSIS — Z419 Encounter for procedure for purposes other than remedying health state, unspecified: Secondary | ICD-10-CM | POA: Diagnosis not present

## 2022-12-15 DIAGNOSIS — F319 Bipolar disorder, unspecified: Secondary | ICD-10-CM | POA: Diagnosis not present

## 2022-12-15 DIAGNOSIS — F4312 Post-traumatic stress disorder, chronic: Secondary | ICD-10-CM | POA: Diagnosis not present

## 2022-12-15 DIAGNOSIS — Z5181 Encounter for therapeutic drug level monitoring: Secondary | ICD-10-CM | POA: Diagnosis not present

## 2022-12-15 DIAGNOSIS — F411 Generalized anxiety disorder: Secondary | ICD-10-CM | POA: Diagnosis not present

## 2022-12-15 DIAGNOSIS — F900 Attention-deficit hyperactivity disorder, predominantly inattentive type: Secondary | ICD-10-CM | POA: Diagnosis not present

## 2022-12-30 DIAGNOSIS — Z419 Encounter for procedure for purposes other than remedying health state, unspecified: Secondary | ICD-10-CM | POA: Diagnosis not present

## 2023-01-13 DIAGNOSIS — Z5181 Encounter for therapeutic drug level monitoring: Secondary | ICD-10-CM | POA: Diagnosis not present

## 2023-01-13 DIAGNOSIS — F411 Generalized anxiety disorder: Secondary | ICD-10-CM | POA: Diagnosis not present

## 2023-01-13 DIAGNOSIS — F900 Attention-deficit hyperactivity disorder, predominantly inattentive type: Secondary | ICD-10-CM | POA: Diagnosis not present

## 2023-01-13 DIAGNOSIS — F4312 Post-traumatic stress disorder, chronic: Secondary | ICD-10-CM | POA: Diagnosis not present

## 2023-01-13 DIAGNOSIS — F319 Bipolar disorder, unspecified: Secondary | ICD-10-CM | POA: Diagnosis not present

## 2023-01-13 DIAGNOSIS — Z79899 Other long term (current) drug therapy: Secondary | ICD-10-CM | POA: Diagnosis not present

## 2023-01-30 DIAGNOSIS — Z419 Encounter for procedure for purposes other than remedying health state, unspecified: Secondary | ICD-10-CM | POA: Diagnosis not present

## 2023-02-08 ENCOUNTER — Encounter: Payer: Self-pay | Admitting: Orthopaedic Surgery

## 2023-02-08 DIAGNOSIS — G8929 Other chronic pain: Secondary | ICD-10-CM

## 2023-02-08 DIAGNOSIS — F319 Bipolar disorder, unspecified: Secondary | ICD-10-CM | POA: Diagnosis not present

## 2023-02-08 DIAGNOSIS — F4312 Post-traumatic stress disorder, chronic: Secondary | ICD-10-CM | POA: Diagnosis not present

## 2023-02-08 DIAGNOSIS — Z5181 Encounter for therapeutic drug level monitoring: Secondary | ICD-10-CM | POA: Diagnosis not present

## 2023-02-08 DIAGNOSIS — F900 Attention-deficit hyperactivity disorder, predominantly inattentive type: Secondary | ICD-10-CM | POA: Diagnosis not present

## 2023-02-08 DIAGNOSIS — F411 Generalized anxiety disorder: Secondary | ICD-10-CM | POA: Diagnosis not present

## 2023-02-16 ENCOUNTER — Other Ambulatory Visit: Payer: Self-pay | Admitting: Physical Medicine and Rehabilitation

## 2023-02-18 ENCOUNTER — Ambulatory Visit: Payer: Medicaid Other | Admitting: Internal Medicine

## 2023-03-01 DIAGNOSIS — Z419 Encounter for procedure for purposes other than remedying health state, unspecified: Secondary | ICD-10-CM | POA: Diagnosis not present

## 2023-03-10 DIAGNOSIS — F319 Bipolar disorder, unspecified: Secondary | ICD-10-CM | POA: Diagnosis not present

## 2023-03-10 DIAGNOSIS — B182 Chronic viral hepatitis C: Secondary | ICD-10-CM | POA: Diagnosis not present

## 2023-03-10 DIAGNOSIS — Z79899 Other long term (current) drug therapy: Secondary | ICD-10-CM | POA: Diagnosis not present

## 2023-03-10 DIAGNOSIS — Z5181 Encounter for therapeutic drug level monitoring: Secondary | ICD-10-CM | POA: Diagnosis not present

## 2023-03-10 DIAGNOSIS — F411 Generalized anxiety disorder: Secondary | ICD-10-CM | POA: Diagnosis not present

## 2023-03-10 DIAGNOSIS — F900 Attention-deficit hyperactivity disorder, predominantly inattentive type: Secondary | ICD-10-CM | POA: Diagnosis not present

## 2023-03-10 DIAGNOSIS — F4312 Post-traumatic stress disorder, chronic: Secondary | ICD-10-CM | POA: Diagnosis not present

## 2023-03-23 ENCOUNTER — Emergency Department (HOSPITAL_COMMUNITY)
Admission: EM | Admit: 2023-03-23 | Discharge: 2023-03-23 | Disposition: A | Discharge status: Home / Self Care | Payer: Medicaid Other | Attending: Emergency Medicine | Admitting: Emergency Medicine

## 2023-03-23 ENCOUNTER — Other Ambulatory Visit: Payer: Self-pay

## 2023-03-23 ENCOUNTER — Encounter (HOSPITAL_COMMUNITY): Payer: Self-pay | Admitting: *Deleted

## 2023-03-23 DIAGNOSIS — L0291 Cutaneous abscess, unspecified: Secondary | ICD-10-CM

## 2023-03-23 DIAGNOSIS — L02211 Cutaneous abscess of abdominal wall: Secondary | ICD-10-CM | POA: Diagnosis not present

## 2023-03-23 DIAGNOSIS — L02214 Cutaneous abscess of groin: Secondary | ICD-10-CM | POA: Diagnosis not present

## 2023-03-23 NOTE — ED Triage Notes (Signed)
Pt has abscess on abdomen that started x 4 days ago  Pt states he has noticed some red/white drainage from the area

## 2023-03-23 NOTE — Discharge Instructions (Signed)
Keep area of abscess clean and dry.  Return to the emergency department for any new or worsening symptoms of concern.

## 2023-03-23 NOTE — ED Provider Notes (Signed)
[place] EMERGENCY DEPARTMENT AT [facility] Provider Note   CSN: [id number] Arrival date & time: 03/23/23  4098     History  Chief Complaint  Patient presents with   Abscess    Andre Lynch is a 42 y.o. male.   Abscess Patient presents for abscess.  Medical history includes substance use, seizures, anxiety, depression, PVD, nephrolithiasis, RLS.  He noticed an abscess form on his abdomen 4 days ago.  He has had some recent purulent drainage from the area.  He denies any systemic symptoms.  He denies any other recent skin findings.     Home Medications Prior to Admission medications   Medication Sig Start Date End Date Taking? Authorizing Provider  azithromycin (ZITHROMAX Z-PAK) 250 MG tablet Take 2 tablets (500 mg) PO today, then 1 tablet (250 mg) PO daily x4 days. 08/17/22   Billie Lade, MD  buprenorphine-naloxone (SUBOXONE) 8-2 mg SUBL SL tablet Place 1 tablet under the tongue 2 (two) times daily. 02/28/22   [provider]  CAPLYTA 42 MG capsule SMARTSIG:1 Capsule(s) By Mouth Every Evening 01/10/22   [provider]  clonazePAM (KLONOPIN) 0.5 MG tablet Take 0.5 mg by mouth 2 (two) times daily as needed. 03/15/22   [provider]  cyclobenzaprine (FLEXERIL) 10 MG tablet Take 1 tablet (10 mg total) by mouth at bedtime. One tablet every night at bedtime as needed for spasm. 07/21/22   Darreld Mclean, MD  gabapentin (NEURONTIN) 300 MG capsule Take by mouth 2 (two) times daily. 03/12/22   [provider]  Iron, Ferrous Sulfate, 325 (65 Fe) MG TABS Take 325 mg by mouth daily. 05/13/22   Billie Lade, MD  methylphenidate 54 MG PO CR tablet Take 54 mg by mouth every morning. 01/30/22   [provider]  NEEDLE, DISP, 21 G (BD SAFETYGLIDE SHIELDED NEEDLE) 21G X 1-1/2" MISC 1 Needle by Does not apply route every 14 (fourteen) days. 07/06/22   Billie Lade, MD  SYRINGE-NEEDLE, DISP, 3 ML (B-D SYRINGE/NEEDLE 3CC/23GX1") 23G X  1" 3 ML MISC 1 each by Does not apply route every 14 (fourteen) days. 07/06/22   Billie Lade, MD  testosterone cypionate (DEPOTESTOSTERONE CYPIONATE) 200 MG/ML injection Inject 1 mL (200 mg total) into the muscle every 14 (fourteen) days for 28 days. 08/03/22 08/31/22  Billie Lade, MD      Allergies    Ibuprofen and Tylenol [acetaminophen]    Review of Systems   Review of Systems  Skin:        Right lower abdomen abscess  All other systems reviewed and are negative.   Physical Exam Updated Vital Signs BP (!) 128/91   Pulse 77   Temp (!) 97.4 F (36.3 C) (Oral)   Ht 5\' 9"  (1.753 m)   Wt 83.9 kg   SpO2 97%   BMI 27.32 kg/m  Physical Exam Vitals and nursing note reviewed.  Constitutional:      General: He is not in acute distress.    Appearance: Normal appearance. He is well-developed. He is not ill-appearing, toxic-appearing or diaphoretic.  HENT:     Head: Normocephalic and atraumatic.     Right Ear: External ear normal.     Left Ear: External ear normal.     Nose: Nose normal.     Mouth/Throat:     Mouth: Mucous membranes are moist.  Eyes:     Extraocular Movements: Extraocular movements intact.     Conjunctiva/sclera: Conjunctivae normal.  Cardiovascular:     Rate and Rhythm: Normal rate and regular rhythm.  Pulmonary:     Effort: Pulmonary effort is normal. No respiratory distress.  Abdominal:     General: There is no distension.     Palpations: Abdomen is soft.     Tenderness: There is no abdominal tenderness.  Musculoskeletal:        General: No swelling. Normal range of motion.     Cervical back: Normal range of motion and neck supple.  Skin:    General: Skin is warm and dry.     Coloration: Skin is not jaundiced or pale.     Comments: Small area of induration, tenderness, and erythema on lower aspect of right abdomen.  No active drainage, no areas of fluctuance.  No drainable collection on bedside ultrasound.  Neurological:     General: No focal  deficit present.     Mental Status: He is alert and oriented to person, place, and time.  Psychiatric:        Mood and Affect: Mood normal.        Behavior: Behavior normal.     ED Results / Procedures / Treatments   Labs (all labs ordered are listed, but only abnormal results are displayed) Labs Reviewed - No data to display  EKG None  Radiology No results found.  Procedures Procedures    Medications Ordered in ED Medications - No data to display  ED Course/ Medical Decision Making/ A&P                                 Medical Decision Making  Patient presents for an abscess on lower aspect of abdomen.  He does describe some reason purulent drainage from the area.  He denies any systemic symptoms.  On arrival, patient is well-appearing.  Vital signs are normal.  Area of concern is indurated, mildly tender, mildly erythematous.  There is no surrounding erythematous spread.  On bedside ultrasound, there is no drainable fluid collection.  It does seem that abscess has already drained on its own.  Given absence of spread, I do not feel the patient requires antibiotics at this time.  A shared decision-making discussion was had regarding this.  He does agree.  Patient was discharged in good condition.        Final Clinical Impression(s) / ED Diagnoses Final diagnoses:  Abscess    Rx / DC Orders ED Discharge Orders     None         Gloris Manchester, MD 03/23/23 [phone]

## 2023-04-01 DIAGNOSIS — Z419 Encounter for procedure for purposes other than remedying health state, unspecified: Secondary | ICD-10-CM | POA: Diagnosis not present

## 2023-04-05 DIAGNOSIS — F4312 Post-traumatic stress disorder, chronic: Secondary | ICD-10-CM | POA: Diagnosis not present

## 2023-04-05 DIAGNOSIS — B182 Chronic viral hepatitis C: Secondary | ICD-10-CM | POA: Diagnosis not present

## 2023-04-05 DIAGNOSIS — F900 Attention-deficit hyperactivity disorder, predominantly inattentive type: Secondary | ICD-10-CM | POA: Diagnosis not present

## 2023-04-05 DIAGNOSIS — F319 Bipolar disorder, unspecified: Secondary | ICD-10-CM | POA: Diagnosis not present

## 2023-04-05 DIAGNOSIS — Z5181 Encounter for therapeutic drug level monitoring: Secondary | ICD-10-CM | POA: Diagnosis not present

## 2023-04-05 DIAGNOSIS — F411 Generalized anxiety disorder: Secondary | ICD-10-CM | POA: Diagnosis not present

## 2023-05-01 DIAGNOSIS — Z419 Encounter for procedure for purposes other than remedying health state, unspecified: Secondary | ICD-10-CM | POA: Diagnosis not present

## 2023-05-04 DIAGNOSIS — Z5181 Encounter for therapeutic drug level monitoring: Secondary | ICD-10-CM | POA: Diagnosis not present

## 2023-05-04 DIAGNOSIS — F319 Bipolar disorder, unspecified: Secondary | ICD-10-CM | POA: Diagnosis not present

## 2023-05-04 DIAGNOSIS — B182 Chronic viral hepatitis C: Secondary | ICD-10-CM | POA: Diagnosis not present

## 2023-05-04 DIAGNOSIS — F411 Generalized anxiety disorder: Secondary | ICD-10-CM | POA: Diagnosis not present

## 2023-05-04 DIAGNOSIS — F4312 Post-traumatic stress disorder, chronic: Secondary | ICD-10-CM | POA: Diagnosis not present

## 2023-05-04 DIAGNOSIS — F900 Attention-deficit hyperactivity disorder, predominantly inattentive type: Secondary | ICD-10-CM | POA: Diagnosis not present

## 2023-05-31 DIAGNOSIS — B182 Chronic viral hepatitis C: Secondary | ICD-10-CM | POA: Diagnosis not present

## 2023-05-31 DIAGNOSIS — F900 Attention-deficit hyperactivity disorder, predominantly inattentive type: Secondary | ICD-10-CM | POA: Diagnosis not present

## 2023-05-31 DIAGNOSIS — F411 Generalized anxiety disorder: Secondary | ICD-10-CM | POA: Diagnosis not present

## 2023-05-31 DIAGNOSIS — F319 Bipolar disorder, unspecified: Secondary | ICD-10-CM | POA: Diagnosis not present

## 2023-05-31 DIAGNOSIS — F4312 Post-traumatic stress disorder, chronic: Secondary | ICD-10-CM | POA: Diagnosis not present

## 2023-05-31 DIAGNOSIS — Z5181 Encounter for therapeutic drug level monitoring: Secondary | ICD-10-CM | POA: Diagnosis not present

## 2023-06-01 DIAGNOSIS — Z419 Encounter for procedure for purposes other than remedying health state, unspecified: Secondary | ICD-10-CM | POA: Diagnosis not present

## 2023-06-24 ENCOUNTER — Telehealth (INDEPENDENT_AMBULATORY_CARE_PROVIDER_SITE_OTHER): Payer: Medicaid Other | Admitting: Internal Medicine

## 2023-06-24 ENCOUNTER — Encounter: Payer: Self-pay | Admitting: Internal Medicine

## 2023-06-24 DIAGNOSIS — J208 Acute bronchitis due to other specified organisms: Secondary | ICD-10-CM | POA: Diagnosis not present

## 2023-06-24 DIAGNOSIS — B9689 Other specified bacterial agents as the cause of diseases classified elsewhere: Secondary | ICD-10-CM | POA: Insufficient documentation

## 2023-06-24 MED ORDER — ALBUTEROL SULFATE HFA 108 (90 BASE) MCG/ACT IN AERS: 2.0000 | INHALATION_SPRAY | Freq: Four times a day (QID) | RESPIRATORY_TRACT | 0 refills | Status: DC | PRN

## 2023-06-24 MED ORDER — AMOXICILLIN-POT CLAVULANATE 875-125 MG PO TABS: 1.0000 | ORAL_TABLET | Freq: Two times a day (BID) | ORAL | 0 refills | Status: AC

## 2023-06-24 NOTE — Progress Notes (Signed)
Virtual Visit via Video Note  I connected with Andre Lynch on 06/24/23 at  2:20 PM EST by a video enabled telemedicine application and verified that I am speaking with the correct person using two identifiers.  Patient Location: Home Provider Location: Office/Clinic  I discussed the limitations, risks, security, and privacy concerns of performing an evaluation and management service by video and the availability of in person appointments. I also discussed with the patient that there may be a patient responsible charge related to this service. The patient expressed understanding and agreed to proceed.  Subjective: PCP: Andre Lade, MD  Chief Complaint  Patient presents with   Follow-up    Possible covid has not tested sick since jan 1st no smell some taste fever cough   Andre Lynch has been evaluated through video encounter today for a several week history of sinus and chest congestion.  He endorses symptom onset around 1/1 and it was initially concerned that he had COVID-19 or influenza.  He has been using over-the-counter cough and cold medications to alleviate his symptoms.  Symptoms were gradually improving, but he began to regress earlier this week.  He currently endorses sinus congestion, chest congestion, cough productive of yellow/green sputum, headaches, loss of appetite, dulled taste and smell, and subjective fever/chills.  He denies nausea/vomiting and diarrhea.  He is unaware of any recent sick contacts.  He did not complete testing for COVID-19 or influenza.  ROS: Per HPI  Current Outpatient Medications:    albuterol (VENTOLIN HFA) 108 (90 Base) MCG/ACT inhaler, Inhale 2 puffs into the lungs every 6 (six) hours as needed for wheezing or shortness of breath., Disp: 8 g, Rfl: 0   amoxicillin-clavulanate (AUGMENTIN) 875-125 MG tablet, Take 1 tablet by mouth 2 (two) times daily for 5 days., Disp: 10 tablet, Rfl: 0   buprenorphine-naloxone (SUBOXONE) 8-2 mg SUBL SL tablet,  Place 1 tablet under the tongue 2 (two) times daily., Disp: , Rfl:    CAPLYTA 42 MG capsule, SMARTSIG:1 Capsule(s) By Mouth Every Evening, Disp: , Rfl:    clonazePAM (KLONOPIN) 0.5 MG tablet, Take 0.5 mg by mouth 2 (two) times daily as needed., Disp: , Rfl:    cyclobenzaprine (FLEXERIL) 10 MG tablet, Take 1 tablet (10 mg total) by mouth at bedtime. One tablet every night at bedtime as needed for spasm., Disp: 30 tablet, Rfl: 4   gabapentin (NEURONTIN) 300 MG capsule, Take by mouth 2 (two) times daily., Disp: , Rfl:    Iron, Ferrous Sulfate, 325 (65 Fe) MG TABS, Take 325 mg by mouth daily., Disp: 90 tablet, Rfl: 0   methylphenidate 54 MG PO CR tablet, Take 54 mg by mouth every morning., Disp: , Rfl:    NEEDLE, DISP, 21 G (BD SAFETYGLIDE SHIELDED NEEDLE) 21G X 1-1/2" MISC, 1 Needle by Does not apply route every 14 (fourteen) days., Disp: 50 each, Rfl: 0   SYRINGE-NEEDLE, DISP, 3 ML (B-D SYRINGE/NEEDLE 3CC/23GX1") 23G X 1" 3 ML MISC, 1 each by Does not apply route every 14 (fourteen) days., Disp: 50 each, Rfl: 0   testosterone cypionate (DEPOTESTOSTERONE CYPIONATE) 200 MG/ML injection, Inject 1 mL (200 mg total) into the muscle every 14 (fourteen) days for 28 days., Disp: 2 mL, Rfl: 0  Assessment and Plan:  Acute bacterial bronchitis Assessment & Plan: Evaluated today through video encounter endorsing symptoms described above.  Symptoms have been present for 3 weeks.  I am concerned that he initially had a viral upper respiratory infection and more  recently has developed a superimposed bacterial infection.  Symptoms are concerning for bacterial bronchitis. -Augmentin x 5 days prescribed for treatment.  Recommend continued supportive care measures.  Will add albuterol as he endorses vague shortness of breath.  This will aid with airway clearance.  He was instructed to return to care for in person evaluation if symptoms worsen or fail to improve.  Follow Up Instructions: Return if symptoms worsen or  fail to improve.   I discussed the assessment and treatment plan with the patient. The patient was provided an opportunity to ask questions, and all were answered. The patient agreed with the plan and demonstrated an understanding of the instructions.   The patient was advised to call back or seek an in-person evaluation if the symptoms worsen or if the condition fails to improve as anticipated.  The above assessment and management plan was discussed with the patient. The patient verbalized understanding of and has agreed to the management plan.   Andre Lade, MD

## 2023-06-24 NOTE — Assessment & Plan Note (Signed)
Evaluated today through video encounter endorsing symptoms described above.  Symptoms have been present for 3 weeks.  I am concerned that he initially had a viral upper respiratory infection and more recently has developed a superimposed bacterial infection.  Symptoms are concerning for bacterial bronchitis. -Augmentin x 5 days prescribed for treatment.  Recommend continued supportive care measures.  Will add albuterol as he endorses vague shortness of breath.  This will aid with airway clearance.  He was instructed to return to care for in person evaluation if symptoms worsen or fail to improve.

## 2023-06-28 DIAGNOSIS — F900 Attention-deficit hyperactivity disorder, predominantly inattentive type: Secondary | ICD-10-CM | POA: Diagnosis not present

## 2023-06-28 DIAGNOSIS — F4312 Post-traumatic stress disorder, chronic: Secondary | ICD-10-CM | POA: Diagnosis not present

## 2023-06-28 DIAGNOSIS — Z79899 Other long term (current) drug therapy: Secondary | ICD-10-CM | POA: Diagnosis not present

## 2023-06-28 DIAGNOSIS — F319 Bipolar disorder, unspecified: Secondary | ICD-10-CM | POA: Diagnosis not present

## 2023-06-28 DIAGNOSIS — Z5181 Encounter for therapeutic drug level monitoring: Secondary | ICD-10-CM | POA: Diagnosis not present

## 2023-06-28 DIAGNOSIS — B182 Chronic viral hepatitis C: Secondary | ICD-10-CM | POA: Diagnosis not present

## 2023-06-28 DIAGNOSIS — F411 Generalized anxiety disorder: Secondary | ICD-10-CM | POA: Diagnosis not present

## 2023-07-02 DIAGNOSIS — Z419 Encounter for procedure for purposes other than remedying health state, unspecified: Secondary | ICD-10-CM | POA: Diagnosis not present

## 2023-07-19 DIAGNOSIS — F411 Generalized anxiety disorder: Secondary | ICD-10-CM | POA: Diagnosis not present

## 2023-07-19 DIAGNOSIS — F319 Bipolar disorder, unspecified: Secondary | ICD-10-CM | POA: Diagnosis not present

## 2023-07-19 DIAGNOSIS — Z5181 Encounter for therapeutic drug level monitoring: Secondary | ICD-10-CM | POA: Diagnosis not present

## 2023-07-19 DIAGNOSIS — F900 Attention-deficit hyperactivity disorder, predominantly inattentive type: Secondary | ICD-10-CM | POA: Diagnosis not present

## 2023-07-19 DIAGNOSIS — B182 Chronic viral hepatitis C: Secondary | ICD-10-CM | POA: Diagnosis not present

## 2023-07-26 DIAGNOSIS — F319 Bipolar disorder, unspecified: Secondary | ICD-10-CM | POA: Diagnosis not present

## 2023-07-26 DIAGNOSIS — F900 Attention-deficit hyperactivity disorder, predominantly inattentive type: Secondary | ICD-10-CM | POA: Diagnosis not present

## 2023-07-26 DIAGNOSIS — F4312 Post-traumatic stress disorder, chronic: Secondary | ICD-10-CM | POA: Diagnosis not present

## 2023-07-26 DIAGNOSIS — F411 Generalized anxiety disorder: Secondary | ICD-10-CM | POA: Diagnosis not present

## 2023-07-30 DIAGNOSIS — Z419 Encounter for procedure for purposes other than remedying health state, unspecified: Secondary | ICD-10-CM | POA: Diagnosis not present

## 2023-08-23 DIAGNOSIS — F900 Attention-deficit hyperactivity disorder, predominantly inattentive type: Secondary | ICD-10-CM | POA: Diagnosis not present

## 2023-08-23 DIAGNOSIS — F4312 Post-traumatic stress disorder, chronic: Secondary | ICD-10-CM | POA: Diagnosis not present

## 2023-08-23 DIAGNOSIS — Z5181 Encounter for therapeutic drug level monitoring: Secondary | ICD-10-CM | POA: Diagnosis not present

## 2023-08-23 DIAGNOSIS — F411 Generalized anxiety disorder: Secondary | ICD-10-CM | POA: Diagnosis not present

## 2023-08-23 DIAGNOSIS — F319 Bipolar disorder, unspecified: Secondary | ICD-10-CM | POA: Diagnosis not present

## 2023-09-10 DIAGNOSIS — Z419 Encounter for procedure for purposes other than remedying health state, unspecified: Secondary | ICD-10-CM | POA: Diagnosis not present

## 2023-09-19 DIAGNOSIS — Z5181 Encounter for therapeutic drug level monitoring: Secondary | ICD-10-CM | POA: Diagnosis not present

## 2023-09-20 DIAGNOSIS — F411 Generalized anxiety disorder: Secondary | ICD-10-CM | POA: Diagnosis not present

## 2023-09-20 DIAGNOSIS — F4312 Post-traumatic stress disorder, chronic: Secondary | ICD-10-CM | POA: Diagnosis not present

## 2023-09-20 DIAGNOSIS — F900 Attention-deficit hyperactivity disorder, predominantly inattentive type: Secondary | ICD-10-CM | POA: Diagnosis not present

## 2023-09-20 DIAGNOSIS — F319 Bipolar disorder, unspecified: Secondary | ICD-10-CM | POA: Diagnosis not present

## 2023-10-08 ENCOUNTER — Emergency Department (HOSPITAL_COMMUNITY)

## 2023-10-08 ENCOUNTER — Emergency Department (HOSPITAL_COMMUNITY)
Admission: EM | Admit: 2023-10-08 | Discharge: 2023-10-08 | Disposition: A | Discharge status: Home / Self Care | Attending: Student | Admitting: Student

## 2023-10-08 ENCOUNTER — Other Ambulatory Visit: Payer: Self-pay

## 2023-10-08 ENCOUNTER — Encounter (HOSPITAL_COMMUNITY): Payer: Self-pay

## 2023-10-08 DIAGNOSIS — S022XXA Fracture of nasal bones, initial encounter for closed fracture: Secondary | ICD-10-CM | POA: Insufficient documentation

## 2023-10-08 DIAGNOSIS — S60011A Contusion of right thumb without damage to nail, initial encounter: Secondary | ICD-10-CM | POA: Insufficient documentation

## 2023-10-08 DIAGNOSIS — S6991XA Unspecified injury of right wrist, hand and finger(s), initial encounter: Secondary | ICD-10-CM | POA: Diagnosis not present

## 2023-10-08 DIAGNOSIS — M79644 Pain in right finger(s): Secondary | ICD-10-CM

## 2023-10-08 DIAGNOSIS — F17210 Nicotine dependence, cigarettes, uncomplicated: Secondary | ICD-10-CM | POA: Diagnosis not present

## 2023-10-08 DIAGNOSIS — I700 Atherosclerosis of aorta: Secondary | ICD-10-CM | POA: Diagnosis not present

## 2023-10-08 DIAGNOSIS — S0990XA Unspecified injury of head, initial encounter: Secondary | ICD-10-CM | POA: Diagnosis not present

## 2023-10-08 DIAGNOSIS — S0993XA Unspecified injury of face, initial encounter: Secondary | ICD-10-CM | POA: Diagnosis present

## 2023-10-08 MED ORDER — ACETAMINOPHEN 500 MG PO TABS: 1000.0000 mg | ORAL_TABLET | Freq: Three times a day (TID) | ORAL | 0 refills | Status: AC

## 2023-10-08 MED ORDER — KETOROLAC TROMETHAMINE 15 MG/ML IJ SOLN
15.0000 mg | Freq: Once | INTRAMUSCULAR | Status: AC
  Administered 2023-10-08: 15 mg via INTRAMUSCULAR
  Filled 2023-10-08: qty 1

## 2023-10-08 MED ORDER — LIDOCAINE 5 % EX PTCH
1.0000 | MEDICATED_PATCH | CUTANEOUS | Status: DC
  Administered 2023-10-08: 1 via TRANSDERMAL
  Filled 2023-10-08: qty 1

## 2023-10-08 MED ORDER — NAPROXEN 375 MG PO TABS: 375.0000 mg | ORAL_TABLET | Freq: Two times a day (BID) | ORAL | 0 refills | Status: AC
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
## 2023-10-08 NOTE — ED Notes (Signed)
 Patient transported to CT

## 2023-10-08 NOTE — ED Notes (Signed)
 Pt returns from ct, [facility] at bedside to take report,

## 2023-10-10 DIAGNOSIS — Z419 Encounter for procedure for purposes other than remedying health state, unspecified: Secondary | ICD-10-CM | POA: Diagnosis not present

## 2023-10-17 DIAGNOSIS — Z5181 Encounter for therapeutic drug level monitoring: Secondary | ICD-10-CM | POA: Diagnosis not present

## 2023-10-17 DIAGNOSIS — F4312 Post-traumatic stress disorder, chronic: Secondary | ICD-10-CM | POA: Diagnosis not present

## 2023-10-17 DIAGNOSIS — F319 Bipolar disorder, unspecified: Secondary | ICD-10-CM | POA: Diagnosis not present

## 2023-10-17 DIAGNOSIS — F411 Generalized anxiety disorder: Secondary | ICD-10-CM | POA: Diagnosis not present

## 2023-10-17 DIAGNOSIS — F900 Attention-deficit hyperactivity disorder, predominantly inattentive type: Secondary | ICD-10-CM | POA: Diagnosis not present

## 2023-11-10 DIAGNOSIS — Z419 Encounter for procedure for purposes other than remedying health state, unspecified: Secondary | ICD-10-CM | POA: Diagnosis not present

## 2023-11-17 DIAGNOSIS — F4312 Post-traumatic stress disorder, chronic: Secondary | ICD-10-CM | POA: Diagnosis not present

## 2023-11-17 DIAGNOSIS — F411 Generalized anxiety disorder: Secondary | ICD-10-CM | POA: Diagnosis not present

## 2023-11-17 DIAGNOSIS — Z5181 Encounter for therapeutic drug level monitoring: Secondary | ICD-10-CM | POA: Diagnosis not present

## 2023-11-17 DIAGNOSIS — F319 Bipolar disorder, unspecified: Secondary | ICD-10-CM | POA: Diagnosis not present

## 2023-11-17 DIAGNOSIS — F900 Attention-deficit hyperactivity disorder, predominantly inattentive type: Secondary | ICD-10-CM | POA: Diagnosis not present

## 2023-11-30 ENCOUNTER — Encounter: Payer: Self-pay | Admitting: Internal Medicine

## 2023-12-10 DIAGNOSIS — Z419 Encounter for procedure for purposes other than remedying health state, unspecified: Secondary | ICD-10-CM | POA: Diagnosis not present

## 2024-01-03 DIAGNOSIS — F319 Bipolar disorder, unspecified: Secondary | ICD-10-CM | POA: Diagnosis not present

## 2024-01-03 DIAGNOSIS — F4312 Post-traumatic stress disorder, chronic: Secondary | ICD-10-CM | POA: Diagnosis not present

## 2024-01-03 DIAGNOSIS — Z5181 Encounter for therapeutic drug level monitoring: Secondary | ICD-10-CM | POA: Diagnosis not present

## 2024-01-03 DIAGNOSIS — F900 Attention-deficit hyperactivity disorder, predominantly inattentive type: Secondary | ICD-10-CM | POA: Diagnosis not present

## 2024-01-03 DIAGNOSIS — F411 Generalized anxiety disorder: Secondary | ICD-10-CM | POA: Diagnosis not present

## 2024-01-10 DIAGNOSIS — Z419 Encounter for procedure for purposes other than remedying health state, unspecified: Secondary | ICD-10-CM | POA: Diagnosis not present

## 2024-01-11 ENCOUNTER — Ambulatory Visit: Payer: Self-pay

## 2024-01-16 DIAGNOSIS — Z5181 Encounter for therapeutic drug level monitoring: Secondary | ICD-10-CM | POA: Diagnosis not present

## 2024-01-16 DIAGNOSIS — F331 Major depressive disorder, recurrent, moderate: Secondary | ICD-10-CM | POA: Diagnosis not present

## 2024-01-18 ENCOUNTER — Ambulatory Visit: Admitting: Orthopaedic Surgery

## 2024-01-18 ENCOUNTER — Encounter: Payer: Self-pay | Admitting: Orthopaedic Surgery

## 2024-01-18 VITALS — BP 111/59 | HR 103 | Ht 69.0 in | Wt 164.5 lb

## 2024-01-18 DIAGNOSIS — M5442 Lumbago with sciatica, left side: Secondary | ICD-10-CM | POA: Diagnosis not present

## 2024-01-18 DIAGNOSIS — G894 Chronic pain syndrome: Secondary | ICD-10-CM | POA: Diagnosis not present

## 2024-01-18 DIAGNOSIS — Z5181 Encounter for therapeutic drug level monitoring: Secondary | ICD-10-CM | POA: Diagnosis not present

## 2024-01-18 DIAGNOSIS — F331 Major depressive disorder, recurrent, moderate: Secondary | ICD-10-CM | POA: Diagnosis not present

## 2024-01-18 DIAGNOSIS — M5441 Lumbago with sciatica, right side: Secondary | ICD-10-CM | POA: Diagnosis not present

## 2024-01-18 DIAGNOSIS — G8929 Other chronic pain: Secondary | ICD-10-CM

## 2024-01-18 NOTE — Progress Notes (Signed)
 My back hurts.  He has chronic lower back pain that is disabling.  He has seen neurosurgery, PT, dry needling,  He has done pool therapy, has a TENS unit.  He continues to have pain.  I last saw him in February of 2024.  He is applying for disability.  He cannot lay more than 3 hours at a time and stand more than a half-hour at a time.  He tries to walk some and uses a pool some.  He is tired of hurting.  ROM is very limited in any direction, lower back is tight but no spasm, NV intact, muscle tone and strength normal.  Encounter Diagnoses  Name Primary?   Chronic pain syndrome Yes   Chronic bilateral low back pain with bilateral sciatica    I have given handicapped sticker.  I have gone over instructions to try more pool activities, walking program, use TENS more, consider chiropractor, consider massage therapy and be more active.  I have no magic answer for his problem.  Call if any problem.  Precautions discussed.  Electronically Signed Lemond Stable, MD 8/20/20254:08 PM

## 2024-01-20 ENCOUNTER — Encounter: Payer: Self-pay | Admitting: Radiology

## 2024-01-23 DIAGNOSIS — F331 Major depressive disorder, recurrent, moderate: Secondary | ICD-10-CM | POA: Diagnosis not present

## 2024-01-23 DIAGNOSIS — Z5181 Encounter for therapeutic drug level monitoring: Secondary | ICD-10-CM | POA: Diagnosis not present

## 2024-01-31 DIAGNOSIS — F319 Bipolar disorder, unspecified: Secondary | ICD-10-CM | POA: Diagnosis not present

## 2024-01-31 DIAGNOSIS — F4312 Post-traumatic stress disorder, chronic: Secondary | ICD-10-CM | POA: Diagnosis not present

## 2024-01-31 DIAGNOSIS — F411 Generalized anxiety disorder: Secondary | ICD-10-CM | POA: Diagnosis not present

## 2024-01-31 DIAGNOSIS — Z5181 Encounter for therapeutic drug level monitoring: Secondary | ICD-10-CM | POA: Diagnosis not present

## 2024-01-31 DIAGNOSIS — F900 Attention-deficit hyperactivity disorder, predominantly inattentive type: Secondary | ICD-10-CM | POA: Diagnosis not present

## 2024-03-01 ENCOUNTER — Telehealth: Payer: Self-pay

## 2024-03-01 NOTE — Telephone Encounter (Signed)
 Patient called to check about his forms that his lawyer had faxed to us  for Dr. Brenna.   Please call and discuss with patient

## 2024-03-08 ENCOUNTER — Telehealth: Admitting: Physician Assistant

## 2024-03-08 DIAGNOSIS — J208 Acute bronchitis due to other specified organisms: Secondary | ICD-10-CM | POA: Diagnosis not present

## 2024-03-08 DIAGNOSIS — B9689 Other specified bacterial agents as the cause of diseases classified elsewhere: Secondary | ICD-10-CM

## 2024-03-08 MED ORDER — AZITHROMYCIN 250 MG PO TABS: ORAL_TABLET | ORAL | 0 refills | Status: AC

## 2024-03-08 MED ORDER — ALBUTEROL SULFATE HFA 108 (90 BASE) MCG/ACT IN AERS: 1.0000 | INHALATION_SPRAY | Freq: Four times a day (QID) | RESPIRATORY_TRACT | 0 refills | Status: AC | PRN

## 2024-03-08 MED ORDER — BENZONATATE 100 MG PO CAPS: 100.0000 mg | ORAL_CAPSULE | Freq: Three times a day (TID) | ORAL | 0 refills | Status: AC | PRN

## 2024-03-08 NOTE — Progress Notes (Signed)
 Virtual Visit Consent   Andre Lynch, you are scheduled for a virtual visit with a [facility] provider today. Just as with appointments in the office, your consent must be obtained to participate. Your consent will be active for this visit and any virtual visit you may have with one of our providers in the next 365 days. If you have a MyChart account, a copy of this consent can be sent to you electronically.  As this is a virtual visit, video technology does not allow for your provider to perform a traditional examination. This may limit your provider's ability to fully assess your condition. If your provider identifies any concerns that need to be evaluated in person or the need to arrange testing (such as labs, EKG, etc.), we will make arrangements to do so. Although advances in technology are sophisticated, we cannot ensure that it will always work on either your end or our end. If the connection with a video visit is poor, the visit may have to be switched to a telephone visit. With either a video or telephone visit, we are not always able to ensure that we have a secure connection.  By engaging in this virtual visit, you consent to the provision of healthcare and authorize for your insurance to be billed (if applicable) for the services provided during this visit. Depending on your insurance coverage, you may receive a charge related to this service.  I need to obtain your verbal consent now. Are you willing to proceed with your visit today? Andre Lynch has provided verbal consent on 03/08/2024 for a virtual visit (video or telephone). Andre Lynch, [place]  Date: 03/08/2024 1:44 PM   Virtual Visit via Video Note   I, Andre Lynch, connected with  DEL OVERFELT  ([id number], Aug 04, 1980) on 03/08/24 at  1:45 PM EDT by a video-enabled telemedicine application and verified that I am speaking with the correct person using two identifiers.  Location: Patient: Virtual Visit Location  Patient: Home Provider: Virtual Visit Location Provider: Home Office   I discussed the limitations of evaluation and management by telemedicine and the availability of in person appointments. The patient expressed understanding and agreed to proceed.    History of Present Illness: Andre Lynch is a 43 y.o. who identifies as a male who was assigned male at birth, and is being seen today for 1+ week of progressively worsening chest congestion, productive cough and sore throat. Notes at time having chest tightness and mild SOB when trying to lay down. Borderline fever noted. Denies chest pain. Some head congestion and mild sinus pressure. Denies recent travel or any known sick contacts.  OTC -- Mucinex, Allegra.  HPI: HPI  Problems:  Patient Active Problem List   Diagnosis Date Noted   Acute bacterial bronchitis 06/24/2023   CAP (community acquired pneumonia) 08/17/2022   Nevus of back 08/17/2022   Need for influenza vaccination 06/22/2022   Iron  deficiency 05/19/2022   Current smoker 04/09/2022   Hypogonadism, male 03/02/2022   ADHD 02/10/2022   Tobacco use 02/10/2022   Fatigue 02/10/2022   Preventative health care 02/10/2022   Chronic bilateral low back pain with sciatica 10/14/2021   Abdominal distension 10/14/2021   Breast tenderness 10/14/2021   Intertrigo 10/14/2021   Chronic hepatitis C without hepatic coma (HCC) 09/17/2021   IVDU (intravenous drug user) 09/17/2021   Lower extremity edema 09/17/2021   Hepatitis C antibody positive in blood 06/23/2021   Localized edema 06/23/2021   High  risk heterosexual behavior 12/08/2020   Insomnia 09/19/2020   History of manic depressive disorder 09/19/2020   History of intravenous drug abuse 09/19/2020   Exposure to human immunodeficiency virus 09/19/2020   Anxiety 09/19/2020   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/27/2020   Mental health disorder    Acute encephalopathy 06/25/2020   Substance use disorder 06/25/2020   Chronic pain  syndrome 06/25/2020   Benzodiazepine withdrawal without complication (HCC)    Seizures (HCC) 06/24/2020   Cannabis abuse, episodic use 05/17/2020   Opioid use disorder 07/27/2018   Opioid dependence on agonist therapy (HCC) 07/27/2018   Cigarette nicotine  dependence without complication 07/27/2018   H/O: substance abuse (HCC) 07/28/2012   GAD (generalized anxiety disorder) 07/28/2012    Allergies:  Allergies  Allergen Reactions   Ibuprofen Other (See Comments)    Cannot take due to stomach ulcers   Tylenol  [Acetaminophen ] Other (See Comments)    Cannot take due to stomach ulcers   Medications:  Current Outpatient Medications:    albuterol  (VENTOLIN  HFA) 108 (90 Base) MCG/ACT inhaler, Inhale 1-2 puffs into the lungs every 6 (six) hours as needed for wheezing or shortness of breath., Disp: 6.7 g, Rfl: 0   azithromycin  (ZITHROMAX ) 250 MG tablet, Take 2 tablets on day 1, then 1 tablet daily on days 2 through 5, Disp: 6 tablet, Rfl: 0   benzonatate (TESSALON) 100 MG capsule, Take 1 capsule (100 mg total) by mouth 3 (three) times daily as needed for cough., Disp: 30 capsule, Rfl: 0   buprenorphine -naloxone  (SUBOXONE ) 8-2 mg SUBL SL tablet, Place 1 tablet under the tongue 2 (two) times daily., Disp: , Rfl:    CAPLYTA 42 MG capsule, SMARTSIG:1 Capsule(s) By Mouth Every Evening, Disp: , Rfl:    clonazePAM  (KLONOPIN ) 0.5 MG tablet, Take 0.5 mg by mouth 2 (two) times daily as needed., Disp: , Rfl:    gabapentin (NEURONTIN) 300 MG capsule, Take by mouth 2 (two) times daily., Disp: , Rfl:    Iron , Ferrous Sulfate , 325 (65 Fe) MG TABS, Take 325 mg by mouth daily., Disp: 90 tablet, Rfl: 0   naproxen  (NAPROSYN ) 375 MG tablet, Take 1 tablet (375 mg total) by mouth 2 (two) times daily., Disp: 20 tablet, Rfl: 0   NEEDLE, DISP, 21 G (BD SAFETYGLIDE SHIELDED NEEDLE) 21G X 1-1/2 MISC, 1 Needle by Does not apply route every 14 (fourteen) days., Disp: 50 each, Rfl: 0   SYRINGE-NEEDLE, DISP, 3 ML (B-D  SYRINGE/NEEDLE 3CC/23GX1) 23G X 1 3 ML MISC, 1 each by Does not apply route every 14 (fourteen) days., Disp: 50 each, Rfl: 0  Observations/Objective: Patient is well-developed, well-nourished in no acute distress.  Resting comfortably at home.  Head is normocephalic, atraumatic.  No labored breathing.  Speech is clear and coherent with logical content.  Patient is alert and oriented at baseline.  Assessment and Plan: 1. Acute bacterial bronchitis (Primary) - benzonatate (TESSALON) 100 MG capsule; Take 1 capsule (100 mg total) by mouth 3 (three) times daily as needed for cough.  Dispense: 30 capsule; Refill: 0 - azithromycin  (ZITHROMAX ) 250 MG tablet; Take 2 tablets on day 1, then 1 tablet daily on days 2 through 5  Dispense: 6 tablet; Refill: 0 - albuterol  (VENTOLIN  HFA) 108 (90 Base) MCG/ACT inhaler; Inhale 1-2 puffs into the lungs every 6 (six) hours as needed for wheezing or shortness of breath.  Dispense: 6.7 g; Refill: 0  Rx Azithromycin .  Increase fluids.  Rest.  Saline nasal spray.  Probiotic.  Mucinex as  directed.  Humidifier in bedroom. Tessalon and Albuterol  per orders.  Call or return to clinic if symptoms are not improving.   Follow Up Instructions: I discussed the assessment and treatment plan with the patient. The patient was provided an opportunity to ask questions and all were answered. The patient agreed with the plan and demonstrated an understanding of the instructions.  A copy of instructions were sent to the patient via MyChart unless otherwise noted below.   The patient was advised to call back or seek an in-person evaluation if the symptoms worsen or if the condition fails to improve as anticipated.    Andre Velma Lunger, PA-C

## 2024-03-08 NOTE — Patient Instructions (Signed)
 Selinda CHRISTELLA Monte, thank you for joining [name], PA-C for today's virtual visit.  While this provider is not your primary care provider (PCP), if your PCP is located in our provider database this encounter information will be shared with them immediately following your visit.   A [facility] MyChart account gives you access to today's visit and all your visits, tests, and labs performed at [facility]  click here if you don't have a [facility] MyChart account or go to [email or web address]  Consent: (Patient) Andre Lynch provided verbal consent for this virtual visit at the beginning of the encounter.  Current Medications:  Current Outpatient Medications:    albuterol  (VENTOLIN  HFA) 108 (90 Base) MCG/ACT inhaler, Inhale 2 puffs into the lungs every 6 (six) hours as needed for wheezing or shortness of breath., Disp: 8 g, Rfl: 0   buprenorphine -naloxone  (SUBOXONE ) 8-2 mg SUBL SL tablet, Place 1 tablet under the tongue 2 (two) times daily., Disp: , Rfl:    CAPLYTA 42 MG capsule, SMARTSIG:1 Capsule(s) By Mouth Every Evening, Disp: , Rfl:    clonazePAM  (KLONOPIN ) 0.5 MG tablet, Take 0.5 mg by mouth 2 (two) times daily as needed., Disp: , Rfl:    cyclobenzaprine  (FLEXERIL ) 10 MG tablet, Take 1 tablet (10 mg total) by mouth at bedtime. One tablet every night at bedtime as needed for spasm., Disp: 30 tablet, Rfl: 4   gabapentin (NEURONTIN) 300 MG capsule, Take by mouth 2 (two) times daily., Disp: , Rfl:    Iron , Ferrous Sulfate , 325 (65 Fe) MG TABS, Take 325 mg by mouth daily., Disp: 90 tablet, Rfl: 0   methylphenidate 54 MG PO CR tablet, Take 54 mg by mouth every morning., Disp: , Rfl:    naproxen  (NAPROSYN ) 375 MG tablet, Take 1 tablet (375 mg total) by mouth 2 (two) times daily., Disp: 20 tablet, Rfl: 0   NEEDLE, DISP, 21 G (BD SAFETYGLIDE SHIELDED NEEDLE) 21G X 1-1/2 MISC, 1 Needle by Does not apply route every 14 (fourteen) days., Disp: 50 each, Rfl: 0    SYRINGE-NEEDLE, DISP, 3 ML (B-D SYRINGE/NEEDLE 3CC/23GX1) 23G X 1 3 ML MISC, 1 each by Does not apply route every 14 (fourteen) days., Disp: 50 each, Rfl: 0   testosterone  cypionate (DEPOTESTOSTERONE CYPIONATE) 200 MG/ML injection, Inject 1 mL (200 mg total) into the muscle every 14 (fourteen) days for 28 days., Disp: 2 mL, Rfl: 0   Medications ordered in this encounter:  No orders of the defined types were placed in this encounter.    *If you need refills on other medications prior to your next appointment, please contact your pharmacy*  Follow-Up: Call back or seek an in-person evaluation if the symptoms worsen or if the condition fails to improve as anticipated.  [facility] [phone]  Other Instructions Take antibiotic (Azithromycin ) as directed.  Increase fluids.  Get plenty of rest. Use Mucinex for congestion. Use the Tessalon and albuterol  as directed. Take a daily probiotic (I recommend Align or Culturelle, but even Activia Yogurt may be beneficial).  A humidifier placed in the bedroom may offer some relief for a dry, scratchy throat of nasal irritation.  Read information below on acute bronchitis. If you note any non-resolving, new, or worsening symptoms despite treatment, please seek an in-person evaluation ASAP.   Acute Bronchitis Bronchitis is when the airways that extend from the windpipe into the lungs get red, puffy, and painful (inflamed). Bronchitis often causes thick spit (mucus) to develop. This leads  to a cough. A cough is the most common symptom of bronchitis. In acute bronchitis, the condition usually begins suddenly and goes away over time (usually in 2 weeks). Smoking, allergies, and asthma can make bronchitis worse. Repeated episodes of bronchitis may cause more lung problems.  HOME CARE Rest. Drink enough fluids to keep your pee (urine) clear or pale yellow (unless you need to limit fluids as told by your doctor). Only take over-the-counter or  prescription medicines as told by your doctor. Avoid smoking and secondhand smoke. These can make bronchitis worse. If you are a smoker, think about using nicotine  gum or skin patches. Quitting smoking will help your lungs heal faster. Reduce the chance of getting bronchitis again by: Washing your hands often. Avoiding people with cold symptoms. Trying not to touch your hands to your mouth, nose, or eyes. Follow up with your doctor as told.  GET HELP IF: Your symptoms do not improve after 1 week of treatment. Symptoms include: Cough. Fever. Coughing up thick spit. Body aches. Chest congestion. Chills. Shortness of breath. Sore throat.  GET HELP RIGHT AWAY IF:  You have an increased fever. You have chills. You have severe shortness of breath. You have bloody thick spit (sputum). You throw up (vomit) often. You lose too much body fluid (dehydration). You have a severe headache. You faint.  MAKE SURE YOU:  Understand these instructions. Will watch your condition. Will get help right away if you are not doing well or get worse. Document Released: 11/03/2007 Document Revised: 01/17/2013 Document Reviewed: 11/07/2012 [facility] Patient Information 2015 [place], [place]. This information is not intended to replace advice given to you by your health care provider. Make sure you discuss any questions you have with your health care provider.    If you have been instructed to have an in-person evaluation today at a local Urgent Care facility, please use the link below. It will take you to a list of all of our available [facility], including address, phone number and hours of operation. Please do not delay care.  [facility]  If you or a family member do not have a primary care provider, use the link below to schedule a visit and establish care. When you choose a [facility] primary care physician or advanced practice provider, you gain a long-term partner in  health. Find a Primary Care Provider  Learn more about [facility] in-office and virtual care options: [facility] - Get Care Now

## 2024-03-12 DIAGNOSIS — F331 Major depressive disorder, recurrent, moderate: Secondary | ICD-10-CM | POA: Diagnosis not present

## 2024-03-14 DIAGNOSIS — F331 Major depressive disorder, recurrent, moderate: Secondary | ICD-10-CM | POA: Diagnosis not present

## 2024-03-15 DIAGNOSIS — F332 Major depressive disorder, recurrent severe without psychotic features: Secondary | ICD-10-CM | POA: Diagnosis not present

## 2024-03-19 DIAGNOSIS — F331 Major depressive disorder, recurrent, moderate: Secondary | ICD-10-CM | POA: Diagnosis not present

## 2024-03-21 DIAGNOSIS — F4312 Post-traumatic stress disorder, chronic: Secondary | ICD-10-CM | POA: Diagnosis not present

## 2024-03-21 DIAGNOSIS — F319 Bipolar disorder, unspecified: Secondary | ICD-10-CM | POA: Diagnosis not present

## 2024-03-21 DIAGNOSIS — B182 Chronic viral hepatitis C: Secondary | ICD-10-CM | POA: Diagnosis not present

## 2024-03-21 DIAGNOSIS — Z79899 Other long term (current) drug therapy: Secondary | ICD-10-CM | POA: Diagnosis not present

## 2024-03-21 DIAGNOSIS — F331 Major depressive disorder, recurrent, moderate: Secondary | ICD-10-CM | POA: Diagnosis not present

## 2024-03-21 DIAGNOSIS — F900 Attention-deficit hyperactivity disorder, predominantly inattentive type: Secondary | ICD-10-CM | POA: Diagnosis not present

## 2024-03-21 DIAGNOSIS — F411 Generalized anxiety disorder: Secondary | ICD-10-CM | POA: Diagnosis not present

## 2024-03-26 DIAGNOSIS — F331 Major depressive disorder, recurrent, moderate: Secondary | ICD-10-CM | POA: Diagnosis not present

## 2024-03-26 DIAGNOSIS — Z5181 Encounter for therapeutic drug level monitoring: Secondary | ICD-10-CM | POA: Diagnosis not present

## 2024-03-28 DIAGNOSIS — F331 Major depressive disorder, recurrent, moderate: Secondary | ICD-10-CM | POA: Diagnosis not present

## 2024-03-28 DIAGNOSIS — Z5181 Encounter for therapeutic drug level monitoring: Secondary | ICD-10-CM | POA: Diagnosis not present

## 2024-04-02 ENCOUNTER — Encounter: Payer: Self-pay | Admitting: Radiology

## 2024-04-02 DIAGNOSIS — Z5181 Encounter for therapeutic drug level monitoring: Secondary | ICD-10-CM | POA: Diagnosis not present

## 2024-04-02 DIAGNOSIS — F331 Major depressive disorder, recurrent, moderate: Secondary | ICD-10-CM | POA: Diagnosis not present

## 2024-04-11 DIAGNOSIS — F332 Major depressive disorder, recurrent severe without psychotic features: Secondary | ICD-10-CM | POA: Diagnosis not present

## 2024-04-17 DIAGNOSIS — F332 Major depressive disorder, recurrent severe without psychotic features: Secondary | ICD-10-CM | POA: Diagnosis not present

## 2024-04-19 DIAGNOSIS — F332 Major depressive disorder, recurrent severe without psychotic features: Secondary | ICD-10-CM | POA: Diagnosis not present

## 2024-04-25 DIAGNOSIS — F332 Major depressive disorder, recurrent severe without psychotic features: Secondary | ICD-10-CM | POA: Diagnosis not present

## 2024-05-01 DIAGNOSIS — F332 Major depressive disorder, recurrent severe without psychotic features: Secondary | ICD-10-CM | POA: Diagnosis not present

## 2024-05-03 ENCOUNTER — Emergency Department (HOSPITAL_COMMUNITY)
Admission: EM | Admit: 2024-05-03 | Discharge: 2024-05-03 | Discharge status: Against Medical Advice | Attending: Emergency Medicine | Admitting: Emergency Medicine

## 2024-05-03 ENCOUNTER — Encounter (HOSPITAL_COMMUNITY): Payer: Self-pay

## 2024-05-03 ENCOUNTER — Other Ambulatory Visit: Payer: Self-pay

## 2024-05-03 DIAGNOSIS — Z5321 Procedure and treatment not carried out due to patient leaving prior to being seen by health care provider: Secondary | ICD-10-CM | POA: Insufficient documentation

## 2024-05-03 DIAGNOSIS — M545 Low back pain, unspecified: Secondary | ICD-10-CM | POA: Diagnosis not present

## 2024-05-03 DIAGNOSIS — G8929 Other chronic pain: Secondary | ICD-10-CM | POA: Diagnosis not present

## 2024-05-03 NOTE — ED Triage Notes (Signed)
 Pt arrived via POV c/o lumbar back pain. Pt reports Hx of chronic back pain from a fall off of a ladder 2020 and has received treatment in the past. Pt reports last week he was helping his mother move furniture and possibly re-injured his back.

## 2024-05-04 DIAGNOSIS — F332 Major depressive disorder, recurrent severe without psychotic features: Secondary | ICD-10-CM | POA: Diagnosis not present

## 2024-05-08 DIAGNOSIS — F332 Major depressive disorder, recurrent severe without psychotic features: Secondary | ICD-10-CM | POA: Diagnosis not present

## 2024-05-10 DIAGNOSIS — F332 Major depressive disorder, recurrent severe without psychotic features: Secondary | ICD-10-CM | POA: Diagnosis not present

## 2024-05-15 DIAGNOSIS — F332 Major depressive disorder, recurrent severe without psychotic features: Secondary | ICD-10-CM | POA: Diagnosis not present

## 2024-05-16 ENCOUNTER — Ambulatory Visit: Admitting: Orthopedic Surgery

## 2024-05-17 DIAGNOSIS — F332 Major depressive disorder, recurrent severe without psychotic features: Secondary | ICD-10-CM | POA: Diagnosis not present

## 2024-05-21 DIAGNOSIS — F331 Major depressive disorder, recurrent, moderate: Secondary | ICD-10-CM | POA: Diagnosis not present

## 2024-05-22 DIAGNOSIS — F332 Major depressive disorder, recurrent severe without psychotic features: Secondary | ICD-10-CM | POA: Diagnosis not present

## 2024-05-25 DIAGNOSIS — F332 Major depressive disorder, recurrent severe without psychotic features: Secondary | ICD-10-CM | POA: Diagnosis not present

## 2024-06-18 ENCOUNTER — Ambulatory Visit: Admitting: Orthopaedic Surgery

## 2024-07-12 ENCOUNTER — Ambulatory Visit: Admitting: Orthopaedic Surgery
